# Patient Record
Sex: Female | Born: 1960 | Race: White | Hispanic: No | Marital: Married | Smoking: Never smoker
Health system: Southern US, Community
[De-identification: ages and names within clinical notes are randomized; demographics above are authoritative.]

## PROBLEM LIST (undated history)

## (undated) DIAGNOSIS — E079 Disorder of thyroid, unspecified: Secondary | ICD-10-CM

## (undated) DIAGNOSIS — T4145XA Adverse effect of unspecified anesthetic, initial encounter: Secondary | ICD-10-CM

## (undated) DIAGNOSIS — E039 Hypothyroidism, unspecified: Secondary | ICD-10-CM

## (undated) DIAGNOSIS — E785 Hyperlipidemia, unspecified: Secondary | ICD-10-CM

## (undated) DIAGNOSIS — G5600 Carpal tunnel syndrome, unspecified upper limb: Secondary | ICD-10-CM

## (undated) DIAGNOSIS — T8859XA Other complications of anesthesia, initial encounter: Secondary | ICD-10-CM

## (undated) DIAGNOSIS — M5020 Other cervical disc displacement, unspecified cervical region: Secondary | ICD-10-CM

## (undated) DIAGNOSIS — G47419 Narcolepsy without cataplexy: Secondary | ICD-10-CM

## (undated) HISTORY — PX: MM BREAST STEREO BX*R*R/S: HXRAD496

## (undated) HISTORY — PX: SHOULDER SURGERY: SHX246

## (undated) HISTORY — PX: REDUCTION MAMMAPLASTY: SUR839

## (undated) HISTORY — PX: BREAST BIOPSY: SHX20

## (undated) HISTORY — DX: Hypothyroidism, unspecified: E03.9

## (undated) HISTORY — DX: Hyperlipidemia, unspecified: E78.5

## (undated) HISTORY — DX: Other cervical disc displacement, unspecified cervical region: M50.20

## (undated) HISTORY — DX: Narcolepsy without cataplexy: G47.419

## (undated) HISTORY — DX: Carpal tunnel syndrome, unspecified upper limb: G56.00

## (undated) HISTORY — PX: CERVICAL FUSION: SHX112

---

## 1999-07-07 ENCOUNTER — Other Ambulatory Visit: Admission: RE | Admit: 1999-07-07 | Discharge: 1999-07-07 | Payer: Self-pay | Admitting: Plastic Surgery

## 1999-07-07 ENCOUNTER — Encounter (INDEPENDENT_AMBULATORY_CARE_PROVIDER_SITE_OTHER): Payer: Self-pay

## 2000-04-26 ENCOUNTER — Other Ambulatory Visit: Admission: RE | Admit: 2000-04-26 | Discharge: 2000-04-26 | Payer: Self-pay | Admitting: Gynecology

## 2000-05-14 ENCOUNTER — Other Ambulatory Visit: Admission: RE | Admit: 2000-05-14 | Discharge: 2000-05-14 | Payer: Self-pay | Admitting: Gynecology

## 2000-05-14 ENCOUNTER — Encounter (INDEPENDENT_AMBULATORY_CARE_PROVIDER_SITE_OTHER): Payer: Self-pay

## 2001-08-19 ENCOUNTER — Emergency Department (HOSPITAL_COMMUNITY): Admission: EM | Admit: 2001-08-19 | Discharge: 2001-08-19 | Payer: Self-pay | Admitting: Emergency Medicine

## 2002-11-30 ENCOUNTER — Encounter: Admission: RE | Admit: 2002-11-30 | Discharge: 2002-11-30 | Payer: Self-pay | Admitting: Gynecology

## 2003-03-02 ENCOUNTER — Emergency Department (HOSPITAL_COMMUNITY): Admission: EM | Admit: 2003-03-02 | Discharge: 2003-03-02 | Payer: Self-pay | Admitting: Emergency Medicine

## 2005-07-17 ENCOUNTER — Emergency Department (HOSPITAL_COMMUNITY): Admission: EM | Admit: 2005-07-17 | Discharge: 2005-07-17 | Payer: Self-pay | Admitting: Family Medicine

## 2007-05-05 ENCOUNTER — Ambulatory Visit (HOSPITAL_COMMUNITY): Admission: RE | Admit: 2007-05-05 | Discharge: 2007-05-05 | Payer: Self-pay | Admitting: Orthopedic Surgery

## 2007-06-03 ENCOUNTER — Encounter: Admission: RE | Admit: 2007-06-03 | Discharge: 2007-06-03 | Payer: Self-pay | Admitting: Internal Medicine

## 2007-08-13 ENCOUNTER — Emergency Department (HOSPITAL_COMMUNITY): Admission: EM | Admit: 2007-08-13 | Discharge: 2007-08-13 | Payer: Self-pay | Admitting: Family Medicine

## 2007-09-15 ENCOUNTER — Ambulatory Visit (HOSPITAL_BASED_OUTPATIENT_CLINIC_OR_DEPARTMENT_OTHER): Admission: RE | Admit: 2007-09-15 | Discharge: 2007-09-15 | Payer: Self-pay | Admitting: Orthopedic Surgery

## 2007-09-20 ENCOUNTER — Encounter: Admission: RE | Admit: 2007-09-20 | Discharge: 2007-10-18 | Payer: Self-pay | Admitting: Orthopedic Surgery

## 2007-11-30 ENCOUNTER — Encounter: Admission: RE | Admit: 2007-11-30 | Discharge: 2008-01-16 | Payer: Self-pay | Admitting: Sports Medicine

## 2007-12-06 ENCOUNTER — Ambulatory Visit (HOSPITAL_COMMUNITY): Admission: RE | Admit: 2007-12-06 | Discharge: 2007-12-06 | Payer: Self-pay | Admitting: Sports Medicine

## 2008-01-16 ENCOUNTER — Ambulatory Visit (HOSPITAL_COMMUNITY): Admission: RE | Admit: 2008-01-16 | Discharge: 2008-01-17 | Payer: Self-pay | Admitting: Neurosurgery

## 2008-05-12 ENCOUNTER — Emergency Department (HOSPITAL_COMMUNITY): Admission: EM | Admit: 2008-05-12 | Discharge: 2008-05-12 | Payer: Self-pay | Admitting: Family Medicine

## 2009-01-24 ENCOUNTER — Ambulatory Visit (HOSPITAL_COMMUNITY): Admission: RE | Admit: 2009-01-24 | Discharge: 2009-01-24 | Payer: Self-pay | Admitting: Neurosurgery

## 2009-01-31 ENCOUNTER — Ambulatory Visit (HOSPITAL_COMMUNITY): Admission: RE | Admit: 2009-01-31 | Discharge: 2009-02-01 | Payer: Self-pay | Admitting: Neurosurgery

## 2009-09-09 ENCOUNTER — Observation Stay (HOSPITAL_COMMUNITY): Admission: EM | Admit: 2009-09-09 | Discharge: 2009-09-10 | Payer: Self-pay | Admitting: Emergency Medicine

## 2009-09-09 ENCOUNTER — Emergency Department (HOSPITAL_COMMUNITY): Admission: EM | Admit: 2009-09-09 | Discharge: 2009-09-09 | Payer: Self-pay | Admitting: Family Medicine

## 2009-09-09 ENCOUNTER — Encounter (INDEPENDENT_AMBULATORY_CARE_PROVIDER_SITE_OTHER): Payer: Self-pay | Admitting: General Surgery

## 2009-09-19 HISTORY — PX: APPENDECTOMY: SHX54

## 2010-02-08 ENCOUNTER — Encounter: Payer: Self-pay | Admitting: Gynecology

## 2010-04-03 LAB — DIFFERENTIAL
Basophils Absolute: 0 10*3/uL (ref 0.0–0.1)
Basophils Relative: 0 % (ref 0–1)
Eosinophils Absolute: 0 10*3/uL (ref 0.0–0.7)
Eosinophils Relative: 0 % (ref 0–5)
Lymphocytes Relative: 13 % (ref 12–46)
Lymphs Abs: 2.8 10*3/uL (ref 0.7–4.0)
Monocytes Absolute: 0.9 10*3/uL (ref 0.1–1.0)
Monocytes Relative: 4 % (ref 3–12)
Neutro Abs: 17.8 10*3/uL — ABNORMAL HIGH (ref 1.7–7.7)
Neutrophils Relative %: 83 % — ABNORMAL HIGH (ref 43–77)

## 2010-04-03 LAB — POCT I-STAT, CHEM 8
BUN: 10 mg/dL (ref 6–23)
Calcium, Ion: 1.17 mmol/L (ref 1.12–1.32)
Chloride: 105 mEq/L (ref 96–112)
Creatinine, Ser: 0.8 mg/dL (ref 0.4–1.2)
Glucose, Bld: 158 mg/dL — ABNORMAL HIGH (ref 70–99)
HCT: 50 % — ABNORMAL HIGH (ref 36.0–46.0)
Hemoglobin: 17 g/dL — ABNORMAL HIGH (ref 12.0–15.0)
Potassium: 4 mEq/L (ref 3.5–5.1)
Sodium: 138 mEq/L (ref 135–145)
TCO2: 23 mmol/L (ref 0–100)

## 2010-04-03 LAB — AMYLASE: Amylase: 387 U/L — ABNORMAL HIGH (ref 0–105)

## 2010-04-03 LAB — CBC
HCT: 44.5 % (ref 36.0–46.0)
Hemoglobin: 15.7 g/dL — ABNORMAL HIGH (ref 12.0–15.0)
MCH: 31.3 pg (ref 26.0–34.0)
MCHC: 35.3 g/dL (ref 30.0–36.0)
MCV: 88.6 fL (ref 78.0–100.0)
Platelets: 336 10*3/uL (ref 150–400)
RBC: 5.02 MIL/uL (ref 3.87–5.11)
RDW: 12.8 % (ref 11.5–15.5)
WBC: 21.6 10*3/uL — ABNORMAL HIGH (ref 4.0–10.5)

## 2010-04-03 LAB — POCT URINALYSIS DIPSTICK
Bilirubin Urine: NEGATIVE
Glucose, UA: NEGATIVE mg/dL
Ketones, ur: NEGATIVE mg/dL
Nitrite: NEGATIVE
Protein, ur: NEGATIVE mg/dL
Specific Gravity, Urine: 1.01 (ref 1.005–1.030)
Urobilinogen, UA: 0.2 mg/dL (ref 0.0–1.0)
pH: 6 (ref 5.0–8.0)

## 2010-04-03 LAB — LIPASE, BLOOD: Lipase: 23 U/L (ref 11–59)

## 2010-04-03 LAB — POCT PREGNANCY, URINE: Preg Test, Ur: NEGATIVE

## 2010-04-06 LAB — CBC
HCT: 40.5 % (ref 36.0–46.0)
Hemoglobin: 14.1 g/dL (ref 12.0–15.0)
MCHC: 34.9 g/dL (ref 30.0–36.0)
MCV: 90.3 fL (ref 78.0–100.0)
Platelets: 283 10*3/uL (ref 150–400)
RBC: 4.48 MIL/uL (ref 3.87–5.11)
RDW: 13.1 % (ref 11.5–15.5)
WBC: 10.6 10*3/uL — ABNORMAL HIGH (ref 4.0–10.5)

## 2010-06-03 NOTE — Op Note (Signed)
Alice Jones, Alice Jones            ACCOUNT NO.:  [id number]   MEDICAL RECORD NO.:  [id number]          PATIENT TYPE:  AMB   LOCATION:  DSC                          FACILITY:  [facility]   PHYSICIAN:  Loreta Ave, M.D. DATE OF BIRTH:  08-23-1960   DATE OF PROCEDURE:  09/15/2007  DATE OF DISCHARGE:                               OPERATIVE REPORT   PREOPERATIVE DIAGNOSES:  Right shoulder impingement, distal clavicle  osteolysis.   POSTOPERATIVE DIAGNOSES:  Right shoulder impingement, distal clavicle  osteolysis with anterior labrum tear and partial tearing of  supraspinatus tendon crescent region above and below.  No full-thickness  tears.   PROCEDURE:  Right shoulder exam under anesthesia, arthroscopy,  debridement of labrum and rotator cuff, bursectomy, acromioplasty, CA  ligament release.  Excision of distal clavicle.   SURGEON:  Loreta Ave, MD   ASSISTANT:  Genene Churn. Barry Dienes, [place]   ANESTHESIA:  General.   BLOOD LOSS:  Minimal.   SPECIMENS:  None.   CULTURES:  None.   COMPLICATIONS:  None.   DRESSING:  Soft compressive with sling.   PROCEDURE:  The patient was brought to the operating room and placed on  operating table in supine position.  After adequate anesthesia had been  obtained, shoulder examined.  Full motion and good stability.  Noted to  have a mobile lipoma in the axillary region.  Placed in beach-chair  position and the shoulder positioner, prepped and draped in usual  sterile fashion.  Three portals anterior, posterior, and lateral.  Shoulder entered with blunt obturator.  Arthroscope introduced.  The  shoulder distended and inspected.  Fair amount of partial tearing  undersurface supraspinatus tendon crescent region mostly in the front.  Debrided.  Some thinning in that area, but no full-thickness tears.  Cable well anchored.  Little fraying on the top of subscap bilaterally  debrided.  Biceps tendon, biceps anchor, and capsule ligamentous  structures  intact.  Fraying tearing at the  ligament debrided down  smoothly.  No other significant degenerative changes.  Cannula  redirected subacromially.  A lot of abrasion on top of the cuff  supraspinatus all debrided.  Again, no full-thickness tears.  Type 3  acromion.  Grade 4 changes of AC joint with spurring.  Bursa resected.  Acromioplasty to a type 1 acromion with shaver and high-speed bur.  CA  ligament released with cautery.  Distal clavicle exposed.  Lateral  centimeter of clavicle and all periarticular spurs removed.  At completion, adequacy of decompression, cuff debridement, clavicle  excision confirmed viewing from all portals.  Instruments and fluid  removed.  Portals all closed with nylon.  Sterile compressive dressing  applied.  Sling applied.  Anesthesia reversed.  Brought to the room.  Tolerated the surgery well.  No complications.      Loreta Ave, M.D.  Electronically Signed     DFM/MEDQ  D:  09/15/2007  T:  09/16/2007  Job:  [id number]

## 2010-06-03 NOTE — Op Note (Signed)
Alice Jones, Alice Jones            ACCOUNT NO.:  [id number]   MEDICAL RECORD NO.:  [id number]          PATIENT TYPE:  OIB   LOCATION:  3537                         FACILITY:  [facility]   PHYSICIAN:  Hewitt Shorts, M.D.DATE OF BIRTH:  1960-03-16   DATE OF PROCEDURE:  01/16/2008  DATE OF DISCHARGE:                               OPERATIVE REPORT   PREOPERATIVE DIAGNOSES:  C5-C6 and C6-C7 cervical disk herniation,  cervical spondylosis, cervical degenerative disk disease with cervical  radiculopathy.   POSTOPERATIVE DIAGNOSES:  C5-C6 and C6-C7 cervical disk herniation,  cervical spondylosis, cervical degenerative disk disease with cervical  radiculopathy.   PROCEDURE:  C5-C6 and C6-C7 anterior cervical decompression and  arthrodesis with allograft and Tether cervical plating.   SURGEON:  Hewitt Shorts, MD.   ASSISTANT:  1. Nelia Shi. Webb Silversmith, RN  2. Coletta Memos, MD   ANESTHESIA:  General endotracheal.   INDICATIONS:  The patient is a 50 year old woman who presented with a  right cervical radiculopathy.  MRI revealed significant degenerative  disk disease followed with C5-C6 and C6-C7 with a small spinal spur to  the right at C5-6 with superimposed soft disk herniation and a large  broad-based spinal disk herniation at C6-7 with moderate stenosis, worst  to the right than to the left.  A decision was made to proceed with 2-  level anterior cervical decompression and arthrodesis.   PROCEDURE:  The patient was brought to the operating room and placed  under general endotracheal anesthesia.  The patient was placed in 10  pounds of Holter traction.  The neck was prepped with Betadine soap and  solution and draped in sterile fashion.  A horizontal incision was made  in the left side of the neck.  The line of the incision was infiltrated  with local anesthetic with epinephrine and incision was made, carried  down through the subcutaneous tissue with bipolar electrocautery.  Electrocautery was used to maintain hemostasis.  Dissection was carried  down to the subcutaneous tissue and platysma and then dissection was  carried out through an avascular plane leaving the sternocleidomastoid,  carotid artery, and jugular vein laterally and the trachea and esophagus  medially.  The ventral aspect of the vertebral column was identified and  x-rays were taken and the C5-C6 and C6-C7 intervertebral disk spaces  identified.  Diskectomy was begun with incision of the annulus continued  with microcurettes and pituitary rongeurs.  Anterior osteophytic  overgrowth was removed using the osteophyte removal tool, the X-Max  drill, and Kerrison punches.  The degenerated disk material was removed  using microcurettes and pituitary rongeurs. The microscope was draped  and brought into the field to provide additional navigation,  illumination, and visualization.  The remainder of the decompression  performed using microdissection and microsurgical technique. The  cauterized endplates of the vertebral bodies were removed using  microcurettes along with the X-Max drill and then posterior osteophytic  overgrowth was removed using the X-Max drill along with a 2-mm Kerrison  punch with the thin foot plates.  There was significant spinal  overgrowth and as this was removed, we were able to  decompress the  spinal canal and thecal sac.  We then turned our attention to the  neuroforamina.  There was spinal encroachment of the neuroforamina  bilaterally at each level, and this was carefully removed and as this  was done, we encountered the soft disk herniation to the right at C5-C6.  This was removed as well and we were able to decompress the thecal sac  and spinal canal as well as the neuroforamina and nerve roots  bilaterally at each level.  Once the decompression was completed,  hemostasis was established with the use of Gelfoam soaked in thrombin.  We then measured the height of the  intervertebral disk spaces and  selected two 7-mm autograft implants.  They were hydrated with saline  solutions and positioned in the intervertebral disk spaces and  countersunk.  The cervical traction was then discontinued.  We selected  a 32-mm Tether cervical plate.  It was positioned over fusion construct  and secured to the vertebra with 4- x 14-mm variable angle screws.  Each  of the screw holes was drilled.  Screws placed in alternating fashion.  Once all 5 screws were in place, final tightening was performed.  The  wound was irrigated with Bacitracin solution and checked for hemostasis,  which was established and confirmed and x-ray was taken which showed the  plate and screws in good position at the C5-C6 level along with bone  graft at the C5-C6 level.  The C6-7 level could not be well visualized  due to her shoulders.  Nonetheless, under direct visualization, the bone  graft and screws were in good position.  Once the hemostasis was  confirmed, we proceeded with closure.  The platysma was closed with  interrupted inverted 2-0 undyed Vicryl sutures.  The subcutaneous and  subcuticular were closed with interrupted inverted 3-0 undyed Vicryl  sutures.  The skin was re-approximated with Dermabond.  The procedure  was tolerated well.  Estimated blood loss was 25 mL.  Sponge and needle  count were correct.  Following surgery, the patient was reversed from  the anesthetic, extubated, and transferred to the recovery room for  further care.      Hewitt Shorts, M.D.  Electronically Signed     RWN/MEDQ  D:  01/16/2008  T:  01/17/2008  Job:  [id number]

## 2010-06-27 ENCOUNTER — Inpatient Hospital Stay (INDEPENDENT_AMBULATORY_CARE_PROVIDER_SITE_OTHER)
Admission: RE | Admit: 2010-06-27 | Discharge: 2010-06-27 | Disposition: A | Discharge status: Home / Self Care | Payer: 59 | Source: Ambulatory Visit | Attending: Emergency Medicine | Admitting: Emergency Medicine

## 2010-06-27 DIAGNOSIS — R112 Nausea with vomiting, unspecified: Secondary | ICD-10-CM

## 2010-06-27 LAB — POCT URINALYSIS DIP (DEVICE)
Bilirubin Urine: NEGATIVE
Glucose, UA: NEGATIVE mg/dL
Hgb urine dipstick: NEGATIVE
Ketones, ur: 15 mg/dL — AB
Leukocytes, UA: NEGATIVE
Nitrite: NEGATIVE
Protein, ur: NEGATIVE mg/dL
Specific Gravity, Urine: >=1.03 (ref 1.005–1.030)
Urobilinogen, UA: 0.2 mg/dL (ref 0.0–1.0)
pH: 5.5 (ref 5.0–8.0)

## 2010-06-27 LAB — POCT I-STAT, CHEM 8
BUN: 18 mg/dL (ref 6–23)
Calcium, Ion: 1.21 mmol/L (ref 1.12–1.32)
Chloride: 103 mEq/L (ref 96–112)
Creatinine, Ser: 1 mg/dL (ref 0.4–1.2)
Glucose, Bld: 109 mg/dL — ABNORMAL HIGH (ref 70–99)
HCT: 45 % (ref 36.0–46.0)
Hemoglobin: 15.3 g/dL — ABNORMAL HIGH (ref 12.0–15.0)
Potassium: 4 mEq/L (ref 3.5–5.1)
Sodium: 137 mEq/L (ref 135–145)
TCO2: 24 mmol/L (ref 0–100)

## 2010-07-01 ENCOUNTER — Ambulatory Visit (INDEPENDENT_AMBULATORY_CARE_PROVIDER_SITE_OTHER): Payer: 59 | Admitting: Internal Medicine

## 2010-07-01 DIAGNOSIS — E669 Obesity, unspecified: Secondary | ICD-10-CM

## 2010-07-01 DIAGNOSIS — E785 Hyperlipidemia, unspecified: Secondary | ICD-10-CM

## 2010-07-07 ENCOUNTER — Encounter: Payer: 59 | Attending: Endocrinology

## 2010-07-10 ENCOUNTER — Encounter: Payer: Self-pay | Admitting: Internal Medicine

## 2010-07-22 ENCOUNTER — Ambulatory Visit (INDEPENDENT_AMBULATORY_CARE_PROVIDER_SITE_OTHER): Payer: 59 | Admitting: Internal Medicine

## 2010-07-22 ENCOUNTER — Ambulatory Visit (HOSPITAL_BASED_OUTPATIENT_CLINIC_OR_DEPARTMENT_OTHER)
Admission: RE | Admit: 2010-07-22 | Discharge: 2010-07-22 | Disposition: A | Discharge status: Home / Self Care | Payer: 59 | Source: Ambulatory Visit | Attending: Internal Medicine | Admitting: Internal Medicine

## 2010-07-22 ENCOUNTER — Other Ambulatory Visit: Payer: Self-pay | Admitting: Internal Medicine

## 2010-07-22 ENCOUNTER — Other Ambulatory Visit (HOSPITAL_COMMUNITY)
Admission: RE | Admit: 2010-07-22 | Discharge: 2010-07-22 | Disposition: A | Discharge status: Home / Self Care | Payer: 59 | Source: Ambulatory Visit | Attending: Internal Medicine | Admitting: Internal Medicine

## 2010-07-22 DIAGNOSIS — Z23 Encounter for immunization: Secondary | ICD-10-CM

## 2010-07-22 DIAGNOSIS — Z1231 Encounter for screening mammogram for malignant neoplasm of breast: Secondary | ICD-10-CM | POA: Insufficient documentation

## 2010-07-22 DIAGNOSIS — Z01419 Encounter for gynecological examination (general) (routine) without abnormal findings: Secondary | ICD-10-CM | POA: Insufficient documentation

## 2010-07-22 DIAGNOSIS — IMO0001 Reserved for inherently not codable concepts without codable children: Secondary | ICD-10-CM

## 2010-07-22 DIAGNOSIS — J309 Allergic rhinitis, unspecified: Secondary | ICD-10-CM

## 2010-07-22 DIAGNOSIS — Z113 Encounter for screening for infections with a predominantly sexual mode of transmission: Secondary | ICD-10-CM

## 2010-07-22 DIAGNOSIS — Z1159 Encounter for screening for other viral diseases: Secondary | ICD-10-CM | POA: Insufficient documentation

## 2010-07-22 DIAGNOSIS — Z1272 Encounter for screening for malignant neoplasm of vagina: Secondary | ICD-10-CM

## 2010-07-29 ENCOUNTER — Encounter: Payer: 59 | Attending: Endocrinology

## 2010-07-30 NOTE — Progress Notes (Signed)
  Patient was seen on 07/29/10 for the second of a series of three diabetes self-management courses at the [facility]. The following learning objectives were met by the patient during this course:   States the relationship of exercise to blood glucose  States benefits/barriers of regular and safe exercise  States three guidelines for safe and effective exercise  Describes personal diabetes medicine regimen  Describes actions of own medications  Describes causes, symptoms, and treatment of hypo/hyperglycemia  Describes sick day rules  Identifies when to test urine for ketones when appropriate  Identifies when to call healthcare provider for acute complications  States the risk for problems with foot, skin, and dental care  States preventative foot, skin, and dental care measures  States when to call healthcare provider regarding foot, skin, and dental care  Identifies methods for evaluation of diabetes control  Discusses benefits of SBGM  Identifies relationship between nutrition, exercise, medication, and glucose levels  Discusses the importance of record keeping  Follow-Up Plan: Patient will attend the final class of the ADA Diabetes Self-Care Education.

## 2010-08-06 NOTE — Progress Notes (Signed)
  Patient was seen on 08/05/2010 for the third of a series of three diabetes self-management courses at the [facility]. The following learning objectives were met by the patient during this course:   Identifies nutrient effects on glycemia  States the general guidelines of meal planning  Relates understanding of personal meal plan  Describes situations that cause stress and discuss methods of stress management  Identifies lifestyle behaviors for change  Your patient has established the following 3 month goal for diabetes self-care:  Walk /bike/swim/Aerobic exercise 4-5 times per week for 30 minutes  Follow-Up Plan: Patient will attend a free 3 month follow-up visit for diabetes self-management education.

## 2010-09-10 ENCOUNTER — Other Ambulatory Visit: Payer: Self-pay | Admitting: Internal Medicine

## 2010-10-24 LAB — CBC
HCT: 41.4 % (ref 36.0–46.0)
Hemoglobin: 14.2 g/dL (ref 12.0–15.0)
MCHC: 34.3 g/dL (ref 30.0–36.0)
MCV: 89.9 fL (ref 78.0–100.0)
Platelets: 332 10*3/uL (ref 150–400)
RBC: 4.6 MIL/uL (ref 3.87–5.11)
RDW: 13.1 % (ref 11.5–15.5)
WBC: 11.4 10*3/uL — ABNORMAL HIGH (ref 4.0–10.5)

## 2010-11-06 ENCOUNTER — Ambulatory Visit: Payer: 59 | Admitting: Internal Medicine

## 2010-11-13 ENCOUNTER — Ambulatory Visit (INDEPENDENT_AMBULATORY_CARE_PROVIDER_SITE_OTHER): Payer: 59 | Admitting: Internal Medicine

## 2010-11-13 ENCOUNTER — Encounter: Payer: Self-pay | Admitting: Internal Medicine

## 2010-11-13 DIAGNOSIS — Z9889 Other specified postprocedural states: Secondary | ICD-10-CM | POA: Insufficient documentation

## 2010-11-13 DIAGNOSIS — M5020 Other cervical disc displacement, unspecified cervical region: Secondary | ICD-10-CM | POA: Insufficient documentation

## 2010-11-13 DIAGNOSIS — H409 Unspecified glaucoma: Secondary | ICD-10-CM

## 2010-11-13 DIAGNOSIS — J309 Allergic rhinitis, unspecified: Secondary | ICD-10-CM

## 2010-11-13 DIAGNOSIS — H4020X0 Unspecified primary angle-closure glaucoma, stage unspecified: Secondary | ICD-10-CM

## 2010-11-13 DIAGNOSIS — E785 Hyperlipidemia, unspecified: Secondary | ICD-10-CM | POA: Insufficient documentation

## 2010-11-13 DIAGNOSIS — E119 Type 2 diabetes mellitus without complications: Secondary | ICD-10-CM | POA: Insufficient documentation

## 2010-11-13 MED ORDER — MOMETASONE FUROATE 50 MCG/ACT NA SUSP: NASAL | Status: DC

## 2010-11-13 NOTE — Patient Instructions (Signed)
Take nasonex daily for the next 2 months  Return prn

## 2010-11-13 NOTE — Progress Notes (Signed)
Subjective:    Patient ID: Alice Jones, female    DOB: Feb 03, 1960, 50 y.o.   MRN: [id number]  HPI  Alice Jones is here for a return visit.  She has seen Dr. Talmage Nap who is controlling her diabetes.  She is on 2000 mg of Metformin a day and highest glucose has been around 163 after a meal.  She has seen Dr. Dione Booze for eye exam and told she had narrow angle glaucoma.    Her allergies have been bothering her this fall and she is having some posterior pharyngeal drainage.  No sore throat or ear pain .      Allergies  Allergen Reactions  . Victoza     nausea   Past Medical History  Diagnosis Date  . Herniated cervical disc   . Allergic rhinitis   . Diabetes mellitus     Dr. Talmage Nap  . Hyperlipemia    Past Surgical History  Procedure Date  . Shoulder surgery     Right  . Appendectomy 09/2009  . Cervical fusion 12/09, 12/10    C6/7, C5/6   History   Social History  . Marital Status: Single    Spouse Name: N/A    Number of Children: 1  . Years of Education: law degree   Occupational History  . lawyer    Social History Main Topics  . Smoking status: Never Smoker   . Smokeless tobacco: Not on file  . Alcohol Use: No  . Drug Use: No  . Sexually Active: Yes -- Female partner(s)   Other Topics Concern  . Not on file   Social History Narrative  . No narrative on file   Family History  Problem Relation Age of Onset  . Cancer Mother     tongue  . Diabetes Maternal Grandfather    Patient Active Problem List  Diagnoses  . Allergic rhinitis  . Diabetes mellitus  . Hyperlipemia  . Herniated cervical disc  . Glaucoma, narrow-angle   Current Outpatient Prescriptions on File Prior to Visit  Medication Sig Dispense Refill  . diphenhydrAMINE (BENADRYL) 25 MG tablet Take 25 mg by mouth at bedtime.        . metFORMIN (GLUCOPHAGE) 1000 MG tablet Take 1,000 mg by mouth 2 (two) times daily with a meal.        . mometasone (NASONEX) 50 MCG/ACT nasal spray Spray once into each  nostril daily  17 g  1  . montelukast (SINGULAIR) 10 MG tablet Take 10 mg by mouth at bedtime.          Review of Systems    see hpi Objective:   Physical Exam  Physical Exam  Nursing note and vitals reviewed.  Constitutional: She is oriented to person, place, and time. She appears well-developed and well-nourished.  HENT: TM  No effusions  Oropharynx no lesions Head: Normocephalic and atraumatic.  Cardiovascular: Normal rate and regular rhythm. Exam reveals no gallop and no friction rub.  No murmur heard.  Pulmonary/Chest: Breath sounds normal. She has no wheezes. She has no rales.  Neurological: She is alert and oriented to person, place, and time.  Skin: Skin is warm and dry.  Psychiatric: She has a normal mood and affect. Her behavior is normal.         Assessment & Plan:  1)  Allergic rhinitis:  Will refill nasonex 2)  DM  She declines flu vaccine today despite my recommending it  Especially in setting or diabetes.  Diabetes care Dr. Talmage Nap  Has seen Dr. Dione Booze 3)  Mild hyperlipidemia  Low fat diet. 4)  Narrow angle glaucoma   Dr. Dione Booze

## 2011-05-21 ENCOUNTER — Other Ambulatory Visit: Payer: Self-pay | Admitting: Internal Medicine

## 2011-07-06 ENCOUNTER — Other Ambulatory Visit: Payer: Self-pay | Admitting: *Deleted

## 2011-07-07 MED ORDER — MOMETASONE FUROATE 50 MCG/ACT NA SUSP: 1.0000 | Freq: Every day | NASAL | Status: DC

## 2011-08-29 ENCOUNTER — Emergency Department (INDEPENDENT_AMBULATORY_CARE_PROVIDER_SITE_OTHER)
Admission: EM | Admit: 2011-08-29 | Discharge: 2011-08-29 | Disposition: A | Discharge status: Home / Self Care | Payer: 59 | Source: Home / Self Care

## 2011-08-29 DIAGNOSIS — R42 Dizziness and giddiness: Secondary | ICD-10-CM

## 2011-08-29 HISTORY — DX: Disorder of thyroid, unspecified: E07.9

## 2011-08-29 MED ORDER — PROMETHAZINE HCL 25 MG PO TABS: 25.0000 mg | ORAL_TABLET | Freq: Four times a day (QID) | ORAL | Status: DC | PRN

## 2011-08-29 MED ORDER — MECLIZINE HCL 25 MG PO CHEW: 25.0000 mg | CHEWABLE_TABLET | Freq: Four times a day (QID) | ORAL | Status: DC | PRN

## 2011-08-29 MED ORDER — PROMETHAZINE HCL 25 MG/ML IJ SOLN
25.0000 mg | Freq: Four times a day (QID) | INTRAMUSCULAR | Status: DC | PRN
  Administered 2011-08-29: 25 mg via INTRAMUSCULAR

## 2011-08-29 NOTE — ED Notes (Signed)
Dizziness and nausea started yesterday without out relief of nausea from Zofran ODT

## 2011-08-29 NOTE — ED Provider Notes (Signed)
History     CSN: [id number]  Arrival date & time 08/29/11  6295   First MD Initiated Contact with Patient 08/29/11 1005      Chief Complaint  Patient presents with  . Dizziness  . Nausea  HPI Comments: Dizziness x 2 days Pt noticed feeling of persistent dizziness since yesterday evening.  Dizziness independent of position.  Has had feeling of room spinning. + Nausea and vomiting with episoded.  Emesis NBNB.  No abd pain, diarrhea, dysuria, increased urinary frequency.   No CP, palpitations, SOB, fever.  No recent infections or trauma.  No hearing loss.  This has never happened before.  No hemiparesis, confusion, slurred speech.  Pt states that she started synthroid 1 week ago.  Has not had any issues with medication thus far.     Past Medical History  Diagnosis Date  . Herniated cervical disc   . Allergic rhinitis   . Diabetes mellitus     Dr. Talmage Nap  . Hyperlipemia   . Thyroid disease     Past Surgical History  Procedure Date  . Shoulder surgery     Right  . Appendectomy 09/2009  . Cervical fusion 12/09, 12/10    C6/7, C5/6    Family History  Problem Relation Age of Onset  . Cancer Mother     tongue  . Diabetes Maternal Grandfather     History  Substance Use Topics  . Smoking status: Never Smoker   . Smokeless tobacco: Not on file  . Alcohol Use: No    OB History    Grav Para Term Preterm Abortions TAB SAB Ect Mult Living   1    1  1         Obstetric Comments   1 Adopted child      Review of Systems  All other systems reviewed and are negative.    Allergies  Liraglutide  Home Medications   Current Outpatient Rx  Name Route Sig Dispense Refill  . DIPHENHYDRAMINE HCL 25 MG PO TABS Oral Take 25 mg by mouth at bedtime.      Marland Kitchen METFORMIN HCL 1000 MG PO TABS Oral Take 1,000 mg by mouth 2 (two) times daily with a meal.      . MOMETASONE FUROATE 50 MCG/ACT NA SUSP Nasal Place 1 spray into the nose daily. 17 g 1  . MONTELUKAST SODIUM 10 MG PO  TABS Oral Take 10 mg by mouth at bedtime.        BP 108/74  Pulse 83  Temp 97.8 F (36.6 C) (Oral)  Resp 20  Ht 5\' 2"  (1.575 m)  Wt 160 lb 8 oz (72.802 kg)  BMI 29.36 kg/m2  SpO2 98%  Physical Exam  Constitutional: She appears well-developed and well-nourished.  HENT:  Head: Normocephalic and atraumatic.  Right Ear: External ear normal.  Left Ear: External ear normal.  Mouth/Throat: Oropharynx is clear and moist.  Eyes: Conjunctivae are normal. Pupils are equal, round, and reactive to light.  Neck: Normal range of motion. Neck supple.  Cardiovascular: Normal rate and regular rhythm.   Pulmonary/Chest: Effort normal and breath sounds normal.  Abdominal: Soft. Bowel sounds are normal.  Musculoskeletal: Normal range of motion.  Lymphadenopathy:    She has no cervical adenopathy.  Neurological: No cranial nerve deficit.       + horizontal nystagmus Dix hallpike positive.   Skin: Skin is warm.    ED Course  Procedures (including critical care time)  Labs Reviewed - No  data to display No results found.   1. Vertigo       MDM  Phenergan 25 IM x 1 for active vomiting.  Will rx meclizine.  Discussed general disease course and neuro red flags.  If sxs not improved in 24 hours, may consider increase in medication vs. Head CT to rule out intracranial etiology.  Handout given.      The patient and/or caregiver has been counseled thoroughly with regard to treatment plan and/or medications prescribed including dosage, schedule, interactions, rationale for use, and possible side effects and they verbalize understanding. Diagnoses and expected course of recovery discussed and will return if not improved as expected or if the condition worsens. Patient and/or caregiver verbalized understanding.             Floydene Flock, MD 08/29/11 1029

## 2011-09-01 ENCOUNTER — Ambulatory Visit (INDEPENDENT_AMBULATORY_CARE_PROVIDER_SITE_OTHER): Payer: 59 | Admitting: Internal Medicine

## 2011-09-01 ENCOUNTER — Encounter: Payer: Self-pay | Admitting: Internal Medicine

## 2011-09-01 VITALS — BP 138/84 | HR 73 | Temp 96.9°F | Resp 16 | Ht 62.0 in | Wt 161.0 lb

## 2011-09-01 DIAGNOSIS — R270 Ataxia, unspecified: Secondary | ICD-10-CM

## 2011-09-01 DIAGNOSIS — F29 Unspecified psychosis not due to a substance or known physiological condition: Secondary | ICD-10-CM

## 2011-09-01 DIAGNOSIS — R410 Disorientation, unspecified: Secondary | ICD-10-CM

## 2011-09-01 DIAGNOSIS — R42 Dizziness and giddiness: Secondary | ICD-10-CM

## 2011-09-01 DIAGNOSIS — R279 Unspecified lack of coordination: Secondary | ICD-10-CM

## 2011-09-01 MED ORDER — ONDANSETRON HCL 4 MG PO TABS: 4.0000 mg | ORAL_TABLET | Freq: Three times a day (TID) | ORAL | Status: AC | PRN

## 2011-09-01 NOTE — Progress Notes (Signed)
Subjective:    Patient ID: Alice Jones, female    DOB: 05-08-1960, 51 y.o.   MRN: [id number]  HPI   Marissa is here for acute visit.  She reports she had onset of dizziness and vomiting  4 days ago and was seen at Urgent Care dx with vertigo and given  antivert and phenergan.  Vomiting ceased but still has "wobbly" feeling when she walks and some nausea.  She does note Antivert makes her drowsy.    No visual changes, no slurred speech, no muscle weakness.  Still feels some disorientation at times recently.    Tingling limited to  hands only that she feels is her carpal tunnel.  She is concerned about confused thinking, and difficulty with word finding that began several months ago.     No chest tightness or pain, no Sob or palpitations.    Allergies  Allergen Reactions  . Liraglutide     nausea   Past Medical History  Diagnosis Date  . Herniated cervical disc   . Allergic rhinitis   . Diabetes mellitus     Dr. Talmage Nap  . Hyperlipemia   . Thyroid disease   . Hypothyroid   . Carpal tunnel syndrome    Past Surgical History  Procedure Date  . Shoulder surgery     Right  . Appendectomy 09/2009  . Cervical fusion 12/09, 12/10    C6/7, C5/6   History   Social History  . Marital Status: Single    Spouse Name: N/A    Number of Children: 1  . Years of Education: law degree   Occupational History  . lawyer    Social History Main Topics  . Smoking status: Never Smoker   . Smokeless tobacco: Not on file  . Alcohol Use: No  . Drug Use: No  . Sexually Active: Yes -- Female partner(s)   Other Topics Concern  . Not on file   Social History Narrative  . No narrative on file   Family History  Problem Relation Age of Onset  . Cancer Mother     tongue  . Diabetes Maternal Grandfather    Patient Active Problem List  Diagnosis  . Allergic rhinitis  . Diabetes mellitus  . Hyperlipemia  . Herniated cervical disc  . Glaucoma, narrow-angle   Current Outpatient  Prescriptions on File Prior to Visit  Medication Sig Dispense Refill  . diphenhydrAMINE (BENADRYL) 25 MG tablet Take 25 mg by mouth at bedtime.        Marland Kitchen levothyroxine (SYNTHROID, LEVOTHROID) 50 MCG tablet Take 50 mcg by mouth daily.      . Meclizine HCl 25 MG CHEW Chew 1 tablet (25 mg total) by mouth every 6 (six) hours as needed.  90 each  0  . metFORMIN (GLUCOPHAGE) 1000 MG tablet Take 1,000 mg by mouth 2 (two) times daily with a meal.        . mometasone (NASONEX) 50 MCG/ACT nasal spray Place 1 spray into the nose daily.  17 g  1  . promethazine (PHENERGAN) 25 MG tablet Take 1 tablet (25 mg total) by mouth every 6 (six) hours as needed for nausea.  30 tablet  0       Assessment & Plan:   ba  Review of Systems see HPI   Objective:   Physical Exam Physical Exam  Nursing note and vitals reviewed.  Constitutional: She is oriented to person, place, and time. She appears well-developed and well-nourished.  HENT: Fundi  No  papilledema Head: Normocephalic and atraumatic.  Cardiovascular: Normal rate and regular rhythm. Exam reveals no gallop and no friction rub.  No murmur heard.  Pulmonary/Chest: Breath sounds normal. She has no wheezes. She has no rales.  Neurological: She is alert and oriented to person, place, and time. Romberg neg. CN II-XII intact  No nystagmus Motor 5/5 upper and Lower extremities.  No pronator drift Cerebellar  Intact to FTN   HTS Reflexes 2+ symmetric Babinski neg Sensory intact  Skin: Skin is warm and dry.  Psychiatric: She has a normal mood and affect. Her behavior is normal.        Assessment & Plan:  Dizziness/ataxia/confusion:   will get Head CT with contast  Check chemistries CBC today  Presumed Vertigo:  See above  Very sensitive to drowsiness of meds Ok to take Antiver 12.5 mg q8h prn  Nausea  Rx Zofran 4 mg q8h prn nausea

## 2011-09-01 NOTE — Patient Instructions (Addendum)
To have head Ct scheduled  Will need to check kidney function today  Any worsening problems go to er for xrays

## 2011-09-02 ENCOUNTER — Ambulatory Visit (HOSPITAL_COMMUNITY)
Admission: RE | Admit: 2011-09-02 | Discharge: 2011-09-02 | Disposition: A | Discharge status: Home / Self Care | Payer: 59 | Source: Ambulatory Visit | Attending: Internal Medicine | Admitting: Internal Medicine

## 2011-09-02 ENCOUNTER — Telehealth: Payer: Self-pay | Admitting: Internal Medicine

## 2011-09-02 ENCOUNTER — Telehealth: Payer: Self-pay | Admitting: *Deleted

## 2011-09-02 ENCOUNTER — Other Ambulatory Visit: Payer: Self-pay | Admitting: Internal Medicine

## 2011-09-02 DIAGNOSIS — R410 Disorientation, unspecified: Secondary | ICD-10-CM

## 2011-09-02 DIAGNOSIS — F29 Unspecified psychosis not due to a substance or known physiological condition: Secondary | ICD-10-CM | POA: Insufficient documentation

## 2011-09-02 DIAGNOSIS — R42 Dizziness and giddiness: Secondary | ICD-10-CM

## 2011-09-02 DIAGNOSIS — R270 Ataxia, unspecified: Secondary | ICD-10-CM

## 2011-09-02 DIAGNOSIS — R279 Unspecified lack of coordination: Secondary | ICD-10-CM | POA: Insufficient documentation

## 2011-09-02 LAB — COMPREHENSIVE METABOLIC PANEL
ALT: 16 U/L (ref 0–35)
CO2: 27 mEq/L (ref 19–32)
Calcium: 10 mg/dL (ref 8.4–10.5)
Chloride: 100 mEq/L (ref 96–112)
Potassium: 4.3 mEq/L (ref 3.5–5.3)
Sodium: 136 mEq/L (ref 135–145)
Total Bilirubin: 0.3 mg/dL (ref 0.3–1.2)
Total Protein: 7.1 g/dL (ref 6.0–8.3)

## 2011-09-02 LAB — CBC WITH DIFFERENTIAL/PLATELET
Lymphocytes Relative: 29 % (ref 12–46)
Lymphs Abs: 3.9 10*3/uL (ref 0.7–4.0)
Neutro Abs: 8.3 10*3/uL — ABNORMAL HIGH (ref 1.7–7.7)
Neutrophils Relative %: 63 % (ref 43–77)
Platelets: 356 10*3/uL (ref 150–400)
RBC: 4.97 MIL/uL (ref 3.87–5.11)
WBC: 13.3 10*3/uL — ABNORMAL HIGH (ref 4.0–10.5)

## 2011-09-02 LAB — TSH: TSH: 2.577 u[IU]/mL (ref 0.350–4.500)

## 2011-09-02 MED ORDER — IOHEXOL 300 MG/ML  SOLN
100.0000 mL | Freq: Once | INTRAMUSCULAR | Status: AC | PRN
  Administered 2011-09-02: 100 mL via INTRAVENOUS

## 2011-09-02 NOTE — Telephone Encounter (Signed)
Spoke with pt. Via phone and informed of labs.  Labs significant for mild elevated WBC but pt  Clinically does not point to any infectious etiology.  She denies URI, abd, Gi or GU symptoms.  She is going for CT today.  Advised to call in am for results.  She did go back to work today but still does not feel very well

## 2011-09-02 NOTE — Telephone Encounter (Signed)
Pt scheduled for Head CT with and WO contrast

## 2011-09-03 ENCOUNTER — Telehealth: Payer: Self-pay | Admitting: *Deleted

## 2011-09-03 ENCOUNTER — Telehealth: Payer: Self-pay | Admitting: Internal Medicine

## 2011-09-03 ENCOUNTER — Other Ambulatory Visit (HOSPITAL_COMMUNITY): Payer: 59

## 2011-09-03 DIAGNOSIS — R42 Dizziness and giddiness: Secondary | ICD-10-CM

## 2011-09-03 NOTE — Telephone Encounter (Signed)
Copy  Of pt's labs mailed to her home address.

## 2011-09-03 NOTE — ED Provider Notes (Signed)
Agree with exam, assessment, and plan.   Lattie Haw, MD 09/03/11 [phone]

## 2011-09-03 NOTE — Telephone Encounter (Signed)
Spoke with pt and informed of Head CT results.  She is feeling better today but still would like to discuss with ENT  Will refer to ENT

## 2011-09-15 ENCOUNTER — Encounter: Payer: Self-pay | Admitting: Internal Medicine

## 2011-09-15 DIAGNOSIS — H8120 Vestibular neuronitis, unspecified ear: Secondary | ICD-10-CM | POA: Insufficient documentation

## 2011-09-15 DIAGNOSIS — H8110 Benign paroxysmal vertigo, unspecified ear: Secondary | ICD-10-CM | POA: Insufficient documentation

## 2011-10-23 ENCOUNTER — Other Ambulatory Visit: Payer: Self-pay | Admitting: Internal Medicine

## 2012-01-15 ENCOUNTER — Ambulatory Visit: Payer: 59

## 2012-03-25 ENCOUNTER — Ambulatory Visit (INDEPENDENT_AMBULATORY_CARE_PROVIDER_SITE_OTHER): Payer: Self-pay | Admitting: Family Medicine

## 2012-03-25 VITALS — BP 135/84 | HR 83 | Ht 62.0 in | Wt 166.8 lb

## 2012-03-25 NOTE — Progress Notes (Signed)
Patient presents today for 3 month DM follow-up as part of the employer-sponsored [employer] to Wellness program. Medications and glucose readings have been reviewed. I have also discussed with patient lifestyle interventions such as healthy eating choices and physical activity. Details of this visit can be found in Care Tracker documenting program available through [facility]). Patient has set a series of personal goals and will follow-up in 3 months for further review of DM.   Megan D. Vivia Ewing, PharmD, BCPS

## 2012-04-19 NOTE — Progress Notes (Signed)
Patient ID: Alice Jones, female   DOB: [birth year], 52 y.o.   MRN: [id number] ATTENDING PHYSICIAN NOTE: I have reviewed the chart and agree with the plan as detailed above. Denny Levy MD Pager [phone]

## 2012-07-04 ENCOUNTER — Ambulatory Visit (INDEPENDENT_AMBULATORY_CARE_PROVIDER_SITE_OTHER): Payer: 59 | Admitting: Family Medicine

## 2012-07-04 VITALS — BP 128/80 | HR 78 | Ht 62.0 in | Wt 167.8 lb

## 2012-07-04 DIAGNOSIS — E119 Type 2 diabetes mellitus without complications: Secondary | ICD-10-CM

## 2012-07-04 NOTE — Progress Notes (Signed)
Subjective:  Patient presents today for 3 month diabetes follow-up as part of the employer-sponsored Link to Wellness program. Current diabetes regimen includes metformin and now starting Invokanna. Most recent MD follow-up was today, 07/01/12. Patient has a pending appt for October with repeat blood work for 6 weeks to recheck Synthroid dose. No major changes at this time.  Still worried about blood sugar readings going up and has gained 10 lbs in 6 months. Trying to be more restrictive on diet but struggling to restrict as much as she did before. Thyroid has worsened and per Dr. Talmage Nap, thinks this is affecting her efforts. Started on high protein, low carb diet about a week ago that includes shakes 2x/day and a high protein, low carb meal for the third meal. Reports more exercise hasn't helped. Plans to join gym to increase exercise.    Disease Assessments:  Diabetes:  Type of Diabetes: Type 2; uses glucometer; takes medications as prescribed; does not take an aspirin a day; hypoglycemia frequency never; checks blood glucose more than 4 times a day; checks feet daily; Year of diagnosis 2012; MD managing Diabetes Dr. Talmage Nap; Sees Diabetes provider 2 times per year;   Highest CBG 180; Lowest CBG 98; Other Diabetes History: Checking BG 5 times a day. Fasting, pre-meals, bedtime.   She rarely has a low blood sugar- lowest in 90s. Didn't bring meter in today. Saw Dr. Talmage Nap earlier today, started on Invokanna due to further in crease in A1c.      Social History:  Alcohol use: 1 - 2 drinks per week; Caffeine use: coffee soda May have couple sodas in mornings, switches to decaff sodas in evenings;   Medication adherence adherent; Exercise adherence 3-4 days a week; Diet adherence Greater than 75% of the time; Denies drug use; Patient knows the purpose/use of medications; Patient can afford medications; 130 minutes of exercise per week.   Nutrition-  Has been making protein shakes for 2 meals/day - little  fruit to flavor, some fiber, [employer] powder for vitmains and minerals  For the other meal - meat + nonstarchy vegetable  Estimates <60 g carbs/day - just under 60 though  Snacking on nuts and small amounts of dried fruit - always a snack before bed and couple of handfuls of nuts throughout the day  Drinks - diet sodas more than water  Physical Activity- 2-3 days/week - walking for 30 min-1hr and some weight training. Plans to join gym for more strength training.    Testing:  Blood Sugar Tests:  Hemoglobin A1c: 6.3    Historical Lab Results:  Total Cholesterol 167; LDL Cholesterol 79; Triglycerides 154; HDL Cholesterol 57  Saw Dr. Talmage Nap today, labs drawn last week.    Assessment/Plan:  Glycemic control has worsened slightly (up to 6.3%) but she is still meeting her goal of an A1C less than 7%. Patient is worried by this increase. She has also gained weight which may have also contributed to her A1C increase. She frequently checks her blood sugar. Her TSH was elevated, which could contribute to weight gain. Dr. Talmage Nap added Theodis Sato at today's visit. Pt counseled on risk of yeast infections and UTIs. Also explained possibility of weight loss with this addition.  Patient has continued consistent physical activity and is using the treadmill 2-3 times weekly in addition to ocaisional weight training. She doesn't think this has helped at all for weight loss. She doesn't believe it is helping with her blood sugar at all. Encouraged her to continue  due to decrease in insulin resistance and to aid weight loss efforts. Pt reports she plans to join a gym to get more consistent strength training.  Follow up with patient in 3 months.  Patient seen by pharmacy resident, Doris Cheadle, PharmD.    Teaching Goals:  Diabetes Mellitus:  Other Patient did not wish to set any goals for next visit.   Follow up in 3 months. Next appointment: October 3 at 3:00 pm

## 2012-10-10 NOTE — Progress Notes (Signed)
Patient ID: Alice Jones, female   DOB: [birth year], 52 y.o.   MRN: [id number] ATTENDING PHYSICIAN NOTE: I have reviewed the chart and agree with the plan as detailed above. Denny Levy MD Pager [phone]

## 2012-10-24 ENCOUNTER — Ambulatory Visit (INDEPENDENT_AMBULATORY_CARE_PROVIDER_SITE_OTHER): Payer: Self-pay | Admitting: Family Medicine

## 2012-10-24 VITALS — BP 137/71 | HR 82 | Ht 62.0 in | Wt 169.0 lb

## 2012-10-24 DIAGNOSIS — E119 Type 2 diabetes mellitus without complications: Secondary | ICD-10-CM

## 2012-10-24 NOTE — Progress Notes (Signed)
Subjective:  Patient presents today for 3 month diabetes follow-up as part of the employer-sponsored Link to Wellness program. Current diabetes regimen includes metformin 500 mg BID.   She has d/c Invokana. She was having some lower back pain and she was discontinued because she thought she might be having kidney issues. She continues with metformin BID.   She cut out zyrtec because her allergies were improving (she thought) and then she broke out in the "worse hives she has ever had". She started a steroid dose pack earlier this week. She reports that the hives have improved but she is still having some problems. She isn't sure what causes the hives. 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 She has gone back to taking benadryl 50 mg po at bedtime along with the claritin 10 mg at night. She states she is taking fexofenadine in the morning. She states she is taking 2 of the 180 mg strength in the morning. I recommended to her that she drop down to taking 1 of the 180 mg tablets.   She has started taking several supplements- alpha lipoic acid, vitamin D 3, insinase and fenugreek.  She has an appointment pending with Dr. Talmage Nap in a couple of weeks. Her last appointment with Dr. Talmage Nap was over the summer in June.  Overall she seems very frustrated that her blood sugars continue to be elevated and that she also continues to gain weight.    Disease Assessments:  Diabetes:  Type of Diabetes: Type 2; uses glucometer; takes medications as prescribed; does not take an aspirin a day; checks blood glucose more than 4 times a day; checks feet daily; Year of diagnosis 2012; MD managing Diabetes Dr. Talmage Nap; Sees Diabetes provider 2 times per year;   Highest CBG 196; Lowest CBG 96; hypoglycemia frequency denies; Other Diabetes History: Checking BG 5 times a day. Fasting, pre-meals, bedtime.   She did not bring her meter with her to the appointment. She had a  196 yesterday- she thinks this was because of the steroids.   Nutrition-  She is following the advice of the Blood Sugar Diet book. She has cut out all sugar and sugar substitutes. She is no longer drinking diet sodas (was drinking 6+ a day). She is also not drinking coffee.   After this diet she changed to another diet- the [employer]. She has cut out gluten completely, no sugar or sugar substitute, no peanuts, no soy, she is trying to cut out eggs but has found this difficult. She is eating animal based proteins.   She felt like before she went out these diets her blood sugars were always elevated. Now that she is on the diet her fasting CBGs are closer to 100. She also reports that her allergies have been improved. She states that she doesn't really feel any different and she is still tired.   She states that she tries to eat a meat plus a non starchy vegetable.   She is not eating much fruit- she is getting some in her morning shake and then she eats an apple at lunch.   24 hour recall-  B- homemade protein shake (245 kcal, 18 g CHO)  L- sandwich + apple (345 kcal, 52 g CHO)  snack- almonds (200 kcal, 7 g CHO)  Dinner- chili and apple (272 kcal, 37 g CHO)  Calorie estimates are based on the app that she tracks her food with. Yesterday's daily total was around  1000 kcal.   Physical Activity-  Some treadmill and walking. She states she is walking maybe once a week for 45 minutes.    Preventive Care:    Hemoglobin A1c: 07/01/2012    Dilated Eye Exam: 10/31/2012  Other Preventive Care Notes:  Dental exam- gets every 6 months.     Vital Signs:  10/21/2012 3:35 PM (EST) Blood Pressure 137 / 71 mm/HgBMI 30.9; Height 5 ft 2 in; Pulse Rate 82 bpm; Weight 169 lbs       Assessment/Plan: Patient is a 52 year old female with DM2. Most recent A1C in June was 6.3%. She has an appointment pending with Dr. Talmage Nap so I will defer A1C testing to her office.   Patient continues to  gain weight- today she was up nearly 2 pounds since her last appointment with me. Overall she has gained 10 pounds since May 2013. Patient has tried two new diets since her last appointment with me but she states that she hasn't felt any different. One diet she has only started in the past couple of weeks so it may be too early to tell if she will feel different.   Patient reports feeling tired and sluggish nearly all of the time. Patient admits to having a stressful job. Patient is also frustrated that she continues to gain weight and has so far been unable to lose weight. I asked her what she thought she could do to lose weight. Patient suggested that she further restrict calorie an carbohydrate intake. From patient's own estimate she is eating ~1000 kcal daily, and less than 45 g of carbohydrates with each meal. I told her that I did not think she needed to restrict her intake any further and suggested a dietitian referral. She agreed to see a dietitian and I am working to see if she can get in to Wyona Almas, RD with [facility].   I am skeptical of patient's new diet ([place] diet and blood sugar diet). Since I have been seeing this patient she has tried numerous diet strategies to control her blood sugar. She has been mostly successful in keeping her A1C at goal of less than 7%, but she has continually gained weight. She also continues to feel tired, sluggish and seems unhappy. I pointed out that maybe we needed to change her routines/habits as what she is doing now doesn't seem to make her feel any better. She does not want to take any additional medications because she has read that insulin is part of the problem and does not want to take any medications that will raise insulin levels.   Patient has been mostly unwilling to exercise in the past. I challenged her today to start regular physical activity. I explained that regular physical activity is one of the best ways to improve insulin  resistance and would also give her additional energy during the day.  Follow up with dietitian referral. I will see patient back in 3 months..   Goals for Next Visit-  1. Keep up with the [place] diet for 2 more weeks and then start re-introducing trigger foods.  2. Physical Activity- my challenge to you is to exercise for 150 minutes a week for 2 weeks and see how you feel.  3. Go to see the dietitian.  Next appointment to see me is Friday January 5th at 3 PM.

## 2012-12-01 ENCOUNTER — Ambulatory Visit: Payer: 59 | Admitting: Family Medicine

## 2012-12-08 ENCOUNTER — Encounter: Payer: Self-pay | Admitting: Family Medicine

## 2012-12-08 ENCOUNTER — Ambulatory Visit (INDEPENDENT_AMBULATORY_CARE_PROVIDER_SITE_OTHER): Payer: 59 | Admitting: Family Medicine

## 2012-12-08 VITALS — Ht 62.0 in | Wt 171.9 lb

## 2012-12-08 DIAGNOSIS — E785 Hyperlipidemia, unspecified: Secondary | ICD-10-CM

## 2012-12-08 DIAGNOSIS — E669 Obesity, unspecified: Secondary | ICD-10-CM

## 2012-12-08 DIAGNOSIS — E119 Type 2 diabetes mellitus without complications: Secondary | ICD-10-CM

## 2012-12-08 NOTE — Patient Instructions (Addendum)
-   Eat at least 3 meals and 1-2 snacks per day.  Aim for no more than 5 hours between eating.   - Know the restaurants/takeout options where you can get veg's at lunch time.    - If you start using soup, try adding frozen veg's.  - Include vegetables at both lunch and dinner.   - GOAL: Walk 30 min 3 X wk.    - Record your exercise weekly.    - If not enough time, do a 7-min high-intensity interval workout.  (Look up on the internet.)  - Email [email or web address] for Med in [employer] article on HIIT.   - Use the 5-min rule:  If after 5 min, you want to quite, you can!    - Advance planning will be key to making this work for you.     Follow-up appt:  Jan 19 at 3:30 PM.

## 2012-12-08 NOTE — Progress Notes (Signed)
Medical Nutrition Therapy:  Appt start time: 1330 end time:  1430.  Assessment:  Primary concerns today: Weight management, Blood sugar control and blood lipids management.  Alice Jones lost ~25 lb when first diagnosed w/ DM a couple yrs ago, but has regained ~10 lb in the past 6 months.  For a while she was eating only ~50 g of CHO/day, and she has started to eat more carb foods, but still not at high levels.   Usual eating pattern includes 3 meals and 1-2 snacks per day. 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  Obstacle to more exercise is lack of time.    Frequent foods include water, seltzer; breakfast shake; ffd burger (no bun) 3-4 X wk; sandw w/ GF bread, apple for lunch.  Avoided foods include milk foods, rice, pasta, all potatoes, and most gluten sources.  (Not diagnosed as gluten-sensitive, but feels digestion is better w/out it.)  [name] checks BG at fasting and sometimes later in the day; currently FBG is running 90's-110.    24-hr recall: (Up at 6:30 AM) B (8:10 AM)-   Shake (4 oz coconut milk, 4 oz kefir, fiber powder, protein powder (16 g), 1/2 banana, green tea extract) Snk ( AM)-    L (12;45)-  6" meatball sub, water Snk ( PM)-  Handful of nuts D (8:15 PM)-  2 [employer] beefy 5-layer burritos Snk ( PM)-  1 glass wine Yesterday was atypical in that she had fast food 2 X.  Dinner is usually a meat and 1-2 veg's.    Progress Towards Goal(s):  In progress.   Nutritional Diagnosis:  [place]-3.3 Overweight/obesity As related to dietary balance and inadequate exercise.  As evidenced by usual intake includes veg's only 1 X day, and no consistent exercise.    Intervention:  Nutrition education.  Monitoring/Evaluation:  Dietary intake, exercise, BG, and body weight in 8 week(s).  No sooner appt available.

## 2012-12-13 ENCOUNTER — Other Ambulatory Visit: Payer: Self-pay

## 2012-12-13 DIAGNOSIS — Z1231 Encounter for screening mammogram for malignant neoplasm of breast: Secondary | ICD-10-CM

## 2013-01-20 ENCOUNTER — Ambulatory Visit
Admission: RE | Admit: 2013-01-20 | Discharge: 2013-01-20 | Disposition: A | Discharge status: Home / Self Care | Payer: 59 | Source: Ambulatory Visit

## 2013-01-20 DIAGNOSIS — Z1231 Encounter for screening mammogram for malignant neoplasm of breast: Secondary | ICD-10-CM

## 2013-01-26 ENCOUNTER — Other Ambulatory Visit: Payer: Self-pay | Admitting: Internal Medicine

## 2013-01-26 DIAGNOSIS — R928 Other abnormal and inconclusive findings on diagnostic imaging of breast: Secondary | ICD-10-CM

## 2013-01-30 ENCOUNTER — Ambulatory Visit (INDEPENDENT_AMBULATORY_CARE_PROVIDER_SITE_OTHER): Payer: Self-pay | Admitting: Family Medicine

## 2013-01-30 VITALS — BP 132/80 | Ht 62.0 in | Wt 168.0 lb

## 2013-01-30 DIAGNOSIS — E119 Type 2 diabetes mellitus without complications: Secondary | ICD-10-CM

## 2013-01-30 NOTE — Progress Notes (Signed)
Subjective:  Patient presents today for 3 month diabetes follow-up as part of the employer-sponsored Link to Wellness program. Current diabetes regimen includes Metformin 500 mg ER in the morning and 1000 mg ER in the evening, Invokana 100 mg once daily.   Patient had a mammogram last week that showed an abnormality. She goes this next week for a follow up mammogram and possible ultrasound. Last visit with Dr. Chalmers Cater was October 17th and A1C was 5.8 %. She returns for a follow up appointment with her in April. She also had her thyroid level checked again in December but no changes were made to her medication.   She reports that she has started a new diet in the past few weeks.  She was taking several supplements to help lower her blood glucose and she has stopped taking them due to cost and she was unsure if they were really helping her at all.    Disease Assessments:  Diabetes:  Type of Diabetes: Type 2; uses glucometer; takes medications as prescribed; does not take an aspirin a day; checks feet daily; Year of diagnosis 2012; MD managing Diabetes Dr. Chalmers Cater; Sees Diabetes provider 2 times per year; checks blood glucose 3-4 times a day;   7 day CBG average 131; 14 day CBG average 129; 30 day CBG average 121; Highest CBG 187; Lowest CBG 85; hypoglycemia frequency rarely; Other Diabetes History: She states that her blood sugars have been lower. She feels like her blood sugar is really low, but it is only 80-90 when she checks.   She is checking her blood sugar 3-4 times daily.   Patient recently restarted Invokana 100 mg daily.   The majority of CBG readings are between 90-120.  A1C today was 5.5%.     Nutrition-  She was following the [place] but states that it was hard to follow all the restrictions that the diet required. She still estimates that she is eating a low calorie diet but is really focused on keeping a low carbohydrate diet. She states that during work it is challenging to get  a full lunch in and often eats a salad.   Now she is following the bulletproof diet and she is focusing on removing toxins from her diet. Toxins are considered as gluten, dairy, eggs. She states that she is trying to put more grass fed beef in her diet and healthy fats in her diet. She states that she has been trying to follow the diet but finds that it is hard to find grass fed beef when she eats out. She also states that the diet encourages butter to be added the coffee. She tried using coconut oil too because it was recommended by the diet but she couldn't handle it because it made her stomach upset. She states that the overall reach of the diet is to be in a fasting mode and use fat as a fuel.   She states that she does feel better since following the diet. She has cut out a lot of dairy but is still eating a little bit. She is overall still staying low carb. When she has a sandwich she is eating it on a small yeast roll or just 1 slice of bread. She isn't as strict as logging all of her food as she was previously.   She is meeting with Iver Nestle, RD with [facility] and she feels like the appointment was worthwhile.   Physical Activity-  Some treadmill and walking. She states  she is walking 2-3 times a week for 30 minutes. This is an increase from before when she was completing little to no physical activity each week.       Dilated Eye Exam: 10/31/2012  Other Preventive Care Notes:  Dental exam- gets every 6 months.       Vital Signs:  01/30/2013 11:44 AM (EST) Blood Pressure 132 / 80 mm/HgBMI 30.7; Height 5 ft 2 in; Weight 168 lbs    Testing:  Blood Sugar Tests:  Hemoglobin A1c: 5.5 resulted on 01/27/2013     Assessment/Plan: Patient is a 53 year old female with DM2. A1C today was 5.5% which is a drop from 5.8% from her last visit with Dr. Chalmers Cater 3 months ago. Patient's weight is unchanged since the last appointment. She states that she had lost a few pounds but gained the  weight back during the holidays. She started seeing a dietitian with the [facility] and she states that this has been helpful. She is on a new diet that seems like it is not quite as restrictive as the diet she was on previously. She admits that she feels better and feels fuller and more satisfied. Patient has also increased physical activity since her last appointment with me. CBGS have improved which matches the drop in A1C.   I feel like patient is doing well as evidenced by a lower A1C and increased physical activity. I agreed to see her in 6 months when I return from maternity leave, but follow up via email in 2 months to see how things are going.   I will fax A1C results to Dr. Almetta Lovely office.    Goals for Next Visit-  1. Continue working with your diet to figure out what foods you can eat.  2. Overall goal of weight loss.  3. Continue physical activity- keep trying to get 150 minutes.  Next appointment to see me is Friday July 24th at 3 PM.

## 2013-02-03 ENCOUNTER — Ambulatory Visit
Admission: RE | Admit: 2013-02-03 | Discharge: 2013-02-03 | Disposition: A | Discharge status: Home / Self Care | Payer: 59 | Source: Ambulatory Visit | Attending: Internal Medicine | Admitting: Internal Medicine

## 2013-02-03 ENCOUNTER — Other Ambulatory Visit: Payer: Self-pay | Admitting: Internal Medicine

## 2013-02-03 DIAGNOSIS — R928 Other abnormal and inconclusive findings on diagnostic imaging of breast: Secondary | ICD-10-CM

## 2013-02-03 DIAGNOSIS — N6310 Unspecified lump in the right breast, unspecified quadrant: Secondary | ICD-10-CM

## 2013-02-06 ENCOUNTER — Encounter: Payer: Self-pay | Admitting: Family Medicine

## 2013-02-06 ENCOUNTER — Ambulatory Visit (INDEPENDENT_AMBULATORY_CARE_PROVIDER_SITE_OTHER): Payer: 59 | Admitting: Family Medicine

## 2013-02-06 VITALS — Ht 62.0 in | Wt 169.4 lb

## 2013-02-06 DIAGNOSIS — E785 Hyperlipidemia, unspecified: Secondary | ICD-10-CM

## 2013-02-06 DIAGNOSIS — E669 Obesity, unspecified: Secondary | ICD-10-CM

## 2013-02-06 DIAGNOSIS — E119 Type 2 diabetes mellitus without complications: Secondary | ICD-10-CM

## 2013-02-06 NOTE — Progress Notes (Signed)
Medical Nutrition Therapy:  Appt start time: 1330 end time:  1430.  Assessment:  Primary concerns today: Weight management, Blood sugar control and blood lipids management.  Alice Jones has recently modified her diet some, having read the book, "Grain Brain," so has limited gluten and started taking a higher dose of DHA as well as some probiotics.  She is mostly following the Bulletproof Diet Alice Jones).  She is consuming "Bulletproof coffee" (official recipe includes 1-2 T each MCT oil & grass-fed butter) each am, but she modifies by adding ~2 tbsp grass-fed butter & ~2 tbsp grass-fed whole milk  (no MCT oil).  Consistently getting grass-fed beef has been tough, however, b/c Alice Jones does not especially like to cook, so relies on some restaurant/takeout foods.  She is using a phone app Alice Jones) to track food intake and exercise on most days.    She has been checking BG 3-4 X day.  FBG is usually 100-120.    Alice Jones has been taking an antihistamine nightly for some time now, and ended up with a serious case of hives when she tried to discontinue recently.  She wonders if this "living on the edge of inflammation" all the time can be diet- or environmentally related.  Has never been able to detect an association w/ any particular food.    24-hr recall:  (Up at 11:30 AM) B (1 PM)-   --- Snk ( AM)-   --- L (1 PM)-  Bulletproof coffee (see above), 3 fried eggs in grass-fed butter, 1 slc GF toast.  Snk (4 PM)-  1 Clementine,  Snk (6 PM)-  3 crackers (10 g CHO), 2 tbsp cream chs, smoked salmon D (7 PM)-  4 oz steak, 1 slc GF bread, 1 tbsp mayo, 1/2 avocado, 1 tbsp dressing, soda water Snk ( PM)-  None Yesterday was atypical b/c Alice Jones is usually up ~9 am on weekends, and usually has 3 meals a day.    Progress Towards Goal(s):  In progress.   Nutritional Diagnosis:  [place]-3.3 Overweight/obesity As related to dietary balance and inadequate exercise.  As evidenced by usual intake includes veg's only 1 X  day, and no consistent exercise.    Intervention:  Nutrition education.  Monitoring/Evaluation:  Dietary intake, exercise, BG, and body weight in 6 week(s).

## 2013-02-06 NOTE — Patient Instructions (Addendum)
-   Looks like you are doing great controlling your blood sugar.   - Increase vegetable intake; to include at lunch time.    - Try having more salads at lunch (or at dinner too).  - Consider experimenting with the MCT oil in your dressing.   - Continue to take the supplements you are using now.   - Physical Activity Goal:    - Walk 30 min 3 X wk, and do wt training 2 X wk.   - In those weeks you can't get it all in, plan some 1-2-minute strength or cardio challenges as often as you can during the day.     - Micro-breaks:  5 min for every hour you sit or 1 min for every 20 min you sit.   - Keep me posted on what you do to follow up with the allergies:  [email or web address].

## 2013-02-09 ENCOUNTER — Ambulatory Visit (INDEPENDENT_AMBULATORY_CARE_PROVIDER_SITE_OTHER): Payer: 59 | Admitting: Internal Medicine

## 2013-02-09 ENCOUNTER — Encounter: Payer: Self-pay | Admitting: Internal Medicine

## 2013-02-09 VITALS — BP 129/76 | HR 85 | Temp 97.9°F | Resp 16 | Ht 62.0 in | Wt 172.0 lb

## 2013-02-09 DIAGNOSIS — Z139 Encounter for screening, unspecified: Secondary | ICD-10-CM

## 2013-02-09 DIAGNOSIS — Z1151 Encounter for screening for human papillomavirus (HPV): Secondary | ICD-10-CM

## 2013-02-09 DIAGNOSIS — H8120 Vestibular neuronitis, unspecified ear: Secondary | ICD-10-CM

## 2013-02-09 DIAGNOSIS — Z124 Encounter for screening for malignant neoplasm of cervix: Secondary | ICD-10-CM

## 2013-02-09 DIAGNOSIS — M5020 Other cervical disc displacement, unspecified cervical region: Secondary | ICD-10-CM

## 2013-02-09 DIAGNOSIS — E785 Hyperlipidemia, unspecified: Secondary | ICD-10-CM

## 2013-02-09 DIAGNOSIS — Z0000 Encounter for general adult medical examination without abnormal findings: Secondary | ICD-10-CM

## 2013-02-09 LAB — CBC WITH DIFFERENTIAL/PLATELET
BASOS PCT: 0 % (ref 0–1)
Basophils Absolute: 0 10*3/uL (ref 0.0–0.1)
EOS ABS: 0.2 10*3/uL (ref 0.0–0.7)
EOS PCT: 2 % (ref 0–5)
HCT: 45.3 % (ref 36.0–46.0)
HEMOGLOBIN: 15.2 g/dL — AB (ref 12.0–15.0)
LYMPHS ABS: 3.3 10*3/uL (ref 0.7–4.0)
Lymphocytes Relative: 32 % (ref 12–46)
MCH: 31.4 pg (ref 26.0–34.0)
MCHC: 33.6 g/dL (ref 30.0–36.0)
MCV: 93.6 fL (ref 78.0–100.0)
Monocytes Absolute: 0.7 10*3/uL (ref 0.1–1.0)
Monocytes Relative: 7 % (ref 3–12)
NEUTROS PCT: 59 % (ref 43–77)
Neutro Abs: 6.1 10*3/uL (ref 1.7–7.7)
Platelets: 334 10*3/uL (ref 150–400)
RBC: 4.84 MIL/uL (ref 3.87–5.11)
RDW: 13.1 % (ref 11.5–15.5)
WBC: 10.3 10*3/uL (ref 4.0–10.5)

## 2013-02-09 LAB — LIPID PANEL
CHOL/HDL RATIO: 3.8 ratio
Cholesterol: 227 mg/dL — ABNORMAL HIGH (ref 0–200)
HDL: 59 mg/dL (ref >39)
TRIGLYCERIDES: 498 mg/dL — AB (ref <150)

## 2013-02-09 LAB — COMPREHENSIVE METABOLIC PANEL
ALK PHOS: 85 U/L (ref 39–117)
ALT: 25 U/L (ref 0–35)
AST: 19 U/L (ref 0–37)
Albumin: 4.3 g/dL (ref 3.5–5.2)
BILIRUBIN TOTAL: 0.2 mg/dL — AB (ref 0.3–1.2)
BUN: 18 mg/dL (ref 6–23)
CO2: 25 meq/L (ref 19–32)
CREATININE: 0.84 mg/dL (ref 0.50–1.10)
Calcium: 9.6 mg/dL (ref 8.4–10.5)
Chloride: 104 mEq/L (ref 96–112)
GLUCOSE: 135 mg/dL — AB (ref 70–99)
Potassium: 4.4 mEq/L (ref 3.5–5.3)
SODIUM: 137 meq/L (ref 135–145)
TOTAL PROTEIN: 6.8 g/dL (ref 6.0–8.3)

## 2013-02-09 LAB — POCT URINALYSIS DIPSTICK
BILIRUBIN UA: NEGATIVE
GLUCOSE UA: 2
KETONES UA: NEGATIVE
Leukocytes, UA: NEGATIVE
Nitrite, UA: NEGATIVE
Protein, UA: NEGATIVE
RBC UA: NEGATIVE
Spec Grav, UA: 1.01
Urobilinogen, UA: 0.2
pH, UA: 6

## 2013-02-09 LAB — TSH: TSH: 1.442 u[IU]/mL (ref 0.350–4.500)

## 2013-02-09 NOTE — Progress Notes (Signed)
Subjective:    Patient ID: Alice Jones, female    DOB: Mar 22, 1960, 53 y.o.   MRN: [id number]  HPI  Alice Jones is here for CPE.    Diabets  She receives her diabetes care with Dr. Chalmers Jones.   HM  She repeatedly declines all vaccines despite my counsel that they would be of preventive benefit for her.  She also declines colonoscopy despite my counsel that she is risking a malignancy or pre-malignant lesion.    She take a high dose of vitamn D now per Dr. Chalmers Jones  See recent mm.  She has a biopsy schedule for later this month.   She is seeing Dr. Jenne Jones for nutritional counsel.  She is watching diet and tells me her last AIC was 5.4%  Allergies  Allergen Reactions  . Liraglutide     nausea   Past Medical History  Diagnosis Date  . Herniated cervical disc   . Allergic rhinitis   . Diabetes mellitus     Dr. Chalmers Jones  . Hyperlipemia   . Thyroid disease   . Hypothyroid   . Carpal tunnel syndrome    Past Surgical History  Procedure Laterality Date  . Shoulder surgery      Right  . Appendectomy  09/2009  . Cervical fusion  12/09, 12/10    C6/7, C5/6   History   Social History  . Marital Status: Single    Spouse Name: N/A    Number of Children: 1  . Years of Education: law degree   Occupational History  . lawyer    Social History Main Topics  . Smoking status: Never Smoker   . Smokeless tobacco: Not on file  . Alcohol Use: No  . Drug Use: No  . Sexual Activity: Yes    Partners: Female   Other Topics Concern  . Not on file   Social History Narrative  . No narrative on file   Family History  Problem Relation Age of Onset  . Cancer Mother     tongue  . Diabetes Maternal Grandfather    Patient Active Problem List   Diagnosis Date Noted  . Obesity (BMI 30.0-34.9) 12/08/2012  . 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 Dizziness 09/01/2011  . Glaucoma, narrow-angle 11/13/2010  . Allergic rhinitis   . Diabetes mellitus   . Hyperlipemia   . Herniated cervical disc      Current Outpatient Prescriptions on File Prior to Visit  Medication Sig Dispense Refill  . Azelastine-Fluticasone (DYMISTA) 137-50 MCG/ACT SUSP Place 1 spray into the nose 2 (two) times daily.      . Canagliflozin (INVOKANA) 100 MG TABS Take 100 mg by mouth.      . cholecalciferol (VITAMIN D) 1000 UNITS tablet Take 1,000 Units by mouth daily.      . diphenhydrAMINE (SOMINEX) 25 MG tablet Take 25 mg by mouth at bedtime as needed for sleep.      . fexofenadine (ALLEGRA) 180 MG tablet Take 180 mg by mouth daily.      . fish oil-omega-3 fatty acids 1000 MG capsule Take 2 g by mouth daily.      Marland Kitchen levothyroxine (SYNTHROID, LEVOTHROID) 100 MCG tablet Take 112 mcg by mouth daily before breakfast.       . metFORMIN (GLUCOPHAGE-XR) 500 MG 24 hr tablet Take 1,000 mg by mouth 2 (two) times daily before a meal. Actually doing 500 AM and 1000 PM.      . Multiple Vitamins-Minerals (MULTIVITAMINS) CHEW Chew 1 tablet by mouth  daily.       No current facility-administered medications on file prior to visit.      Review of Systems    see HPI Objective:   Physical Exam Physical Exam  Vital signs and nursing note reviewed  Constitutional: She is oriented to person, place, and time. She appears well-developed and well-nourished. She is cooperative.  HENT:  Head: Normocephalic and atraumatic.  Right Ear: Tympanic membrane normal.  Left Ear: Tympanic membrane normal.  Nose: Nose normal.  Mouth/Throat: Oropharynx is clear and moist and mucous membranes are normal. No oropharyngeal exudate or posterior oropharyngeal erythema.  Eyes: Conjunctivae and EOM are normal. Pupils are equal, round, and reactive to light.  Neck: Neck supple. No JVD present. Carotid bruit is not present. No mass and no thyromegaly present.  Cardiovascular: Regular rhythm, normal heart sounds, intact distal pulses and normal pulses.  Exam reveals no gallop and no friction rub.   No murmur heard. Pulses:      Dorsalis pedis pulses  are 2+ on the right side, and 2+ on the left side.  Pulmonary/Chest: Breath sounds normal. She has no wheezes. She has no rhonchi. She has no rales. Careful breast exam reveals right breast exhibits no mass, no nipple discharge and no skin change. Left breast exhibits no mass, no nipple discharge and no skin change.   Seh has well healed breast reduction scar.   Abdominal: Soft. Bowel sounds are normal. She exhibits no distension and no mass. There is no hepatosplenomegaly. There is no tenderness. There is no CVA tenderness.  Genitourinary: Rectum normal, vagina normal and uterus normal. Rectal exam shows no mass. Guaiac negative stool. No labial fusion. There is no lesion on the right labia. There is no lesion on the left labia. Cervix exhibits no motion tenderness. Right adnexum displays no mass, no tenderness and no fullness. Left adnexum displays no mass, no tenderness and no fullness. No erythema around the vagina.  Musculoskeletal:       No active synovitis to any joint.    Lymphadenopathy:       Right cervical: No superficial cervical adenopathy present.      Left cervical: No superficial cervical adenopathy present.       Right axillary: No pectoral and no lateral adenopathy present.       Left axillary: No pectoral and no lateral adenopathy present.      Right: No inguinal adenopathy present.       Left: No inguinal adenopathy present.  Neurological: She is alert and oriented to person, place, and time. She has normal strength and normal reflexes. No cranial nerve deficit or sensory deficit. She displays a negative Romberg sign. Coordination and gait normal.  Skin: Skin is warm and dry. No abrasion, no bruising, no ecchymosis and no rash noted. No cyanosis. Nails show no clubbing.  Psychiatric: She has a normal mood and affect. Her speech is normal and behavior is normal.          Assessment & Plan:  Health Maintenance  Advised colonoscopy and vaccines  Pt declines at this  time  Abnormal mm  Will get biopsy later this month  Diabetes managed by Dr. Chalmers Jones  Allergic rhinitis  On Allegra or benadryl prn  Glaucoma         Assessment & Plan:

## 2013-02-10 LAB — VITAMIN D 25 HYDROXY (VIT D DEFICIENCY, FRACTURES): Vit D, 25-Hydroxy: 45 ng/mL (ref 30–89)

## 2013-02-15 ENCOUNTER — Ambulatory Visit
Admission: RE | Admit: 2013-02-15 | Discharge: 2013-02-15 | Disposition: A | Discharge status: Home / Self Care | Payer: 59 | Source: Ambulatory Visit | Attending: Internal Medicine | Admitting: Internal Medicine

## 2013-02-15 ENCOUNTER — Other Ambulatory Visit: Payer: Self-pay | Admitting: Diagnostic Radiology

## 2013-02-15 DIAGNOSIS — N6310 Unspecified lump in the right breast, unspecified quadrant: Secondary | ICD-10-CM

## 2013-02-20 ENCOUNTER — Telehealth: Payer: Self-pay | Admitting: *Deleted

## 2013-02-20 NOTE — Telephone Encounter (Signed)
Message copied by Conley Rolls on Mon Feb 20, 2013  2:26 PM ------      Message from: Emi Belfast D      Created: Sun Feb 19, 2013  6:34 PM       Jolayne Haines            Call pt and let her know that I have seen her biopsy results.  No evidence of any malignancy  Just inflammation and cysts ------

## 2013-02-20 NOTE — Telephone Encounter (Signed)
Notified pt of biopsy results

## 2013-03-23 ENCOUNTER — Encounter: Payer: Self-pay | Admitting: Family Medicine

## 2013-03-23 ENCOUNTER — Ambulatory Visit (INDEPENDENT_AMBULATORY_CARE_PROVIDER_SITE_OTHER): Payer: 59 | Admitting: Family Medicine

## 2013-03-23 VITALS — Ht 62.0 in | Wt 170.1 lb

## 2013-03-23 DIAGNOSIS — E119 Type 2 diabetes mellitus without complications: Secondary | ICD-10-CM

## 2013-03-23 DIAGNOSIS — E785 Hyperlipidemia, unspecified: Secondary | ICD-10-CM

## 2013-03-23 DIAGNOSIS — E669 Obesity, unspecified: Secondary | ICD-10-CM

## 2013-03-23 NOTE — Patient Instructions (Addendum)
-   To help make sure you get to the gym, pack your gym clothes, etc THE NIGHT BEFORE, and put this in the car with you.    - If you don't go to the gym, consider walking from home without going into the house when you first get home from work.    - PLAN your workout days each Sunday.   - Write down each time you do work out in a place you can readily see it.   - Consider lunches you could bring to work, and try have on hand meal ingredients that work for [employer].    - Make a list of potential bag lunches.    - Even if you don't bring a whole lunch, what veg's can you bring? - Make sure you eat enough early in the day.  If you feel hungry 60-90 min post-breakfast, it was not enough.   - Call if you'd like another appt, or if you have a Q (or email [email or web address]).

## 2013-03-23 NOTE — Progress Notes (Signed)
Medical Nutrition Therapy:  Appt start time: 1330 end time:  1430.  Assessment:  Primary concerns today: Weight management, Blood sugar control and blood lipids management.  Weight has remained stable or slightly higher in recent months.  Jeanine has joined a gym, but has not managed to exercise much.  Not wanting to go out in the bad weather has been one barrier.  She recently bought a spiralizer, so has been using spiralized veg's in place of pasta.  She has been eating more veg's this way.  She is still checking BG several times a day.  FBG is ranges from ~98-120.  Cannot always determine why when it is on the high side.   Naevia has seen Dr. Mingo Amber recently, who recommended (among other things) vortex water.    Yesterday's intake was atypical in that [place] usually eats more for bkfst and for lunch.  Work precluded getting a more balanced lunch, which often happens more than once a week.  We discussed bringing lunch on days she know she can't get home for lunch, but her work schedule is often unpredictable.   Elysabeth acknowledges that more physical activity is probably what she most needs.  I did not express my reservations re. bulletproof coffee mainly b/c pt seems pretty invested in the principles of the bulletproof diet.    24-hr recall:  (Up at  AM) B (8:15 AM)-  1/2 c plain yogurt (including a little vanilla yog), 2 tbsp butter in (bulletproof) coffee. Snk ( AM)-  none L (1 PM)-  [employer] 5 chx tenders Snk ( PM)-  none D (7 PM)-  1 pc spanikopita, chx, 3 oz gyro meat Snk (9 PM)-  Kefir popsicle   Progress Towards Goal(s):  In progress.   Nutritional Diagnosis:  Little progress on [place]-3.3 Overweight/obesity As related to dietary balance and inadequate exercise.  As evidenced by no consistent physical activity and BMI >30.    Intervention:  Nutrition education.  Monitoring/Evaluation:  Dietary intake, exercise, BG, and body weight prn.

## 2013-07-12 ENCOUNTER — Ambulatory Visit (INDEPENDENT_AMBULATORY_CARE_PROVIDER_SITE_OTHER): Payer: 59

## 2013-07-12 ENCOUNTER — Ambulatory Visit (INDEPENDENT_AMBULATORY_CARE_PROVIDER_SITE_OTHER): Payer: 59 | Admitting: Family Medicine

## 2013-07-12 ENCOUNTER — Encounter: Payer: Self-pay | Admitting: Family Medicine

## 2013-07-12 VITALS — BP 116/74 | HR 82 | Temp 98.4°F | Resp 16 | Ht 62.0 in | Wt 170.0 lb

## 2013-07-12 DIAGNOSIS — R0989 Other specified symptoms and signs involving the circulatory and respiratory systems: Secondary | ICD-10-CM

## 2013-07-12 DIAGNOSIS — R0609 Other forms of dyspnea: Secondary | ICD-10-CM

## 2013-07-12 DIAGNOSIS — R0683 Snoring: Secondary | ICD-10-CM

## 2013-07-12 DIAGNOSIS — K219 Gastro-esophageal reflux disease without esophagitis: Secondary | ICD-10-CM

## 2013-07-12 DIAGNOSIS — R0681 Apnea, not elsewhere classified: Secondary | ICD-10-CM

## 2013-07-12 DIAGNOSIS — R0602 Shortness of breath: Secondary | ICD-10-CM

## 2013-07-12 LAB — COMPREHENSIVE METABOLIC PANEL
ALT: 15 U/L (ref 0–35)
AST: 15 U/L (ref 0–37)
Albumin: 4 g/dL (ref 3.5–5.2)
Alkaline Phosphatase: 70 U/L (ref 39–117)
BILIRUBIN TOTAL: 0.3 mg/dL (ref 0.2–1.2)
BUN: 19 mg/dL (ref 6–23)
CHLORIDE: 105 meq/L (ref 96–112)
CO2: 23 mEq/L (ref 19–32)
CREATININE: 0.88 mg/dL (ref 0.50–1.10)
Calcium: 9.4 mg/dL (ref 8.4–10.5)
Glucose, Bld: 110 mg/dL — ABNORMAL HIGH (ref 70–99)
Potassium: 4.4 mEq/L (ref 3.5–5.3)
Sodium: 137 mEq/L (ref 135–145)
Total Protein: 6.6 g/dL (ref 6.0–8.3)

## 2013-07-12 LAB — CBC WITH DIFFERENTIAL/PLATELET
Basophils Absolute: 0 10*3/uL (ref 0.0–0.1)
Basophils Relative: 0 % (ref 0–1)
EOS PCT: 4 % (ref 0–5)
Eosinophils Absolute: 0.4 10*3/uL (ref 0.0–0.7)
HCT: 44.6 % (ref 36.0–46.0)
Hemoglobin: 15 g/dL (ref 12.0–15.0)
LYMPHS ABS: 3 10*3/uL (ref 0.7–4.0)
Lymphocytes Relative: 28 % (ref 12–46)
MCH: 30.5 pg (ref 26.0–34.0)
MCHC: 33.6 g/dL (ref 30.0–36.0)
MCV: 90.8 fL (ref 78.0–100.0)
Monocytes Absolute: 0.7 10*3/uL (ref 0.1–1.0)
Monocytes Relative: 7 % (ref 3–12)
Neutro Abs: 6.5 10*3/uL (ref 1.7–7.7)
Neutrophils Relative %: 61 % (ref 43–77)
PLATELETS: 319 10*3/uL (ref 150–400)
RBC: 4.91 MIL/uL (ref 3.87–5.11)
RDW: 13.2 % (ref 11.5–15.5)
WBC: 10.7 10*3/uL — ABNORMAL HIGH (ref 4.0–10.5)

## 2013-07-12 NOTE — Progress Notes (Signed)
Subjective:    Patient ID: Alice Jones, female    DOB: 12-08-1960, 53 y.o.   MRN: [id number]  07/12/2013  Shortness of Breath   HPI This 53 y.o. female presents for evaluation of shortness of breath.  Went to bed last night and upon lying down developed the sensation that windpipe was closing.  Having PND.  Known allergic rhinitis and takes allergy shots.  Suffered with mild SOB; had to sit up quickly in bed.  Then got scared; had to calm self down.  Tried to lay down again and felt the same way. Sat up and laid head back which felt better.  Spouse is Therapist, sports.  Spouse has witnessed apnea on previous nights.  Last neck surgery, felt the same way 2011.  More difficulty time swallowing things since cervical spine surgery.  Has discussed with NS but not too concerned; [place].  Food gets stuck with pills.  No fever/chills/sweats/malaise.  No headache; no sore throat; no ear pain.  Nasal congestion present but not worsening; no worsening rhinorrhea.  No thick mucous.  No worsening PND.  Mild coughing from PND.  No DOE; no orthopnea.  No chest pain.  No swelling of lower extremities.  Some puffiness in hands intermittently.  Has chronic hives for years; taking Benadryl qhs x 5 years.  No recent hives exacerbation.  +heartburn/indigestion; did notice some burping last night; made guacamole at 10:30pm and ate it; went to bed at 12:107mdnight.  Did have severe GERD episode a few nights ago. No asthma hx.  No PPI use.  No major stressors.  No cardiac history.  Feels fine today; energy level is good.  Once got to sleep last night, slept all night without difficulties.  Did wake up frequently from stress/anxiety about event.  Non-refreshing sleep but only sleeps 6 hours per night on average.  Frequent naps on weekends.  Falls asleep easily when watching television.  Chronic fatigue for the past several years  No previous sleep study.  Worried about sleep apnea induced death acutely.   Review of Systems    Constitutional: Negative for fever, chills, diaphoresis, activity change, appetite change and fatigue.  HENT: Positive for congestion and postnasal drip. Negative for ear pain, facial swelling, mouth sores, rhinorrhea, sinus pressure, sore throat, trouble swallowing and voice change.   Eyes: Negative for visual disturbance.  Respiratory: Positive for shortness of breath. Negative for cough, choking, chest tightness, wheezing and stridor.   Cardiovascular: Negative for chest pain, palpitations and leg swelling.  Gastrointestinal: Negative for nausea, vomiting, abdominal pain, diarrhea and constipation.  Skin: Negative for rash.  Neurological: Negative for dizziness, tremors, light-headedness and headaches.  Psychiatric/Behavioral: Negative for dysphoric mood. The patient is not nervous/anxious.     Past Medical History  Diagnosis Date  . Herniated cervical disc   . Allergic rhinitis   . Diabetes mellitus     Dr. BChalmers Cater . Hyperlipemia   . Thyroid disease   . Hypothyroid   . Carpal tunnel syndrome    Past Surgical History  Procedure Laterality Date  . Shoulder surgery      Right  . Appendectomy  09/2009  . Cervical fusion  12/09, 12/10    C6/7, C5/6    Allergies  Allergen Reactions  . Liraglutide     nausea   Current Outpatient Prescriptions  Medication Sig Dispense Refill  . Azelastine-Fluticasone (DYMISTA) 137-50 MCG/ACT SUSP Place 1 spray into the nose 2 (two) times daily.      . Canagliflozin (  INVOKANA) 100 MG TABS Take 100 mg by mouth.      . cholecalciferol (VITAMIN D) 1000 UNITS tablet Take 1,000 Units by mouth daily.      . diphenhydrAMINE (BENADRYL) 25 MG tablet Take 25 mg by mouth every 6 (six) hours as needed.      . fexofenadine (ALLEGRA) 180 MG tablet Take 180 mg by mouth daily.      . fish oil-omega-3 fatty acids 1000 MG capsule Take 2 g by mouth daily.      Marland Kitchen levothyroxine (SYNTHROID, LEVOTHROID) 100 MCG tablet Take 112 mcg by mouth daily before breakfast.        . metFORMIN (GLUCOPHAGE-XR) 500 MG 24 hr tablet Take 1,000 mg by mouth 2 (two) times daily before a meal. Actually doing 500 AM and 1000 PM.      . Multiple Vitamins-Minerals (MULTIVITAMINS) CHEW Chew 1 tablet by mouth daily.       No current facility-administered medications for this visit.   History   Social History  . Marital Status: Single    Spouse Name: N/A    Number of Children: 1  . Years of Education: law degree   Occupational History  . lawyer    Social History Main Topics  . Smoking status: Never Smoker   . Smokeless tobacco: Not on file  . Alcohol Use: No  . Drug Use: No  . Sexual Activity: Yes    Partners: Female   Other Topics Concern  . Not on file   Social History Narrative  . No narrative on file   Family History  Problem Relation Age of Onset  . Cancer Mother     tongue  . Diabetes Maternal Grandfather         Objective:    BP 116/74  Pulse 82  Temp(Src) 98.4 F (36.9 C) (Oral)  Resp 16  Ht _0  (1.575 m)  Wt 170 lb (77.111 kg)  BMI 31.09 kg/m2  SpO2 96%  LMP 06/26/2013 Physical Exam  Constitutional: She is oriented to person, place, and time. She appears well-developed and well-nourished. No distress.  HENT:  Head: Normocephalic and atraumatic.  Right Ear: External ear normal.  Left Ear: External ear normal.  Nose: Nose normal.  Mouth/Throat: Oropharynx is clear and moist.  Eyes: Conjunctivae and EOM are normal. Pupils are equal, round, and reactive to light.  Neck: Trachea normal, normal range of motion and full passive range of motion without pain. Neck supple. No JVD present. No tracheal tenderness present. Carotid bruit is not present. No rigidity. No tracheal deviation, no edema, no erythema and normal range of motion present. No thyromegaly present.  Cardiovascular: Normal rate, regular rhythm, normal heart sounds and intact distal pulses.  Exam reveals no gallop and no friction rub.   No murmur heard. Pulmonary/Chest: Effort  normal and breath sounds normal. No stridor. She has no wheezes. She has no rales.  Abdominal: Soft. Bowel sounds are normal. She exhibits no distension and no mass. There is no tenderness. There is no rebound and no guarding.  Lymphadenopathy:    She has no cervical adenopathy.  Neurological: She is alert and oriented to person, place, and time. No cranial nerve deficit.  Skin: Skin is warm and dry. No rash noted. She is not diaphoretic. No erythema. No pallor.  Psychiatric: She has a normal mood and affect. Her behavior is normal.   Results for orders placed in visit on 07/12/13  CBC WITH DIFFERENTIAL  Result Value Ref Range   WBC 10.7 (*) 4.0 - 10.5 K/uL   RBC 4.91  3.87 - 5.11 MIL/uL   Hemoglobin 15.0  12.0 - 15.0 g/dL   HCT 44.6  36.0 - 46.0 %   MCV 90.8  78.0 - 100.0 fL   MCH 30.5  26.0 - 34.0 pg   MCHC 33.6  30.0 - 36.0 g/dL   RDW 13.2  11.5 - 15.5 %   Platelets 319  150 - 400 K/uL   Neutrophils Relative % 61  43 - 77 %   Neutro Abs 6.5  1.7 - 7.7 K/uL   Lymphocytes Relative 28  12 - 46 %   Lymphs Abs 3.0  0.7 - 4.0 K/uL   Monocytes Relative 7  3 - 12 %   Monocytes Absolute 0.7  0.1 - 1.0 K/uL   Eosinophils Relative 4  0 - 5 %   Eosinophils Absolute 0.4  0.0 - 0.7 K/uL   Basophils Relative 0  0 - 1 %   Basophils Absolute 0.0  0.0 - 0.1 K/uL   Smear Review Criteria for review not met    COMPREHENSIVE METABOLIC PANEL      Result Value Ref Range   Sodium 137  135 - 145 mEq/L   Potassium 4.4  3.5 - 5.3 mEq/L   Chloride 105  96 - 112 mEq/L   CO2 23  19 - 32 mEq/L   Glucose, Bld 110 (*) 70 - 99 mg/dL   BUN 19  6 - 23 mg/dL   Creat 0.88  0.50 - 1.10 mg/dL   Total Bilirubin 0.3  0.2 - 1.2 mg/dL   Alkaline Phosphatase 70  39 - 117 U/L   AST 15  0 - 37 U/L   ALT 15  0 - 35 U/L   Total Protein 6.6  6.0 - 8.3 g/dL   Albumin 4.0  3.5 - 5.2 g/dL   Calcium 9.4  8.4 - 10.5 mg/dL   [facility] reading (PRIMARY) by  Dr. Tamala Julian.  CXR: NAD  EKG: NSR; no ST changes; no  arrhythmia.      Assessment & Plan:  SOB (shortness of breath) - Plan: DG Chest 2 View, EKG 12-Lead, Ambulatory referral to Sleep Studies  Gastroesophageal reflux disease without esophagitis - Plan: CBC with Differential, Comprehensive metabolic panel, DG Chest 2 View  Snoring - Plan: DG Chest 2 View, EKG 12-Lead, Ambulatory referral to Sleep Studies  Apnea - Plan: Ambulatory referral to Sleep Studies  1. SOB/orthopnea:  New.  Acute episode last night.  Only associated symptoms include dyspepsia and chronic PND.  Most consistent with GERD induced upper airway irritation; benign exam in office; asymptomatic currently.  Recommend avoiding eating 3 hours prior to bedtime; patient declined rx for PPI or H2 blocker.  Normal EKG and CXR in office.  Normal CBC and CMET.  Cannot entirely rule out apnea related event upon going to sleep yet a bit atypical presentation.  To ED for acute worsening. 2.  GERD/dyspepsia:  New.  Recommend dietary modification and lifestyle modification.  Pt declined rx for medication. 3.  Allergic Rhinitis:  Moderately controlled with baseline daily congestion and PND without recent worsening; doubt etiology to symptoms. 4.  Snoring with witnessed apnea:  New.  Associated with DMII, obesity. Refer for sleep study and sleep consultation.  Meds ordered this encounter  Medications  . diphenhydrAMINE (BENADRYL) 25 MG tablet    Sig: Take 25 mg by mouth every 6 (six) hours  as needed.    No Follow-up on file.    Reginia Forts, M.D.  [facility] [address] [place], [place]  [address] [phone] phone [phone] fax

## 2013-07-17 ENCOUNTER — Ambulatory Visit (INDEPENDENT_AMBULATORY_CARE_PROVIDER_SITE_OTHER): Payer: 59 | Admitting: Neurology

## 2013-07-17 ENCOUNTER — Encounter: Payer: Self-pay | Admitting: Neurology

## 2013-07-17 ENCOUNTER — Ambulatory Visit: Payer: Self-pay | Admitting: Family Medicine

## 2013-07-17 VITALS — BP 124/79 | HR 70 | Resp 16 | Ht 62.0 in | Wt 167.0 lb

## 2013-07-17 DIAGNOSIS — R6889 Other general symptoms and signs: Secondary | ICD-10-CM

## 2013-07-17 DIAGNOSIS — R0609 Other forms of dyspnea: Secondary | ICD-10-CM

## 2013-07-17 DIAGNOSIS — R0683 Snoring: Secondary | ICD-10-CM | POA: Insufficient documentation

## 2013-07-17 DIAGNOSIS — R0989 Other specified symptoms and signs involving the circulatory and respiratory systems: Secondary | ICD-10-CM

## 2013-07-17 DIAGNOSIS — R0689 Other abnormalities of breathing: Secondary | ICD-10-CM | POA: Insufficient documentation

## 2013-07-17 NOTE — Patient Instructions (Signed)
Hypersomnia Hypersomnia usually brings recurrent episodes of excessive daytime sleepiness or prolonged nighttime sleep. It is different than feeling tired due to lack of or interrupted sleep at night. People with hypersomnia are compelled to nap repeatedly during the day. This is often at inappropriate times such as:  At work.  During a meal.  In conversation. These daytime naps usually provide no relief. This disorder typically affects adolescents and young adults. CAUSES  This condition may be caused by:  Another sleep disorder (such as narcolepsy or sleep apnea).  Dysfunction of the autonomic nervous system.  Drug or alcohol abuse.  A physical problem, such as:  A tumor.  Head trauma. This is damage caused by an accident.  Injury to the central nervous system.  Certain medications, or medicine withdrawal.  Medical conditions may contribute to the disorder, including:  Multiple sclerosis.  Depression.  Encephalitis.  Epilepsy.  Obesity.  Some people appear to have a genetic predisposition to this disorder. In others, there is no known cause. SYMPTOMS   Patients often have difficulty waking from a long sleep. They may feel dazed or confused.  Other symptoms may include:  Anxiety.  Increased irritation (inflammation).  Decreased energy.  Restlessness.  Slow thinking.  Slow speech.  Loss of appetite.  Hallucinations.  Memory difficulty.  Tremors, Tics.  Some patients lose the ability to function in family, social, occupational, or other settings. TREATMENT  Treatment is symptomatic in nature. Stimulants and other drugs may be used to treat this disorder. Changes in behavior may help. For example, avoid night work and social activities that delay bed time. Changes in diet may offer some relief. Patients should avoid alcohol and caffeine. PROGNOSIS  The likely outcome (prognosis) for persons with hypersomnia depends on the cause of the disorder.  The disorder itself is not life threatening. But it can have serious consequences. For example, automobile accidents can be caused by falling asleep while driving. The attacks usually continue indefinitely. Document Released: 12/26/2001 Document Revised: 03/30/2011 Document Reviewed: 11/30/2007 Exit[employer] Patient Information 2015 [employer]. This information is not intended to replace advice given to you by your health care provider. Make sure you discuss any questions you have with your health care provider.  

## 2013-07-17 NOTE — Progress Notes (Signed)
[facility] SLEEP MEDICINE CONSULT  Provider:  Larey Seat, M D  Referring Provider: Lanice Shirts, * Primary Care Physician:  Kelton Pillar, MD  Chief Complaint  Patient presents with  . New Evaluation    Room 10  . Sleep consult    HPI:  Alice Jones is a 53 y.o. female , who  Is seen here as a referral from Dr. Reginia Forts, MD,  Alice Jones is a married, caucasian female, whose chief compliant is waking out of sleep with a sensation of choking . She has a history of diabetes mellitus, diagnosed at age 24 and DD disease in the neck, status post 3 fusions. The uses an misting agent , and year around allergy shots, allegra every day to teat the postnasal drip. This may contribute to the choking sensation. After the second neck surgery she felt repeatedly as if choking.  Her Wife has reported her to snore and stop breathing . The patient works as an Forensic psychologist.   The patient has no shift work history, and stays up on week ends a little longer - 12 midnight to 1 AM and she loves to sleep in until 12 noon. Her son ( 28)  is still in school , so she can't sleep in during the school year.  Usual sleep time is from midnight to 7 AM , about 6- hours of total sleep. Nocturia times 1 or none.  She needs  an alarm, feels not refreshed in the morning. No dream enactment, normal " dreaming" , not vivid.   She wore retainers in childhood, has a crowded lower jaw.   No family history of a sleep disorder.     Review of Systems: Out of a complete 14 system review, the patient complains of only the following symptoms, and all other reviewed systems are negative. *snoring, carpal tunnel , choking. Hypersomnia, 15 points on Epworth, FSS score a was endorsed at 30. No morning headaches. No  Nocturia.   History   Social History  . Marital Status: Married    Spouse Name: Alice Jones    Number of Children: 1  . Years of Education: law degree   Occupational History   . lawyer    Social History Main Topics  . Smoking status: Never Smoker   . Smokeless tobacco: Never Used  . Alcohol Use: Yes     Comment: occas.  . Drug Use: No  . Sexual Activity: Yes    Partners: Female   Other Topics Concern  . Not on file   Social History Narrative   Patient is married (Alice Jones) and lives at home with her wife.   Patient has one child.   Patient works full-time.   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   Patient is right-handed.   Patient drinks 5-6 cups of tea before 5 pm.     Family History  Problem Relation Age of Onset  . Cancer Mother     tongue  . Diabetes Maternal Grandfather     Past Medical History  Diagnosis Date  . Herniated cervical disc   . Allergic rhinitis   . Diabetes mellitus     Dr. Chalmers Cater  . Hyperlipemia   . Thyroid disease   . Hypothyroid   . Carpal tunnel syndrome     Past Surgical History  Procedure Laterality Date  . Shoulder surgery      Right  . Appendectomy  09/2009  . Cervical fusion  12/09, 12/10  C6/7, C5/6    Current Outpatient Prescriptions  Medication Sig Dispense Refill  . Azelastine-Fluticasone (DYMISTA) 137-50 MCG/ACT SUSP Place 1 spray into the nose 2 (two) times daily.      . Canagliflozin (INVOKANA) 100 MG TABS Take 100 mg by mouth.      . cholecalciferol (VITAMIN D) 1000 UNITS tablet Take 1,000 Units by mouth daily.      . diphenhydrAMINE (BENADRYL) 25 MG tablet Take 25 mg by mouth every 6 (six) hours as needed.      . fexofenadine (ALLEGRA) 180 MG tablet Take 180 mg by mouth daily.      . fish oil-omega-3 fatty acids 1000 MG capsule Take 2 g by mouth daily.      Marland Kitchen levothyroxine (SYNTHROID, LEVOTHROID) 125 MCG tablet Take 125 mcg by mouth daily before breakfast.      . metFORMIN (GLUCOPHAGE-XR) 500 MG 24 hr tablet Take 1,000 mg by mouth 2 (two) times daily before a meal. Actually doing 500 AM and 1000 PM.      . Multiple Vitamins-Minerals (MULTIVITAMINS) CHEW Chew 1 tablet by mouth  daily.       No current facility-administered medications for this visit.    Allergies as of 07/17/2013 - Review Complete 07/17/2013  Allergen Reaction Noted  . Liraglutide  11/13/2010    Vitals: BP 124/79  Pulse 70  Resp 16  Ht 5\' 2"  (1.575 m)  Wt 167 lb (75.751 kg)  BMI 30.54 kg/m2  LMP 06/26/2013 Last Weight:  Wt Readings from Last 1 Encounters:  07/17/13 167 lb (75.751 kg)   Last Height:   Ht Readings from Last 1 Encounters:  07/17/13 5\' 2"  (1.575 m)     Physical exam:  General: The patient is awake, alert and appears not in acute distress. The patient is well groomed. Head: Normocephalic, atraumatic. Neck is supple. Mallampati 3, neck circumference: 15 inches.  Cardiovascular:  Regular rate and rhythm , without  murmurs or carotid bruit, and without distended neck veins. Respiratory: Lungs are clear to auscultation. Skin:  Without evidence of edema, or rash Trunk: BMI is  elevated and patient  has normal posture.  Neurologic exam : The patient is awake and alert, oriented to place and time.  Memory subjective  described as intact.  There is a normal attention span & concentration ability. Speech is fluent without dysarthria, dysphonia or aphasia.  Mood and affect are appropriate.  Cranial nerves: Pupils are equal and briskly reactive to light. Funduscopic exam without evidence of pallor or edema.  Extraocular movements  in vertical and horizontal planes intact and without nystagmus. Visual fields by finger perimetry are intact. Hearing to finger rub intact.  Facial sensation intact to fine touch. Facial motor strength is symmetric and tongue and uvula move midline.  Motor exam:   Normal tone ,muscle bulk and symmetric normal strength in all extremities.  Sensory:  Fine touch, pinprick and vibration were tested in all extremities. Proprioception is  normal.  Coordination: Rapid alternating movements in the fingers/hands is tested and normal. Finger-to-nose  maneuver tested and normal without evidence of ataxia, dysmetria or tremor.  Gait and station: Patient walks without assistive device . Deep tendon reflexes: in the  upper and lower extremities are symmetric and intact. Babinski maneuver  downgoing.   Assessment:  After physical and neurologic examination, review of laboratory studies, imaging, neurophysiology testing and pre-existing records, assessment is 1) moderate risk for sleep apnea , elevated Mallampati and BMI, snoring .  The sleepiness score :  EPWORTH at 15 points is highly elevated .  Plan:  Treatment plan and additional workup : Sleep test discussed, this should be done with an attended sleep study to review AHI and sleep choking, and its relation to N3 or  REM sleep.

## 2013-07-25 ENCOUNTER — Institutional Professional Consult (permissible substitution): Payer: 59 | Admitting: Neurology

## 2013-08-11 ENCOUNTER — Ambulatory Visit (INDEPENDENT_AMBULATORY_CARE_PROVIDER_SITE_OTHER): Payer: Self-pay | Admitting: Family Medicine

## 2013-08-11 VITALS — BP 118/72 | HR 77 | Ht 62.0 in | Wt 168.6 lb

## 2013-08-11 DIAGNOSIS — E119 Type 2 diabetes mellitus without complications: Secondary | ICD-10-CM

## 2013-08-11 NOTE — Progress Notes (Signed)
Subjective:  Patient presents today for 3 month diabetes follow-up as part of the employer-sponsored [employer] to Wellness program. Current diabetes regimen includes Invokana 100 mg daily and metformin ER 500 mg in the morning and 1000 mg in the evening. No medication changes at this time.  Patient states that she has an upcoming sleep study in a couple of weeks. She has had trouble with feeling like she was choking/couldn't breathe right when she was trying to go to sleep. Her partner is a respiratory therapist and has noticed that she has apneic episodes during the night. The sleep study is set up for the first week or August. She was taking Dymista at night but stopped because she felt like that was adding to the problem of feeling like she was choking before she went to bed. She states that this week she has struggled with insomnia and anxiety this week and two nights she hasn't been able to breathe. She also reports that she is tired. She had an inital visit with Dr. Roxy Horseman and she found that her tonsils were enlarged.  She had a breast biopsy this spring that showed no signs of malignancy.  Last appointment with Dr. Chalmers Cater was April 24th. A1C at that visit was 6.2%. She states that she just had bloodwork drawn today with Dr. Almetta Lovely office for A1C check. Next visit with Chalmers Cater is scheduled for October 30th.   Disease Assessments:  Diabetes: Type of Diabetes: Type 2; uses glucometer; takes medications as prescribed; does not take an aspirin a day; checks feet daily; Year of diagnosis 2012; MD managing Diabetes Dr. Chalmers Cater; Sees Diabetes provider 2 times per year; checks blood glucose 3-4 times a day;   7 day CBG average 122; 14 day CBG average 120; 30 day CBG average 120; Highest CBG 180; Lowest CBG 92; hypoglycemia frequency rarely;   Other Diabetes History:  She states that she is still checking her CBG 3-4 times daily. Always in the morning and evening and then again sometimes in the middle of the  day. She did not bring her meter with her today. She has her readings with her on her phone.          Nutrition- She states that she is following a low carb breakfast. For Lunch and dinner she is not as restrictive with carbohydrates. She is eating sandwiches for lunch with multigrain bread.  She states that by the end of the day her blood sugar is 110-120. She states that it is rarely below 100.  Physical Activity- She states that over the course of the week she is on the treadmill 2-3 hours. She usually gets on 2-3 days a week. Sometimes more. She is also golfing.      Preventive Care:    Hemoglobin A1c: 05/12/2013 Via Dr. Almetta Lovely office 6.2    Dilated Eye Exam: 10/31/2012  Other Preventive Care Notes:  Dental exam- June 2015      Vital Signs:  08/11/2013 3:30 PM (EST)Blood Pressure 118 / 72 mm/HgBMI 30.8; Height 5 ft 2 in; Pulse Rate 77 bpm; Weight 168.6 lbs   Testing:  Blood Sugar Tests: Hemoglobin A1c: 6.2 Via Dr. Almetta Lovely office resulted on 05/12/2013                 Care Plan:  08/11/2013 4:02 PM (EST) (1)  Additional Comments:  Patient is a 53 year old female with DM2. Most recent A1C in April was 6.2%, which is an increase from last visit when it  was 5.5%. Patients' weight is stable at 168.6 pounds. Patient expressed frustration that CBGs were elevated in the evening (they had been running less than 100, but lately they are running 110-120s). She is also frustrated that she has not been able to lose weight and is about 10 pounds heavier than she was a few years ago.  I reviewed patient's typical eating habits during the day and I do not believe that she is overeating. She is also eating less than 45 grams of carbohydrates with each meal. She has been on several different diets throughout the past several years, but has not been able to stick with any of them. Patient has previously seen a dietitian. She states that the RD thought that what she was eating was  appropriate and didn't offer any major changes or suggestions.  Patient has been exercising more consistently lately. She is walking on the treadmill for 45-60 minutes a few times a week. I encouraged patient to continue with this. I explained to her that it is beneficial for her to exercise even if she doesn't see dramatic changes to her weight or to her blood sugar numbers.  Follow up with patient in 5 months. .      Goals for Next Visit- 1. Continue making healthy eating choices. 2. Continue physical activity. Next appointment to see me is Friday December 4th at 3 PM.

## 2013-08-25 ENCOUNTER — Ambulatory Visit (INDEPENDENT_AMBULATORY_CARE_PROVIDER_SITE_OTHER): Payer: 59 | Admitting: Neurology

## 2013-08-25 ENCOUNTER — Encounter: Payer: Self-pay | Admitting: Neurology

## 2013-08-25 VITALS — BP 121/66 | HR 67 | Ht 62.0 in | Wt 167.0 lb

## 2013-08-25 DIAGNOSIS — G4710 Hypersomnia, unspecified: Secondary | ICD-10-CM

## 2013-08-25 DIAGNOSIS — R5383 Other fatigue: Secondary | ICD-10-CM

## 2013-08-25 DIAGNOSIS — R5381 Other malaise: Secondary | ICD-10-CM

## 2013-08-25 DIAGNOSIS — R0689 Other abnormalities of breathing: Secondary | ICD-10-CM

## 2013-09-19 HISTORY — PX: REFRACTIVE SURGERY: SHX103

## 2013-09-22 ENCOUNTER — Encounter: Payer: Self-pay | Admitting: Neurology

## 2013-09-22 ENCOUNTER — Ambulatory Visit (INDEPENDENT_AMBULATORY_CARE_PROVIDER_SITE_OTHER): Payer: 59 | Admitting: Neurology

## 2013-09-22 ENCOUNTER — Ambulatory Visit (INDEPENDENT_AMBULATORY_CARE_PROVIDER_SITE_OTHER): Payer: 59 | Admitting: *Deleted

## 2013-09-22 VITALS — BP 112/76 | HR 77 | Resp 15 | Ht 62.0 in | Wt 170.0 lb

## 2013-09-22 DIAGNOSIS — G4730 Sleep apnea, unspecified: Principal | ICD-10-CM

## 2013-09-22 DIAGNOSIS — R0902 Hypoxemia: Secondary | ICD-10-CM

## 2013-09-22 DIAGNOSIS — G4710 Hypersomnia, unspecified: Secondary | ICD-10-CM

## 2013-09-22 NOTE — Patient Instructions (Signed)
Relayed to call once results come in. 7-10 days.

## 2013-09-22 NOTE — Patient Instructions (Signed)
  Pulmonary testing to evaluate for low oxygen levels at night.  Fexofenadine daily. Benadryl typically.   HLA test today for narcolepsy.

## 2013-09-22 NOTE — Progress Notes (Signed)
[facility] SLEEP MEDICINE CONSULT  Provider:  Larey Seat, M D  Referring Provider: Lanice Shirts, * Primary Care Physician:  Kelton Pillar, MD  Chief Complaint  Patient presents with  . Follow-up    Room 10  . sleep study results    HPI:  Alice Jones is a 53 y.o. female , who  Is seen here as a referral from Dr. Reginia Forts, MD, Reviewing the sleep study dated 08-25-13 she presented with an Epworth sleepiness score of 17 points. She had barely audible snoring father pulled a cord audible breathing . Presents she had an AHI of only 0.9 but she had prolonged periods of oxygen desaturation for total time at or under 88% of 1 hour 39 minutes based on this I would like for her to be further evaluated by pulmonary function tests to see if oxygen saturation summary oxygen saturation is indicated.  Last consult note : CD [name] is a married, caucasian female, whose chief compliant is waking out of sleep with a sensation of choking . She has a history of diabetes mellitus, diagnosed at age 62 and DD disease in the neck, status post 3 fusions. The uses an misting agent , and year around allergy shots, allegra every day to teat the postnasal drip. This may contribute to the choking sensation. After the second neck surgery she felt repeatedly as if choking.  Her Wife has reported her to snore and stop breathing . The patient works as an Forensic psychologist.   The patient has no shift work history, and stays up on week ends a little longer - 12 midnight to 1 AM and she loves to sleep in until 12 noon. Her son ( 38)  is still in school , so she can't sleep in during the school year.  Usual sleep time is from midnight to 7 AM , about 6- hours of total sleep. Nocturia times 1 or none.  She needs  an alarm, feels not refreshed in the morning. No dream enactment, normal " dreaming" , not vivid.   She wore retainers in childhood, has a crowded lower jaw.   No family  history of a sleep disorder.     Review of Systems: Out of a complete 14 system review, the patient complains of only the following symptoms, and all other reviewed systems are negative. *snoring, carpal tunnel , choking. Hypersomnia, 15 points on Epworth, FSS score a was endorsed at 30. No morning headaches. No  Nocturia.   History   Social History  . Marital Status: Married    Spouse Name: [name]    Number of Children: 1  . Years of Education: law degree   Occupational History  . lawyer    Social History Main Topics  . Smoking status: Never Smoker   . Smokeless tobacco: Never Used  . Alcohol Use: Yes     Comment: occas.  . Drug Use: No  . Sexual Activity: Yes    Partners: Female   Other Topics Concern  . Not on file   Social History Narrative   Patient is married ([name]) and lives at home with her wife.   Patient has one child.   Patient works full-time.   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   Patient is right-handed.   Patient drinks 5-6 cups of tea before 5 pm.     Family History  Problem Relation Age of Onset  . Cancer Mother     tongue  . Diabetes  Maternal Grandfather     Past Medical History  Diagnosis Date  . Herniated cervical disc   . Allergic rhinitis   . Diabetes mellitus     Dr. Chalmers Cater  . Hyperlipemia   . Thyroid disease   . Hypothyroid   . Carpal tunnel syndrome     Past Surgical History  Procedure Laterality Date  . Shoulder surgery      Right  . Appendectomy  09/2009  . Cervical fusion  12/09, 12/10    C6/7, C5/6    Current Outpatient Prescriptions  Medication Sig Dispense Refill  . Azelastine-Fluticasone (DYMISTA) 137-50 MCG/ACT SUSP Place 1 spray into the nose daily.       . Canagliflozin (INVOKANA) 100 MG TABS Take 100 mg by mouth.      . cholecalciferol (VITAMIN D) 1000 UNITS tablet Take 1,000 Units by mouth daily.      . diphenhydrAMINE (BENADRYL) 25 MG tablet Take 25 mg by mouth at bedtime as needed.       .  fexofenadine (ALLEGRA) 180 MG tablet Take 180 mg by mouth daily.      . fish oil-omega-3 fatty acids 1000 MG capsule Take 2 g by mouth daily.      Marland Kitchen levothyroxine (SYNTHROID, LEVOTHROID) 125 MCG tablet Take 125 mcg by mouth daily before breakfast.      . metFORMIN (GLUCOPHAGE-XR) 500 MG 24 hr tablet Take 1,000 mg by mouth 2 (two) times daily before a meal. Actually doing 500 AM and 1000 PM.      . Multiple Vitamins-Minerals (MULTIVITAMINS) CHEW Chew 1 tablet by mouth daily.       No current facility-administered medications for this visit.    Allergies as of 09/22/2013 - Review Complete 09/22/2013  Allergen Reaction Noted  . Liraglutide  11/13/2010    Vitals: BP 112/76  Pulse 77  Resp 15  Ht '5\' 2"'  (1.575 m)  Wt 170 lb (77.111 kg)  BMI 31.09 kg/m2  LMP 09/12/2013 Last Weight:  Wt Readings from Last 1 Encounters:  09/22/13 170 lb (77.111 kg)   Last Height:   Ht Readings from Last 1 Encounters:  09/22/13 '5\' 2"'  (1.575 m)     Physical exam:  General: The patient is awake, alert and appears not in acute distress. The patient is well groomed. Head: Normocephalic, atraumatic. Neck is supple. Mallampati 3, neck circumference: 15 inches.  Cardiovascular:  Regular rate and rhythm , without  murmurs or carotid bruit, and without distended neck veins. Respiratory: Lungs are clear to auscultation. Skin:  Without evidence of edema, or rash Trunk: BMI is  elevated and patient  has normal posture.  Neurologic exam : The patient is awake and alert, oriented to place and time.  Memory subjective  described as intact.  There is a normal attention span & concentration ability. Speech is fluent without dysarthria, dysphonia or aphasia.  Mood and affect are appropriate.  Cranial nerves: Pupils are equal and briskly reactive to light. Funduscopic exam without evidence of pallor or edema.  Extraocular movements  in vertical and horizontal planes intact and without nystagmus. Visual fields by  finger perimetry are intact. Hearing to finger rub intact.  Facial sensation intact to fine touch. Facial motor strength is symmetric and tongue and uvula move midline.  Motor exam:   Normal tone ,muscle bulk and symmetric normal strength in all extremities.  Sensory:  Fine touch, pinprick and vibration were tested in all extremities. Proprioception is  normal.  Coordination: Rapid alternating movements in  the fingers/hands is tested and normal. Finger-to-nose maneuver tested and normal without evidence of ataxia, dysmetria or tremor.  Gait and station: Patient walks without assistive device . Deep tendon reflexes: in the  upper and lower extremities are symmetric and intact. Babinski maneuver  downgoing.   Assessment:  After physical and neurologic examination, review of laboratory studies, imaging, neurophysiology testing and pre-existing records, assessment is 1)   EPWORTH at 15 points is highly elevated . The patient has an allergic he endorsed respiratory problem for which he needs to check an anti-histamine. This may sedate her during the day however it is not a respiratory suppressant. Reviewing the sleep study dated 08-25-13 she presented with an Epworth sleepiness score of 17 points. She had barely audible snoring father pulled a cord audible breathing . Presents she had an AHI of only 0.9 but she had prolonged periods of oxygen desaturation for total time at or under 88% of 1 hour 39 minutes based on this I would like for her to be further evaluated by pulmonary function tests to see if oxygen saturation summary oxygen saturation is indicated.  needs to continue an anti-allergy regimen now that she reports that not being on Benadryl has caused her to have allergic responses, and rash. Fexofenadine should prevent drowsiness ; 180 mg once a day.  Referral for pulmonary function test , hopefully qualifying for a trial of oxygen at night. HLA test narcolepsy/  Rv in 4- 6 week with  NP.

## 2013-09-22 NOTE — Progress Notes (Signed)
Pt here for appt with Dr. Brett Fairy.   Order for HLA typing.  Pt taken to lab area.   Made comfortable.  Instructed to have lab draw for HLA typing for Narcolepsy.  Under aseptic technique 23g butterfly needle inserted to R AC with good blood return.  Yellow tube filled.  Pressure and bandage applied.  Pt NAD, tolerated well.   No c/o.  To check out.

## 2013-09-27 ENCOUNTER — Encounter: Payer: Self-pay | Admitting: Family Medicine

## 2013-09-27 NOTE — Progress Notes (Signed)
Patient ID: Alice Jones, female   DOB: 15-Aug-1960, 53 y.o.   MRN: [id number] Reviewed: Agree with the documentation and management of our [facility] pharmacologist.

## 2013-09-27 NOTE — Progress Notes (Signed)
Patient ID: Alice Jones, female   DOB: 08/15/1960, 53 y.o.   MRN: [id number] Reviewed: Agree with the documentation and management of our [facility] pharmacologist.

## 2013-09-27 NOTE — Progress Notes (Signed)
Patient ID: Alice Jones, female   DOB: September 21, 1960, 53 y.o.   MRN: [id number] Reviewed: Agree with the documentation and management of our [facility] pharmacologist.

## 2013-10-05 ENCOUNTER — Telehealth: Payer: Self-pay | Admitting: *Deleted

## 2013-10-05 NOTE — Telephone Encounter (Signed)
Patient was given her lab results and an appointment was scheduled with Dr. Brett Fairy to discuss and for treatment options.

## 2013-10-05 NOTE — Telephone Encounter (Signed)
Called patient to go over labs and could not hear her on the telephone.

## 2013-10-06 NOTE — Progress Notes (Signed)
Patient was given the results and has an appointment on October 13, 2013 to discuss results.

## 2013-10-13 ENCOUNTER — Encounter: Payer: Self-pay | Admitting: Neurology

## 2013-10-13 ENCOUNTER — Ambulatory Visit (INDEPENDENT_AMBULATORY_CARE_PROVIDER_SITE_OTHER): Payer: 59 | Admitting: Neurology

## 2013-10-13 VITALS — BP 124/77 | HR 81 | Resp 16 | Ht 62.0 in | Wt 170.0 lb

## 2013-10-13 DIAGNOSIS — G47419 Narcolepsy without cataplexy: Secondary | ICD-10-CM

## 2013-10-13 DIAGNOSIS — G47411 Narcolepsy with cataplexy: Secondary | ICD-10-CM

## 2013-10-13 DIAGNOSIS — R0902 Hypoxemia: Secondary | ICD-10-CM

## 2013-10-13 MED ORDER — ARMODAFINIL 250 MG PO TABS: ORAL_TABLET | ORAL | Status: DC

## 2013-10-13 NOTE — Progress Notes (Signed)
Patient was given the results and has an appointment on October 13, 2013 to discuss results. [facility] SLEEP MEDICINE CONSULT  Provider:  Larey Jones, M D  Referring Provider: Lanice Jones, * Primary Care Physician:  Alice Pillar, MD  Chief Complaint  Patient presents with  . Follow-up    Room 11  . Results    Narcolepsy    HPI:  Alice Jones is a 53 y.o. female , who  Is seen here as a referral from Dr. Reginia Forts, MD, Reviewing the sleep study dated 08-25-13 she presented with an Epworth sleepiness score of 17 points. She had barely audible snoring father pulled a cord audible breathing . Presents she had an AHI of only 0.9 but she had prolonged periods of oxygen desaturation for total time at or under 88% of 1 hour 39 minutes based on this I would like for her to be further evaluated by pulmonary function tests to see if oxygen saturation summary oxygen saturation is indicated.  We also performed an HLA test after the patient's last visit, and this returned positive for narcolepsy. The patient denies having any experience of multiple function loss or motor strength loss in relation to emotional upset or anger. She really has sleep attacks but she feels fatigued and excessively daytime sleepy. Her sleep study revealing nor apnea but prolonged desaturations to as only a limited explanation to her degree of sleepiness. I will order a nocturnal pulse oximetry for the patient I would like for her to see a pulmonologist to make sure that there is normal subclinical form of asthma or other mild pulmonary disease. She has continued to followup with an allergist. Would liketo give modafinil in the form of NUVIGIL a trial.  The idea here is that it will make her sleepy and is more manageable but should usually not interfere with nocturnal sleep. Many narcoleptics report poor sleep at night, fragmented sleep at night and daytime excessive sleepiness. The  dreams sometimes nightmarish in character and treatment torsion and worthless. During the patient's sleep study No early onset of REM sleep was noted.  Times narcoleptic usea sleep aid at night as well as an alertness support medication in daytime.       Last consult note : CD Alice Jones is a married, caucasian female, whose chief compliant is waking out of sleep with a sensation of choking . She has a history of diabetes mellitus, diagnosed at age 27 and DD disease in the neck, status post 3 fusions. The uses an misting agent , and year around allergy shots, allegra every day to teat the postnasal drip. This may contribute to the choking sensation. After the second neck surgery she felt repeatedly as if choking.  Her Wife has reported her to snore and stop breathing . The patient works as an Forensic psychologist.  The patient has no shift work history, and stays up on week ends a little longer - 12 midnight to 1 AM and she loves to sleep in until 12 noon. Her son ( 6)  is still in school , so she can't sleep in during the school year.  Usual sleep time is from midnight to 7 AM , about 6- hours of total sleep. Nocturia times 1 or none.  She needs an alarm, feels not refreshed in the morning. No dream enactment, normal " dreaming" , not vivid.  She wore retainers in childhood, has a crowded lower jaw. Had asthma.   No family history of a  sleep disorder.     Review of Systems: Out of a complete 14 system review, the patient complains of only the following symptoms, and all other reviewed systems are negative. *snoring, carpal tunnel , choking. Hypersomnia, 15 points on Epworth, FSS score a was endorsed at 30. No morning headaches. No  Nocturia.   History   Social History  . Marital Status: Married    Spouse Name: Alice Jones    Number of Children: 1  . Years of Education: law degree   Occupational History  . lawyer    Social History Main Topics  . Smoking status: Never Smoker   . Smokeless  tobacco: Never Used  . Alcohol Use: Yes     Comment: occas.  . Drug Use: No  . Sexual Activity: Yes    Partners: Female   Other Topics Concern  . Not on file   Social History Narrative   Patient is married (Alice Jones) and lives at home with her wife.   Patient has one child.   Patient works full-time.   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   Patient is right-handed.   Patient drinks 5-6 cups of tea before 5 pm.     Family History  Problem Relation Age of Onset  . Cancer Mother     tongue  . Diabetes Maternal Grandfather     Past Medical History  Diagnosis Date  . Herniated cervical disc   . Allergic rhinitis   . Diabetes mellitus     Alice Jones  . Hyperlipemia   . Thyroid disease   . Hypothyroid   . Carpal tunnel syndrome     Past Surgical History  Procedure Laterality Date  . Shoulder surgery      Right  . Appendectomy  09/2009  . Cervical fusion  12/09, 12/10    C6/7, C5/6  . Refractive surgery Left 09/2013    Current Outpatient Prescriptions  Medication Sig Dispense Refill  . Azelastine-Fluticasone (DYMISTA) 137-50 MCG/ACT SUSP Place 1 spray into the nose daily.       . Canagliflozin (INVOKANA) 100 MG TABS Take 100 mg by mouth.      . cholecalciferol (VITAMIN D) 1000 UNITS tablet Take 1,000 Units by mouth daily.      . diphenhydrAMINE (BENADRYL) 25 MG tablet Take 25 mg by mouth at bedtime as needed.       . fexofenadine (ALLEGRA) 180 MG tablet Take 180 mg by mouth daily.      . fish oil-omega-3 fatty acids 1000 MG capsule Take 2 g by mouth daily.      Marland Kitchen levothyroxine (SYNTHROID, LEVOTHROID) 125 MCG tablet Take 125 mcg by mouth daily before breakfast.      . metFORMIN (GLUCOPHAGE-XR) 500 MG 24 hr tablet Take 1,000 mg by mouth 2 (two) times daily before a meal. Actually doing 500 AM and 1000 PM.      . Multiple Vitamins-Minerals (MULTIVITAMINS) CHEW Chew 1 tablet by mouth daily.      . Armodafinil (NUVIGIL) 250 MG tablet Take one tab in AM without a  caffeine.  30 tablet  5   No current facility-administered medications for this visit.    Allergies as of 10/13/2013 - Review Complete 10/13/2013  Allergen Reaction Noted  . Liraglutide  11/13/2010    Vitals: BP 124/77  Pulse 81  Resp 16  Ht '5\' 2"'  (1.575 m)  Wt 170 lb (77.111 kg)  BMI 31.09 kg/m2  LMP 09/12/2013 Last Weight:  Wt Readings from Last  1 Encounters:  10/13/13 170 lb (77.111 kg)   Last Height:   Ht Readings from Last 1 Encounters:  10/13/13 '5\' 2"'  (1.575 m)     Physical exam:  General: The patient is awake, alert and appears not in acute distress. The patient is well groomed. Head: Normocephalic, atraumatic. Neck is supple. Mallampati 3, neck circumference: 15 inches.  Cardiovascular:  Regular rate and rhythm , without  murmurs or carotid bruit, and without distended neck veins. Respiratory: Lungs are clear to auscultation. Skin:  Without evidence of edema, or rash Trunk: BMI is  elevated and patient  has normal posture.  Neurologic exam : The patient is awake and alert, oriented to place and time.  Memory subjective  described as intact.  There is a normal attention span & concentration ability. Speech is fluent without dysarthria, dysphonia or aphasia.  Mood and affect are appropriate.  Cranial nerves: Pupils are equal and briskly reactive to light. Funduscopic exam without evidence of pallor or edema.  Extraocular movements  in vertical and horizontal planes intact and without nystagmus. Visual fields by finger perimetry are intact. Hearing to finger rub intact.  Facial sensation intact to fine touch. Facial motor strength is symmetric and tongue and uvula move midline.  Motor exam:   Normal tone ,muscle bulk and symmetric normal strength in all extremities.  Sensory:  Fine touch, pinprick and vibration were tested in all extremities. Proprioception is  normal.  Coordination: Rapid alternating movements in the fingers/hands is tested and normal.  Finger-to-nose maneuver tested and normal without evidence of ataxia, dysmetria or tremor.  Gait and station: Patient walks without assistive device . Deep tendon reflexes: in the  upper and lower extremities are symmetric and intact. Babinski maneuver  downgoing.   Assessment:  After physical and neurologic examination, review of laboratory studies, imaging, neurophysiology testing and pre-existing records, assessment is 1)   EPWORTH at 15 points is highly elevated . The patient has an allergic he endorsed respiratory problem for which he needs to check an anti-histamine. This may sedate her during the day however it is not a respiratory suppressant. Reviewing the sleep study dated 08-25-13 she presented with an Epworth sleepiness score of 17 points.  She had barely audible snoring , Presents at  AHI of only 0.9 but she had prolonged periods of oxygen desaturation for total time at or under 88% of 1 hour 39 minutes based on this I would like for her to be further evaluated by pulmonary function tests to see if oxygen saturation summary oxygen saturation is indicated. I ordered ONO., too . 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 We are meeting to discuss results. She will consider MODAFINIL. 3)Fexofenadine should prevent drowsiness ; 180 mg once a day. Referral for pulmonary function test , hopefully qualifying for a trial of oxygen at night. Please note, that the patient had asthma and hives in teen age .

## 2013-10-13 NOTE — Patient Instructions (Signed)
Narcolepsy Narcolepsy is a nervous system disorder that causes daytime sleepiness and sudden bouts of irresistible sleep during the day (sleep attacks). Many people with narcolepsy live with extreme daytime sleepiness for many years before being diagnosed and treated. Narcolepsy is a lifelong (chronic) disorder.  You normally go through cycles when you sleep. When your sleep becomes deeper, you have less body movement, and you start dreaming. This type of deep sleep should happen after about 90 minutes of lighter sleep. Deep sleep is called rapid eye movement (REM) sleep. When you have narcolepsy, REM is not well regulated. It can happen as soon as you fall asleep, and components of REM sleep can occur during the day when you are awake. Uncontrolled REM sleep causes symptoms of narcolepsy. CAUSES Narcolepsy may be caused by an abnormality with a chemical messenger (neurotransmitter) in your brain that controls your sleep and wake cycles. Most people with narcolepsy have low levels of the neurotransmitter hypocretin. Hypocretin is important for controlling wakefulness.  A hypocretin imbalance may be caused by abnormal genes that are passed down through families. It may also develop if the body's defense system (immune system) mistakenly attacks the brain cells that produce hypocretin (autoimmune disease). RISK FACTORS You may be at higher risk for narcolepsy if you have a family history of the disease. Other risk factors that may contribute include:  Injuries, tumors, or infections in the areas of the brain that control sleep.  Exposure to toxins.  Stress.  Hormones produced during puberty and menopause.  Poor sleep habits. SIGNS AND SYMPTOMS  Symptoms of narcolepsy can start at any age but usually begin when people are between the ages of 7 and 25 years. There are four major symptoms. Not everyone with narcolepsy will have all four.   Excessive daytime sleepiness. This is the most common symptom  and is usually the first symptom you will notice.  You may feel as if you are in a mental fog.  Daytime sleepiness may severely affect your performance at work or school.  You may fall asleep during a conversation or while eating dinner.  Sudden loss of muscle tone (cataplexy). You do not lose consciousness, but you may suddenly lose muscle control. When this occurs, your speech may become slurred, or your knees may buckle. This symptom is usually triggered by surprise, anger, or laughter.  Sleep paralysis. You may lose the ability to speak or move just as you start to fall asleep or wake up. You will be aware of the paralysis. It usually lasts for just a few seconds or minutes.  Vivid hallucinations. These may occur with sleep paralysis. The hallucinations are like having bizarre or frightening dreams while you are still awake. Other symptoms may include:   Trouble staying asleep at night (insomnia).  Restless sleep.  Feeling a strong urge to get up at night to smoke or eat. DIAGNOSIS  Your health care provider can diagnose narcolepsy based on your symptoms and the results of two diagnostic tests. You may also be asked to keep a sleep diary for several weeks. You may need the tests if you have had daytime sleepiness for at least 3 months. The two tests are:   A polysomnogram to find out how well your REM sleep is regulated at night. This test is an overnight sleep study. It measures your heart rate, breathing, movement, and brain waves.  A multiple sleep latency test (MSLT) to find out how well your REM sleep is regulated during the day. This   is a daytime sleep study. You may need to take several naps during the day. This test also measures your heart rate, breathing, movement, and brain waves. TREATMENT  There is no cure for narcolepsy, but treatment can be very effective in helping manage the condition. Treatment may include:  Lifestyle and sleeping strategies to help cope with the  condition.  Medicines. These may include:  Stimulant medicines to fight daytime sleepiness.  Antidepressant medicines to treat cataplexy.  Sodium oxybate. This is a strong sedative that you take at night. It can help daytime sleepiness and cataplexy. HOME CARE INSTRUCTIONS  Take all medicines as directed by your health care provider.  Follow these sleep practices:  Try to get about 8 hours of sleep every night.  Go to sleep and get up close to the same time every day.  Keep your bedroom dark, quiet, and comfortable.  Schedule short naps for when you feel sleepiest during the day. Tell your employer or teachers that you have narcolepsy. You may be able to adjust your schedule to include time for naps.  Try to get at least 20 minutes of exercise every day. This will help you sleep better at night and reduce daytime sleepiness.  Do not drink alcohol or caffeinated beverages within 4-5 hours of bedtime.  Do not eat a heavy meal before bedtime. Eat at about the same times every day.  Do not smoke.  Do not drive if you are sleepy or have untreated narcolepsy.  Do not swim or go out on the water without a life jacket. SEEK MEDICAL CARE IF: Your medicines are not controlling your narcolepsy symptoms. SEEK IMMEDIATE MEDICAL CARE IF:  You hurt yourself during a sleep attack or an attack of cataplexy.  You have chest pain or trouble breathing. Document Released: 12/26/2001 Document Revised: 05/22/2013 Document Reviewed: 12/28/2012 ExitCare Patient Information 2015 [employer]. This information is not intended to replace advice given to you by your health care provider. Make sure you discuss any questions you have with your health care provider.  

## 2013-11-01 ENCOUNTER — Encounter: Payer: Self-pay | Admitting: Pulmonary Disease

## 2013-11-01 ENCOUNTER — Ambulatory Visit (INDEPENDENT_AMBULATORY_CARE_PROVIDER_SITE_OTHER): Payer: 59 | Admitting: Pulmonary Disease

## 2013-11-01 VITALS — BP 132/84 | HR 89 | Temp 98.2°F | Ht 62.0 in | Wt 169.0 lb

## 2013-11-01 DIAGNOSIS — G4734 Idiopathic sleep related nonobstructive alveolar hypoventilation: Secondary | ICD-10-CM

## 2013-11-01 NOTE — Assessment & Plan Note (Signed)
She was found to have low oxygen level during recent sleep study w/o evidence for sleep apnea.  She does not have any other obvious cause for this, and her BMI is not significantly elevated.  To further assess will arrange for chest xray, PFT, and overnight oximetry on room air.  Will then determine if she would benefit from supplemental oxygen therapy at night.

## 2013-11-01 NOTE — Progress Notes (Signed)
Chief Complaint  Patient presents with  . SLEEP CONSULT    Referred by Dr Brett Fairy. Sleep study 2015 [facility]. Epworth Score: 15    History of Present Illness: Alice Jones is a 53 y.o. female for evaluation of nocturnal hypoxemia.  She is followed by Dr. Brett Fairy for daytime hypersomnolence.  She has was having trouble with her sleep.  She would fall asleep, and then wake up with a gasp.  She was feeling sleepy during the day.  These findings prompted referral to Dr. Brett Fairy, and then sleep study.  She was found to have narcolepsy.  During her recent sleep study she was found to have significant oxygen desaturation while asleep in the absence of sleep apnea.  Her sleep study from 08/25/13 showed AHI 0.9, SaO2 low 85%, spent 1 hr 39 min with SaO2 < 88%.  As a result she was advised to have further pulmonary assessment of nocturnal hypoxia.    She is not aware of any respiratory or cardiac problems otherwise.  She does have history of allergies and idiopathic hives, and is followed by Dr. Mosetta Anis for allery shots.  She reports having possible exposure to formaldehyde from her wood floors.  She denies cough, wheeze, sputum, chest pain, or palpitations.  There is no history of joint pain, leg swelling, or leg/lung clots.  She does not recall when her last chest xray was, and does not recall when her last breathing test was.  She denies history of anemia.    She never smoked.  She is from [place].  She works as an Forensic psychologist.  There is no history of pneumonia or exposure to tuberculosis.  She denies animal exposures.  Tests: PSG 08/25/13 >> AHI 0.9, SaO2 low 85%, Spent 1 hr 39 min with SaO2 < 88%  Nhyla Nappi  has a past medical history of Herniated cervical disc; Allergic rhinitis; Diabetes mellitus; Hyperlipemia; Thyroid disease; Hypothyroid; and Carpal tunnel syndrome.  Khrystal Jeanmarie  has past surgical history that includes Shoulder surgery; Appendectomy (09/2009);  Cervical fusion (12/09, 12/10); and Refractive surgery (Left, 09/2013).  Prior to Admission medications   Medication Sig Start Date End Date Taking? Authorizing Provider  Armodafinil (NUVIGIL) 250 MG tablet Take one tab in AM without a caffeine. 10/13/13  Yes Carmen Dohmeier, MD  Azelastine-Fluticasone (DYMISTA) 137-50 MCG/ACT SUSP Place 2 sprays into the nose daily.    Yes Historical Provider, MD  Canagliflozin (INVOKANA) 100 MG TABS Take 100 mg by mouth.   Yes Historical Provider, MD  cholecalciferol (VITAMIN D) 1000 UNITS tablet Take 1,000 Units by mouth daily.   Yes Historical Provider, MD  diphenhydrAMINE (BENADRYL) 25 MG tablet Take 12.5-25 mg by mouth at bedtime.    Yes Historical Provider, MD  fish oil-omega-3 fatty acids 1000 MG capsule Take 2 g by mouth daily.   Yes Historical Provider, MD  levothyroxine (SYNTHROID, LEVOTHROID) 137 MCG tablet Take 137 mcg by mouth daily before breakfast.   Yes Historical Provider, MD  metFORMIN (GLUCOPHAGE-XR) 500 MG 24 hr tablet Take 1,000 mg by mouth 2 (two) times daily before a meal. Actually doing 500 AM and 1000 PM.   Yes Historical Provider, MD  Multiple Vitamins-Minerals (MULTIVITAMINS) CHEW Chew 1 tablet by mouth daily.   Yes Historical Provider, MD    Allergies  Allergen Reactions  . Liraglutide     nausea    Her family history includes Cancer in her mother; Diabetes in her maternal grandfather.  She  reports that she has never  smoked. She has never used smokeless tobacco. She reports that she drinks alcohol. She reports that she does not use illicit drugs.  Review of Systems  Constitutional: Negative for fever and unexpected weight change.  HENT: Negative for congestion, dental problem, ear pain, nosebleeds, postnasal drip, rhinorrhea, sinus pressure, sneezing, sore throat and trouble swallowing.        Allergies > allergy injections, OTC antihistamines  Eyes: Negative for redness and itching.  Respiratory: Negative for cough, chest  tightness, shortness of breath and wheezing.   Cardiovascular: Negative for palpitations and leg swelling.  Gastrointestinal: Negative for nausea and vomiting.  Genitourinary: Negative for dysuria.  Musculoskeletal: Negative for joint swelling.  Skin: Negative for rash.  Neurological: Negative for headaches.  Hematological: Does not bruise/bleed easily.  Psychiatric/Behavioral: Negative for dysphoric mood. The patient is not nervous/anxious.    Physical Exam:  General - No distress ENT - No sinus tenderness, no oral exudate, no LAN, no thyromegaly, TM clear, pupils equal/reactive, MP 4, enlarged tongue Cardiac - s1s2 regular, no murmur, pulses symmetric Chest - No wheeze/rales/dullness, good air entry, normal respiratory excursion Back - No focal tenderness Abd - Soft, non-tender, no organomegaly, + bowel sounds Ext - No edema Neuro - Normal strength, cranial nerves intact Skin - No rashes Psych - Normal mood, and behavior   Lab Results  Component Value Date   WBC 10.7* 07/12/2013   HGB 15.0 07/12/2013   HCT 44.6 07/12/2013   MCV 90.8 07/12/2013   PLT 319 07/12/2013    Lab Results  Component Value Date   CREATININE 0.88 07/12/2013   BUN 19 07/12/2013   NA 137 07/12/2013   K 4.4 07/12/2013   CL 105 07/12/2013   CO2 23 07/12/2013    Lab Results  Component Value Date   ALT 15 07/12/2013   AST 15 07/12/2013   ALKPHOS 70 07/12/2013   BILITOT 0.3 07/12/2013    Lab Results  Component Value Date   TSH 1.442 02/09/2013    Assessment/Plan:  Chesley Mires, MD [facility] Pager:  [phone]

## 2013-11-01 NOTE — Patient Instructions (Signed)
Chest xray today Will schedule breathing test (PFT) and overnight oxygen test Follow up in 4 to 6 weeks

## 2013-11-01 NOTE — Progress Notes (Deleted)
   Subjective:    Patient ID: Alice Jones, female    DOB: 13-Sep-1960, 53 y.o.   MRN: [id number]  HPI    Review of Systems  Constitutional: Negative for fever and unexpected weight change.  HENT: Negative for congestion, dental problem, ear pain, nosebleeds, postnasal drip, rhinorrhea, sinus pressure, sneezing, sore throat and trouble swallowing.        Allergies > allergy injections, OTC antihistamines  Eyes: Negative for redness and itching.  Respiratory: Negative for cough, chest tightness, shortness of breath and wheezing.   Cardiovascular: Negative for palpitations and leg swelling.  Gastrointestinal: Negative for nausea and vomiting.  Genitourinary: Negative for dysuria.  Musculoskeletal: Negative for joint swelling.  Skin: Negative for rash.  Neurological: Negative for headaches.  Hematological: Does not bruise/bleed easily.  Psychiatric/Behavioral: Negative for dysphoric mood. The patient is not nervous/anxious.        Objective:   Physical Exam        Assessment & Plan:

## 2013-11-02 NOTE — Progress Notes (Signed)
I thank Dr Caprice Renshaw for the work up of this patient,  And reviewed  the assessment and plan as directed in the note . The patient is known to me .   Selden Noteboom, MD

## 2013-11-03 ENCOUNTER — Other Ambulatory Visit: Payer: Self-pay

## 2013-11-17 ENCOUNTER — Ambulatory Visit (INDEPENDENT_AMBULATORY_CARE_PROVIDER_SITE_OTHER): Payer: 59 | Admitting: Pulmonary Disease

## 2013-11-17 DIAGNOSIS — G4734 Idiopathic sleep related nonobstructive alveolar hypoventilation: Secondary | ICD-10-CM

## 2013-11-17 LAB — PULMONARY FUNCTION TEST
DL/VA % pred: 132 %
DL/VA: 5.84 ml/min/mmHg/L
DLCO UNC: 25.38 ml/min/mmHg
DLCO unc % pred: 125 %
FEF 25-75 PRE: 4.29 L/s
FEF 25-75 Post: 4.8 L/sec
FEF2575-%Change-Post: 11 %
FEF2575-%Pred-Post: 191 %
FEF2575-%Pred-Pre: 170 %
FEV1-%Change-Post: 6 %
FEV1-%PRED-POST: 119 %
FEV1-%Pred-Pre: 112 %
FEV1-POST: 2.96 L
FEV1-Pre: 2.79 L
FEV1FVC-%CHANGE-POST: 3 %
FEV1FVC-%Pred-Pre: 107 %
FEV6-%CHANGE-POST: 2 %
FEV6-%PRED-PRE: 106 %
FEV6-%Pred-Post: 108 %
FEV6-PRE: 3.24 L
FEV6-Post: 3.31 L
FEV6FVC-%Pred-Post: 103 %
FEV6FVC-%Pred-Pre: 103 %
FVC-%Change-Post: 2 %
FVC-%PRED-POST: 105 %
FVC-%Pred-Pre: 103 %
FVC-PRE: 3.24 L
FVC-Post: 3.31 L
POST FEV6/FVC RATIO: 100 %
PRE FEV6/FVC RATIO: 100 %
Post FEV1/FVC ratio: 89 %
Pre FEV1/FVC ratio: 86 %
RV % PRED: 80 %
RV: 1.36 L
TLC % pred: 102 %
TLC: 4.73 L

## 2013-11-17 NOTE — Progress Notes (Signed)
PFT done today. 

## 2013-11-20 ENCOUNTER — Encounter: Payer: Self-pay | Admitting: Pulmonary Disease

## 2013-11-21 ENCOUNTER — Telehealth: Payer: Self-pay | Admitting: Pulmonary Disease

## 2013-11-21 NOTE — Telephone Encounter (Signed)
ONO with RA 11/08/13 >> Test time 6 hrs 23 min.  Baseline SpO2 91.8%, low SpO2 88%.  PFT 11/17/13 >> FEV1 2.96 (119%), FEV1% 89, TLC 4.73 (102%), DLCO 125%, no BD  Will have my nurse inform pt that ONO did not show significant oxygen drop during sleep >> based on this she would not need oxygen at night.  Also her PFT was normal.  She needs to have chest xray done >> was ordered at her visit 11/01/13, but not done yet.  Will call back after review of CXR.

## 2013-11-23 NOTE — Telephone Encounter (Signed)
Pt returned call & can be reached at [phone].  Alice Jones

## 2013-11-23 NOTE — Telephone Encounter (Signed)
I spoke with patient about results and she verbalized understanding and had no questions She will have CXR done either this week or next week

## 2013-11-23 NOTE — Telephone Encounter (Signed)
LMTC x 1  

## 2013-12-13 ENCOUNTER — Encounter: Payer: Self-pay | Admitting: Nurse Practitioner

## 2013-12-13 ENCOUNTER — Ambulatory Visit (INDEPENDENT_AMBULATORY_CARE_PROVIDER_SITE_OTHER): Payer: 59 | Admitting: Nurse Practitioner

## 2013-12-13 VITALS — BP 131/76 | HR 76 | Ht 62.0 in | Wt 165.0 lb

## 2013-12-13 DIAGNOSIS — G47419 Narcolepsy without cataplexy: Secondary | ICD-10-CM

## 2013-12-13 NOTE — Patient Instructions (Signed)
Continue Nuvigil 250 mg daily in the am for excessive sleepiness and Narcolepsy.  Try to avoid caffeine.   Please schedule your Chest Xray before seeing Dr. Halford Chessman.  Follow up with Dr. Brett Fairy in 3 months, sooner as needed.   Narcolepsy Narcolepsy is a nervous system disorder that causes daytime sleepiness and sudden bouts of irresistible sleep during the day (sleep attacks). Many people with narcolepsy live with extreme daytime sleepiness for many years before being diagnosed and treated. Narcolepsy is a lifelong (chronic) disorder.  You normally go through cycles when you sleep. When your sleep becomes deeper, you have less body movement, and you start dreaming. This type of deep sleep should happen after about 90 minutes of lighter sleep. Deep sleep is called rapid eye movement (REM) sleep. When you have narcolepsy, REM is not well regulated. It can happen as soon as you fall asleep, and components of REM sleep can occur during the day when you are awake. Uncontrolled REM sleep causes symptoms of narcolepsy. CAUSES Narcolepsy may be caused by an abnormality with a chemical messenger (neurotransmitter) in your brain that controls your sleep and wake cycles. Most people with narcolepsy have low levels of the neurotransmitter hypocretin. Hypocretin is important for controlling wakefulness.  A hypocretin imbalance may be caused by abnormal genes that are passed down through families. It may also develop if the body's defense system (immune system) mistakenly attacks the brain cells that produce hypocretin (autoimmune disease). RISK FACTORS You may be at higher risk for narcolepsy if you have a family history of the disease. Other risk factors that may contribute include:  Injuries, tumors, or infections in the areas of the brain that control sleep.  Exposure to toxins.  Stress.  Hormones produced during puberty and menopause.  Poor sleep habits. SIGNS AND SYMPTOMS  Symptoms of narcolepsy can  start at any age but usually begin when people are between the ages of 49 and 63 years. There are four major symptoms. Not everyone with narcolepsy will have all four.   Excessive daytime sleepiness. This is the most common symptom and is usually the first symptom you will notice.  You may feel as if you are in a mental fog.  Daytime sleepiness may severely affect your performance at work or school.  You may fall asleep during a conversation or while eating dinner.  Sudden loss of muscle tone (cataplexy). You do not lose consciousness, but you may suddenly lose muscle control. When this occurs, your speech may become slurred, or your knees may buckle. This symptom is usually triggered by surprise, anger, or laughter.  Sleep paralysis. You may lose the ability to speak or move just as you start to fall asleep or wake up. You will be aware of the paralysis. It usually lasts for just a few seconds or minutes.  Vivid hallucinations. These may occur with sleep paralysis. The hallucinations are like having bizarre or frightening dreams while you are still awake. Other symptoms may include:   Trouble staying asleep at night (insomnia).  Restless sleep.  Feeling a strong urge to get up at night to smoke or eat. DIAGNOSIS  Your health care provider can diagnose narcolepsy based on your symptoms and the results of two diagnostic tests. You may also be asked to keep a sleep diary for several weeks. You may need the tests if you have had daytime sleepiness for at least 3 months. The two tests are:   A polysomnogram to find out how well your REM  sleep is regulated at night. This test is an overnight sleep study. It measures your heart rate, breathing, movement, and brain waves.  A multiple sleep latency test (MSLT) to find out how well your REM sleep is regulated during the day. This is a daytime sleep study. You may need to take several naps during the day. This test also measures your heart rate,  breathing, movement, and brain waves. TREATMENT  There is no cure for narcolepsy, but treatment can be very effective in helping manage the condition. Treatment may include:  Lifestyle and sleeping strategies to help cope with the condition.  Medicines. These may include:  Stimulant medicines to fight daytime sleepiness.  Antidepressant medicines to treat cataplexy.  Sodium oxybate. This is a strong sedative that you take at night. It can help daytime sleepiness and cataplexy. HOME CARE INSTRUCTIONS  Take all medicines as directed by your health care provider.  Follow these sleep practices:  Try to get about 8 hours of sleep every night.  Go to sleep and get up close to the same time every day.  Keep your bedroom dark, quiet, and comfortable.  Schedule short naps for when you feel sleepiest during the day. Tell your employer or teachers that you have narcolepsy. You may be able to adjust your schedule to include time for naps.  Try to get at least 20 minutes of exercise every day. This will help you sleep better at night and reduce daytime sleepiness.  Do not drink alcohol or caffeinated beverages within 4-5 hours of bedtime.  Do not eat a heavy meal before bedtime. Eat at about the same times every day.  Do not smoke.  Do not drive if you are sleepy or have untreated narcolepsy.  Do not swim or go out on the water without a life jacket. SEEK MEDICAL CARE IF: Your medicines are not controlling your narcolepsy symptoms. SEEK IMMEDIATE MEDICAL CARE IF:  You hurt yourself during a sleep attack or an attack of cataplexy.  You have chest pain or trouble breathing. Document Released: 12/26/2001 Document Revised: 05/22/2013 Document Reviewed: 12/28/2012 [facility] Patient Information 2015 [place], [place]. This information is not intended to replace advice given to you by your health care provider. Make sure you discuss any questions you have with your health care  provider.

## 2013-12-13 NOTE — Progress Notes (Signed)
PATIENT: Alice Jones DOB: 06/03/1960  REASON FOR VISIT: routine follow up for narcolepsy, EDS HISTORY FROM: patient  HISTORY OF PRESENT ILLNESS: Kla Bily is a 53 y.o. female , who  Is seen here as a referral from Dr. Reginia Forts, MD.  She returns for followup of narcolepsy.  She is using Nuvigil 250 mg daily in the am, had some jittery feelings at first but has calmed down. Is helping with sleepiness as far as not needing to nap. Epworth still 14 - not big improvement. FSS is higher at 37. ONO with RA on 11/08/13 >> Test time 6 hrs 23 min. Baseline SpO2 91.8%, low SpO2 88%.  PFT 11/17/13 >> FEV1 2.96 (119%), FEV1% 89, TLC 4.73 (102%), DLCO 125%, no BD  ONO did not show significant oxygen drop during sleep. Based on this she would not need oxygen at night. Also her PFT was normal.  She needs to have chest xray done , was ordered at her visit with Dr. Halford Chessman but not done yet. Has followup with Dr. Halford Chessman in December.  Prior Visit, 10/13/13: Reviewing the sleep study dated 08-25-13 she presented with an Epworth sleepiness score of 17 points. She had barely audible snoring father pulled a cord audible breathing . Presents she had an AHI of only 0.9 but she had prolonged periods of oxygen desaturation for total time at or under 88% of 1 hour 39 minutes based on this I would like for her to be further evaluated by pulmonary function tests to see if oxygen saturation summary oxygen saturation is indicated.  We also performed an HLA test after the patient's last visit, and this returned positive for narcolepsy. The patient denies having any experience of multiple function loss or motor strength loss in relation to emotional upset or anger. She really has sleep attacks but she feels fatigued and excessively daytime sleepy. Her sleep study revealing nor apnea but prolonged desaturations to as only a limited explanation to her degree of sleepiness. I will order a nocturnal pulse oximetry for  the patient I would like for her to see a pulmonologist to make sure that there is normal subclinical form of asthma or other mild pulmonary disease. She has continued to followup with an allergist. Would liketo give modafinil in the form of NUVIGIL a trial.   The idea here is that it will make her sleepy and is more manageable but should usually not interfere with nocturnal sleep. Many narcoleptics report poor sleep at night, fragmented sleep at night and daytime excessive sleepiness. The dreams sometimes nightmarish in character and treatment torsion and worthless. During the patient's sleep study No early onset of REM sleep was noted.  Times narcoleptic use a sleep aid at night as well as an alertness support medication in daytime.  Last consult note : CD Mrs. Amburn is a married, caucasian female, whose chief compliant is waking out of sleep with a sensation of choking . She has a history of diabetes mellitus, diagnosed at age 7 and DD disease in the neck, status post 3 fusions. The uses an misting agent , and year around allergy shots, allegra every day to teat the postnasal drip. This may contribute to the choking sensation. After the second neck surgery she felt repeatedly as if choking.   Her Wife has reported her to snore and stop breathing . The patient works as an Forensic psychologist.   The patient has no shift work history, and stays up on week ends a little longer -  12 midnight to 1 AM and she loves to sleep in until 12 noon. Her son (64)  is still in school , so she can't sleep in during the school year.  Usual sleep time is from midnight to 7 AM , about 6- hours of total sleep. Nocturia times 1 or none.  She needs an alarm, feels not refreshed in the morning. No dream enactment, normal " dreaming" , not vivid.  She wore retainers in childhood, has a crowded lower jaw. Had asthma.   No family history of a sleep disorder.   REVIEW OF SYSTEMS: Full 14 system review of systems performed and notable  only for: *snoring, carpal tunnel , choking. Hypersomnia, 15 points on Epworth, FSS score a was endorsed at 30. No morning headaches. No  Nocturia.    ALLERGIES: Allergies  Allergen Reactions  . Liraglutide     nausea    HOME MEDICATIONS: Outpatient Prescriptions Prior to Visit  Medication Sig Dispense Refill  . Armodafinil (NUVIGIL) 250 MG tablet Take one tab in AM without a caffeine. 30 tablet 5  . Azelastine-Fluticasone (DYMISTA) 137-50 MCG/ACT SUSP Place 2 sprays into the nose daily.     . cholecalciferol (VITAMIN D) 1000 UNITS tablet Take 1,000 Units by mouth daily.    . diphenhydrAMINE (BENADRYL) 25 MG tablet Take 12.5-25 mg by mouth at bedtime.     . fish oil-omega-3 fatty acids 1000 MG capsule Take 2 g by mouth daily.    Marland Kitchen levothyroxine (SYNTHROID, LEVOTHROID) 137 MCG tablet Take 137 mcg by mouth daily before breakfast.    . metFORMIN (GLUCOPHAGE-XR) 500 MG 24 hr tablet Take 1,000 mg by mouth 2 (two) times daily before a meal. Actually doing 500 AM and 1000 PM.    . Multiple Vitamins-Minerals (MULTIVITAMINS) CHEW Chew 1 tablet by mouth daily.    . Canagliflozin (INVOKANA) 100 MG TABS Take 100 mg by mouth.     No facility-administered medications prior to visit.     Filed Vitals:   12/13/13 1518  BP: 131/76  Pulse: 76  Height: '5\' 2"'  (1.575 m)  Weight: 165 lb (74.844 kg)   Body mass index is 30.17 kg/(m^2).  Physical exam:  General: The patient is awake, alert and appears not in acute distress. The patient is well groomed. Head: Normocephalic, atraumatic. Neck is supple. Mallampati 3, neck circumference: 15 inches.   Cardiovascular:  Regular rate and rhythm , without  murmurs or carotid bruit, and without distended neck veins. Respiratory: Lungs are clear to auscultation. Skin:  Without evidence of edema, or rash Trunk: BMI is  elevated and patient  has normal posture.  Neurologic exam : The patient is awake and alert, oriented to place and time.  Memory subjective   described as intact.   There is a normal attention span & concentration ability. Speech is fluent without dysarthria, dysphonia or aphasia.  Mood and affect are appropriate.  Cranial nerves: Pupils are equal and briskly reactive to light. Funduscopic exam without evidence of pallor or edema.  Extraocular movements  in vertical and horizontal planes intact and without nystagmus. Visual fields by finger perimetry are intact. Hearing to finger rub intact.  Facial sensation intact to fine touch. Facial motor strength is symmetric and tongue and uvula move midline.  Motor exam:   Normal tone ,muscle bulk and symmetric normal strength in all extremities.  Sensory:  Fine touch, pinprick and vibration were tested in all extremities. Proprioception is  normal.  Coordination: Rapid alternating movements in the  fingers/hands is tested and normal. Finger-to-nose maneuver tested and normal without evidence of ataxia, dysmetria or tremor.  Gait and station: Patient walks without assistive device . Deep tendon reflexes: in the  upper and lower extremities are symmetric and intact. Babinski maneuver  downgoing.  ASSESSMENT: After physical and neurologic examination, review of laboratory studies, imaging, neurophysiology testing and pre-existing records, assessment is:   1) EPWORTH at 14 points is highly elevated, even on Nuvigil.  Reviewing the sleep study dated 08-25-13 she presented with an Epworth sleepiness score of 17 points.  She had barely audible snoring, Presents at  AHI of only 0.9 but she had prolonged periods of oxygen desaturation for total time at or under 88% of 1 hour 39 minutes based on this. Evaluatioin by pulmonary function tests was normal and ONO  Summary shows oxygen is not indicated. 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 She will continue NUVIGIL. 3) Fexofenadine should prevent drowsiness; continue 180 mg once a day.  4) Follow up with Dr. Halford Chessman in December as planned. Please schedule  your Chest Xray before appointment. 5) Follow up with Dr. Brett Fairy in 3 months, sooner as needed.  Rudi Rummage LAM, MSN, FNP-BC, A/GNP-C 12/13/2013, 3:23 PM [facility] [address] [place],  [address] [phone]  Note: This document was prepared with digital dictation and possible smart phrase technology. Any transcriptional errors that result from this process are unintentional.

## 2013-12-18 NOTE — Progress Notes (Signed)
I agree with the assessment and plan as directed by NP .The patient is known to me .   Jahden Schara, MD  

## 2013-12-22 ENCOUNTER — Ambulatory Visit (INDEPENDENT_AMBULATORY_CARE_PROVIDER_SITE_OTHER): Payer: Self-pay | Admitting: Family Medicine

## 2013-12-22 VITALS — BP 130/80 | Wt 164.4 lb

## 2013-12-22 DIAGNOSIS — E119 Type 2 diabetes mellitus without complications: Secondary | ICD-10-CM

## 2013-12-22 NOTE — Assessment & Plan Note (Signed)
Subjective:  Patient presents today for 3 month diabetes follow-up as part of the employer-sponsored Link to Wellness program. Current diabetes regimen includes metformin ER 500 mg every morning and 1000 mg ER every evening.  Last appointment with Dr. Chalmers Cater was October 30th. A1C at that visit was 5.7%. This is a half point drop from her last A1C in April of 6.2%. Patient attributes this to her low carb diet. She started a new diet from a book from a doctor at [facility]. The diet is similar to the ketogenic diet. She reports that she is still following the diet now. At her appointment in October Dr. Chalmers Cater stopped Invokana. She is testing for ketones via urine strips and also with a blood ketone reader. She is eating between 30-40 grams of carbohydrates daily.  She was recently diagnosed with narcolepsy. She was started on Nuvigil. She was having dry mouth with Nuvigil and Invokana. Since stopping Invokana the dry mouth has improved. She also reports that with the Nuvigil her attentiveness has improved.  Weight is down 4 pounds since her last visit with me.    Disease Assessments:  Diabetes: Type of Diabetes: Type 2; uses glucometer; takes medications as prescribed; does not take an aspirin a day; checks feet daily; Year of diagnosis 2012; MD managing Diabetes Dr. Chalmers Cater; Sees Diabetes provider 2 times per year; checks blood glucose 3-4 times a day; hypoglycemia frequency rarely;   7 day CBG average 115; Highest CBG 129; Lowest CBG 101; 14 day CBG average 114; 30 day CBG average 116;   Other Diabetes History:  She is testing 2-4 times daily (fasting and before bed are the always checks; sometimes checking before/after lunch and dinner). She did not bring her meter with her today. She has her readings with her on her phone. Averages are about 7 points lower than last visit. Denies hypoglycemia. Fasting blood sugars are usually between 100-120.          Tobacco Assessment: Smoking Status: Never  smoker; Last Reviewed: 12/22/2013   Social History:  Alcohol use: 1 - 2 drinks per week; Medication adherence adherent; Diet adherence Greater than 75% of the time; No drug use; Patient knows the purpose/use of medications; Patient can afford medications; Denies caffeine use; Exercise adherence 1-2 days a week; 40 minutes of exercise per week.  Occupation: Forensic psychologist  Nutrition- She is following a ketogenic diet. She is eating 30-45 grams of carbohydrates daily. She has been following this diet for the past 3 months. She has increased protein consumption and fat. She is snacking on fattier items when she is hungry. Examples of snacks- slice of cheese, nuts. She reports that she hasn't been craving carbs the longer than she has been on the diet.  Physical Activity- Patient states that she has been slacking off on the treadmill. Since she has been wearing her apple watch she has been more active and taking more steps. Her average step count is around 5,000 daily.    Preventive Care:    Hemoglobin A1c: 11/10/2013 Via Dr. Almetta Lovely office 5.7    Dilated Eye Exam: 11/03/2013 Has had surgery on both eyes for glaucoma.  Other Preventive Care Notes:  Dental exam- June 2015      Overall Health Assessments:  Vision:  Dilated Eye Exam: 11/03/2013 Has had surgery on both eyes for glaucoma.   Vital Signs:  12/22/2013 4:05 PM (EST)Blood Pressure 130 / 80 mm/HgBMI 30.1; Height 5 ft 2 in; Weight 164.4 lbs   Testing:  Blood  Sugar Tests: Hemoglobin A1c: 5.7 via Dr. Almetta Lovely office resulted on 11/03/2013          Care Planning:  Learning Preference Assessment:  Learner: Patient  Readiness to Learn Barriers: None  Teaching Method: Explanation  Evaluation of Learning: Can function independently and verbalize knowledge  Readiness to Change:  How important is your health to you? 8  How confident are you in working to improve your health? 7  How ready are you to change to improve your health? 7  Total  Score: 7  Care Plan:  12/22/2013 4:05 PM (EST) (1)  Problem: Physical Inactivity  Role: Clinical Pharmacist  Long Term Goal Use the treadmill to walk at least once a week.  Date Started: 12/22/2013     Assessment/Plan:  Patient is a 53 year old female with DM2. Most recent A1C was 5.7% and is meeting goal of less than 7%. Patient continues to take metformin ER but has stopped Invokana after A1C dropped to 5.7%. Patient has lost 4 pounds since starting a new ketogenic diet and she seems very pleased with this. She has been following this diet for 3 months which is longer than she has been able to stay with several other diets in the past. She states that she feels like she is getting enough to eat and has limited carbohydrate intake to 30-45 grams daily. Patient was exercising over the summer but has stopped using the treadmill in the last few months. Patient acknowledges that this would help to improve glycemic control but states that she often is busy and does not place this as a priority. I encouraged her to start exercising again and patient agreed to aim for once a week.  Follow up with patient in 4 months. Goals for Next Visit- 1. Adjust your diet so that you can lose a few more pounds and keep blood glucose levels at goal without using Invokana. 2. Physical Activity- Aim for using the treadmill at least once a week for 40 minutes. Next appointment to see me is Friday April 22nd at 3 PM.

## 2013-12-26 ENCOUNTER — Telehealth: Payer: Self-pay | Admitting: Pulmonary Disease

## 2013-12-26 ENCOUNTER — Ambulatory Visit (INDEPENDENT_AMBULATORY_CARE_PROVIDER_SITE_OTHER)
Admission: RE | Admit: 2013-12-26 | Discharge: 2013-12-26 | Disposition: A | Discharge status: Home / Self Care | Payer: 59 | Source: Ambulatory Visit | Attending: Pulmonary Disease | Admitting: Pulmonary Disease

## 2013-12-26 DIAGNOSIS — G4734 Idiopathic sleep related nonobstructive alveolar hypoventilation: Secondary | ICD-10-CM

## 2013-12-26 NOTE — Telephone Encounter (Signed)
Dg Chest 2 View  12/26/2013   CLINICAL DATA:  53 year old with hypoxia  EXAM: CHEST 2 VIEW  COMPARISON:  07/12/2013  FINDINGS: Partially visualized cervical fixation hardware is present.  The cardiac silhouette and mediastinal contours are within normal limits. There is no focal airspace consolidation, pleural effusion or pneumothorax.  Mild degenerative changes of thoracic spine are present.  IMPRESSION: No active cardiopulmonary disease.   Electronically Signed   By: Rosemarie Ax   On: 12/26/2013 15:53    Will have my nurse inform pt that CXR is normal.

## 2013-12-29 ENCOUNTER — Encounter: Payer: Self-pay | Admitting: Pulmonary Disease

## 2013-12-29 ENCOUNTER — Ambulatory Visit (INDEPENDENT_AMBULATORY_CARE_PROVIDER_SITE_OTHER): Payer: 59 | Admitting: Pulmonary Disease

## 2013-12-29 VITALS — BP 134/70 | HR 91 | Ht 62.0 in | Wt 166.6 lb

## 2013-12-29 DIAGNOSIS — G4734 Idiopathic sleep related nonobstructive alveolar hypoventilation: Secondary | ICD-10-CM

## 2013-12-29 NOTE — Progress Notes (Signed)
Chief Complaint  Patient presents with  . Follow-up    Discuss PFT and ONO. Pt reports some choking sensation with sleep--no change.     History of Present Illness: Alice Jones is a 53 y.o. female with nocturnal hypoxemia on sleep study from August 2015.  Since her last visit she had CXR, PFT, and ONO on room air.  All normal.  She gets episodes about 1 or 2 times per week in which she feels her throat closing as she falls asleep.  Once she is asleep she does not notice problems.  When these events happen she gets very anxious.  TESTS: PSG 08/25/13 >> AHI 0.9, SaO2 low 85%, Spent 1 hr 39 min with SaO2 < 88% ONO with RA 11/08/13 >> Test time 6 hrs 23 min. Baseline SpO2 91.8%, low SpO2 88%. PFT 11/17/13 >> FEV1 2.96 (119%), FEV1% 89, TLC 4.73 (102%), DLCO 125%, no BD  Past medical hx >> DM, rhinitis, HLD, Hypothyroidism, Narcolepsy  Past surgical hx, Medications, Allergies, Family hx, Social hx all reviewed.   Physical Exam: Blood pressure 134/70, pulse 91, height 5\' 2"  (1.575 m), weight 166 lb 9.6 oz (75.569 kg), last menstrual period 12/17/2013, SpO2 96 %. Body mass index is 30.46 kg/(m^2).  General - No distress ENT - No sinus tenderness, no oral exudate, no LAN, low laying soft palate Cardiac - s1s2 regular, no murmur Chest - No wheeze/rales/dullness Back - No focal tenderness Abd - Soft, non-tender Ext - No edema Neuro - Normal strength Skin - No rashes Psych - normal mood, and behavior   Assessment/Plan:  Nocturnal hypoxemia >> not seen on recent ONO. Plan: - discussed option of repeat sleep study to determine if she does have sleep apnea.  Explained that this can vary on different nights.  Will contact Dr. Brett Fairy to determine if she will arrange for sleep study or whether she would prefer to arrange for repeat sleep study with [place] - she might benefit from use of sleep aide during repeat sleep study - no additional pulmonary testing is needed at this time  If  she is having sleep study arranged through Dr. Brett Fairy then she does not need follow up with pulmonary.  Chesley Mires, MD [facility] Pager:  [phone]

## 2013-12-29 NOTE — Telephone Encounter (Signed)
Pt being seen in office today by VS Results have been explained to patient, pt expressed understanding. Nothing further needed.

## 2013-12-29 NOTE — Patient Instructions (Signed)
Will call if we need to schedule sleep study

## 2014-01-02 ENCOUNTER — Telehealth: Payer: Self-pay | Admitting: Neurology

## 2014-01-02 DIAGNOSIS — R0902 Hypoxemia: Secondary | ICD-10-CM

## 2014-01-02 DIAGNOSIS — G4710 Hypersomnia, unspecified: Secondary | ICD-10-CM

## 2014-01-02 DIAGNOSIS — R0689 Other abnormalities of breathing: Secondary | ICD-10-CM

## 2014-01-02 NOTE — Telephone Encounter (Signed)
Pulmonary owrk up negative, repeat SPLITstudy to see if any sleep choking or desat's occur.

## 2014-01-02 NOTE — Progress Notes (Signed)
I greatly appreciate Dr. Hipolito Bayley input and detailed report on our mutual patient and  the time he dedicated.   Marland Kitchen Edelin Fryer, MD

## 2014-01-02 NOTE — Progress Notes (Signed)
I will schedule the test,   Jalie Eiland, MD

## 2014-02-01 NOTE — Progress Notes (Signed)
Patient ID: Alice Jones, female   DOB: 27-Jun-1960, 54 y.o.   MRN: [id number] Reviewed: Agree with the documentation and management of our [employer].

## 2014-03-16 ENCOUNTER — Ambulatory Visit (INDEPENDENT_AMBULATORY_CARE_PROVIDER_SITE_OTHER): Payer: 59 | Admitting: Neurology

## 2014-03-16 ENCOUNTER — Encounter: Payer: Self-pay | Admitting: Neurology

## 2014-03-16 VITALS — BP 130/81 | HR 75 | Resp 16 | Ht 62.0 in | Wt 164.0 lb

## 2014-03-16 DIAGNOSIS — J302 Other seasonal allergic rhinitis: Secondary | ICD-10-CM | POA: Diagnosis not present

## 2014-03-16 DIAGNOSIS — R0689 Other abnormalities of breathing: Secondary | ICD-10-CM | POA: Diagnosis not present

## 2014-03-16 DIAGNOSIS — G4710 Hypersomnia, unspecified: Secondary | ICD-10-CM

## 2014-03-16 DIAGNOSIS — G47419 Narcolepsy without cataplexy: Secondary | ICD-10-CM | POA: Diagnosis not present

## 2014-03-16 MED ORDER — ARMODAFINIL 250 MG PO TABS: ORAL_TABLET | ORAL | Status: DC

## 2014-03-16 NOTE — Progress Notes (Signed)
PATIENT: Alice Jones DOB: 1960-02-01  REASON FOR VISIT: routine follow up for narcolepsy, EDS HISTORY FROM: patient  HISTORY OF PRESENT ILLNESS: Alice Jones is a 54 y.o. female , who  Is seen here as a referral from Dr. Reginia Forts, MD.  She returns for followup of narcolepsy.  She is using Nuvigil 250 mg daily in the am, had some jittery feelings at first but has calmed down. Is helping with sleepiness as far as not needing to nap. Epworth still 14 - not big improvement. FSS is higher at 37. ONO with RA on 11/08/13 >> Test time 6 hrs 23 min. Baseline SpO2 91.8%, low SpO2 88%.  PFT 11/17/13 >> FEV1 2.96 (119%), FEV1% 89, TLC 4.73 (102%), DLCO 125%, no BD  ONO did not show significant oxygen drop during sleep. Based on this she would not need oxygen at night. Also her PFT was normal.  She needs to have chest xray done , was ordered at her visit with Dr. Halford Chessman but not done yet. Has followup with Dr. Halford Chessman in December.  Prior Visit, 10/13/13: Reviewing the sleep study dated 08-25-13 she presented with an Epworth sleepiness score of 17 points. She had barely audible snoring father pulled a cord audible breathing . Presents she had an AHI of only 0.9 but she had prolonged periods of oxygen desaturation for total time at or under 88% of 1 hour 39 minutes based on this I would like for her to be further evaluated by pulmonary function tests to see if oxygen saturation summary oxygen saturation is indicated.  We also performed an HLA test after the patient's last visit, and this returned positive for narcolepsy. The patient denies having any experience of multiple function loss or motor strength loss in relation to emotional upset or anger. She really has sleep attacks but she feels fatigued and excessively daytime sleepy. Her sleep study revealing nor apnea but prolonged desaturations to as only a limited explanation to her degree of sleepiness. I will order a nocturnal pulse oximetry for  the patient I would like for her to see a pulmonologist to make sure that there is normal subclinical form of asthma or other mild pulmonary disease. She has continued to followup with an allergist. Would liketo give modafinil in the form of NUVIGIL a trial.   The idea here is that it will make her sleepy and is more manageable but should usually not interfere with nocturnal sleep. Many narcoleptics report poor sleep at night, fragmented sleep at night and daytime excessive sleepiness. The dreams sometimes nightmarish in character and treatment torsion and worthless. During the patient's sleep study No early onset of REM sleep was noted.  Times narcoleptic use a sleep aid at night as well as an alertness support medication in daytime. Fazit: 1) EPWORTH at 14 points is highly elevated, even on Nuvigil.  Reviewing the sleep study dated 08-25-13 she presented with an Epworth sleepiness score of 17 points.   She had barely audible snoring, Presents at  AHI of only 0.9 but she had prolonged periods of oxygen desaturation for total time at or under 88% of 1 hour 39 minutes based on this.  Evaluatioin by pulmonary function tests was normal,  and ONO - Summary shows oxygen is not indicated. 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 She will continue NUVIGIL. 3) Fexofenadine should prevent drowsiness; continue 180 mg once a day.  4) Follow up with Dr. Halford Chessman in December as planned. Please schedule your Chest Xray  before appointment. 5) Follow up with Dr. Brett Fairy in 3 months, sooner as needed.   Last consult note : CD Alice Jones is a married, caucasian female, whose chief compliant is waking out of sleep with a sensation of choking . She has a history of diabetes mellitus, diagnosed at age 89 and DD disease in the neck, status post 3 fusions. The uses an misting agent , and year around allergy shots, allegra every day to teat the postnasal drip. This may contribute to the choking sensation. After the second neck  surgery she felt repeatedly as if choking.  She feels that the neck surgeries ar not cause for her  Sleep choking  Her spouse has reported witnessed snore and apnea  . The patient works as an Forensic psychologist.   The patient has no shift work history, and stays up on week ends a little longer - 12 midnight to 1 AM and she loves to sleep in until 12 noon. Her son (12)  is still in school , so she can't sleep in during the school year.  Usual sleep time is from midnight to 7 AM , about 6- hours of total sleep. Nocturia times 1 or none.  She needs an alarm, feels not refreshed in the morning. No dream enactment, normal " dreaming" , not vivid.  She wore retainers in childhood, has a crowded lower jaw. . No family history of a sleep disorder.    03-16-13:  [name] reports that she is at this time not in favor of a new sleep study and given the rather low yield to explain her daytime sleepiness or sleep choking with another polysomnography, I agree that we can hold off. I believe at this time it's more academic interest to perform the sleep study. She has been doing well with Nuvigil as needed and has not requested higher doses basically feeling that 100 mg have so far allowed her to lead a productive life. Her sleep choking has not really been changing it is less frequent but perhaps at this time but so she is also aware that there are multiple environmental allergy factors swelling of the postnasal soft pharynx and perhaps a delayed side effect of her neck surgery could be considered contributing factors. She is not as concerned about the sleep choking anymore. I will refill her armodafinil today she knows not to take her would offer no with caffeine as not to trigger any tremors or headaches.   REVIEW OF SYSTEMS: Full 14 system review of systems performed and notable only for: *snoring, carpal tunnel , choking. Hypersomnia,  2-20 6-16 the patient endorsed the Epworth score at 10 points and the fatigue severity  score at 38 points both are on the cusp to signifying hypersomnia but are not clinically significantly elevated as prior to MODAFINIL> . Marland Kitchen No morning headaches. No  Nocturia.    ALLERGIES: Allergies  Allergen Reactions  . Liraglutide     nausea    HOME MEDICATIONS: Outpatient Prescriptions Prior to Visit  Medication Sig Dispense Refill  . ACCU-CHEK SMARTVIEW test strip   11  . Armodafinil (NUVIGIL) 250 MG tablet Take one tab in AM without a caffeine. 30 tablet 5  . Azelastine-Fluticasone (DYMISTA) 137-50 MCG/ACT SUSP Place 2 sprays into the nose daily.     . cholecalciferol (VITAMIN D) 1000 UNITS tablet Take 1,000 Units by mouth daily.    . diphenhydrAMINE (BENADRYL) 25 MG tablet Take 12.5-25 mg by mouth at bedtime.     Marland Kitchen  fish oil-omega-3 fatty acids 1000 MG capsule Take 2 g by mouth daily.    Marland Kitchen levothyroxine (SYNTHROID, LEVOTHROID) 137 MCG tablet Take 137 mcg by mouth daily before breakfast.    . metFORMIN (GLUCOPHAGE-XR) 500 MG 24 hr tablet Take 1,000 mg by mouth 2 (two) times daily before a meal. Actually doing 500 AM and 1000 PM.    . Multiple Vitamins-Minerals (MULTIVITAMINS) CHEW Chew 1 tablet by mouth daily.     No facility-administered medications prior to visit.     Filed Vitals:   03/16/14 1141  BP: 130/81  Pulse: 75  Resp: 16  Height: _0  (1.575 m)  Weight: 164 lb (74.39 kg)   Body mass index is 29.99 kg/(m^2).  Physical exam:  General: The patient is awake, alert and appears not in acute distress. The patient is well groomed. Head: Normocephalic, atraumatic. Neck is supple. Mallampati 2, neck circumference: 15 inches.  No erythema and no inflammed mucous lining. Mild swelling of both nasial passages, no septal deviation.   Cardiovascular:  Regular rate and rhythm , without  murmurs or carotid bruit, and without distended neck veins. Respiratory: Lungs are clear to auscultation. Skin:  Without evidence of edema, or rash Trunk: BMI is  elevated and patient   has normal posture.  Neurologic exam : The patient is awake and alert, oriented to place and time.   There is a normal attention span & concentration ability. Speech is fluent  But nasal, without dysarthria,  aphasia.  Mood and affect are appropriate.  Cranial nerves: Pupils are equal and briskly reactive to light. Funduscopic exam without evidence of pallor or edema.  Extraocular movements  in vertical and horizontal planes intact and without nystagmus. Visual fields by finger perimetry are intact. Hearing to finger rub intact.  Facial sensation intact to fine touch. Facial motor strength is symmetric and tongue and uvula move midline.  Motor exam:   Normal tone ,muscle bulk and symmetric normal strength in all extremities. Sensory:  Fine touch, pinprick and vibration were tested in all extremities. Proprioception is  normal. Coordination: Rapid alternating movements in the fingers/hands is tested and normal. Finger-to-nose maneuver tested and normal without evidence of ataxia, dysmetria or tremor. Gait and station: Patient walks without assistive device . Deep tendon reflexes: in the  upper and lower extremities are symmetric and intact.  ASSESSMENT: After physical and neurologic examination, review of laboratory studies, imaging, neurophysiology testing and pre-existing records, assessment is:   Continue using saline nasal spray, use a non drowsiness causing antihistamine,  Fexofenadine prn allergy season. Benadryl at night.  I suggested to continue modafinil , and old off on sleep study for now.        03/16/2014, 12:00 PM [facility]  CC : Dr. Donneta Romberg.  CC  ; Dr Coralyn Mark [address] [place], [place] [address] [phone]  Note: This document was prepared with digital dictation and possible smart phrase technology. Any transcriptional errors that result from this process are unintentional.

## 2014-04-10 ENCOUNTER — Encounter: Payer: Self-pay | Admitting: Pulmonary Disease

## 2014-05-11 ENCOUNTER — Ambulatory Visit: Payer: Self-pay | Admitting: Pharmacist

## 2014-06-01 ENCOUNTER — Ambulatory Visit (INDEPENDENT_AMBULATORY_CARE_PROVIDER_SITE_OTHER): Payer: Self-pay | Admitting: Family Medicine

## 2014-06-01 ENCOUNTER — Ambulatory Visit: Payer: Self-pay | Admitting: Pharmacist

## 2014-06-01 VITALS — BP 124/68 | Ht 62.0 in | Wt 161.0 lb

## 2014-06-01 DIAGNOSIS — E119 Type 2 diabetes mellitus without complications: Secondary | ICD-10-CM

## 2014-06-01 NOTE — Progress Notes (Signed)
Subjective:  Patient presents today for 4 month diabetes follow-up as part of the employer-sponsored Link to Wellness program.  Current diabetes regimen includes metformin 500 mg every morning and 1000 mg every evening.   Most recent MD follow-up was with Dr. Chalmers Cater this week. A1C at her visit this week was 6.1 (increased from 5.7 at last visit with Dr. Chalmers Cater).   Patient has a pending appt for September with Dr. Chalmers Cater.  No med changes or major health changes at this time.   Bydureon was added at the most recent visit with Dr. Chalmers Cater. She picked it up from the pharmacy today. I explained how to mix up the pen and how to administer medication.   Visit was brief today as patient was 30 minutes late and had to leave early to pick up her son from school.    Assessment/Plan:  Patient is a 54  Year old female with DM 2. Most recent A1C was   6.1% which is at goal/ of less than 7%. Weight is decreased from last visit with me by 3 pounds. Patient states that she has tried to walk more during the day and has cut back on carbohydrate intake since her last visit. She believes this is the reason for her weight loss.   CBG Review: 7,14,30 day averages are increased from her last visit with me. Patient is concerned that she is seeing higher CBG values. I believe that this elevation is in part due to stress. 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 Of note, while she was on a cruise her CBGs were lower by 10-20 points.   Lifestyle improvements:  Physical Activity-  Patient is tracking steps with her apple watch. She is not engaging in any regular physical activity.   Nutrition-  She is still following a carbohydrate restricted diet. She states she would like to cook more but that often she is eating take out in the evenings as she is working until 8-9 PM. For main meals she tries to eat a lean protein with low carbohydrate vegetables. She is avoiding  bread, pasta and rice.   Follow up with me in 3 months.    Goals for Next Visit:  1. Start using Bydureon once a week.  2. Stick with your diet as it is now.     Next appointment to see me is Friday September 23rd at 3 PM.    Nayana Lenig D. Donneta Romberg, PharmD, BCPS, CDE [facility] to [place] Coordinator [phone]

## 2014-06-14 NOTE — Progress Notes (Signed)
ATTENDING PHYSICIAN NOTE: I have reviewed the chart and agree with the plan as detailed above. Sara Neal MD Pager [phone]  

## 2014-06-15 ENCOUNTER — Encounter: Payer: Self-pay | Admitting: Pharmacist

## 2014-07-16 ENCOUNTER — Other Ambulatory Visit: Payer: Self-pay

## 2014-10-12 ENCOUNTER — Ambulatory Visit: Payer: 59 | Admitting: Pharmacist

## 2014-10-12 ENCOUNTER — Other Ambulatory Visit: Payer: Self-pay | Admitting: Neurology

## 2014-10-15 ENCOUNTER — Other Ambulatory Visit: Payer: Self-pay

## 2014-10-15 DIAGNOSIS — G47419 Narcolepsy without cataplexy: Secondary | ICD-10-CM

## 2014-10-15 DIAGNOSIS — R0689 Other abnormalities of breathing: Secondary | ICD-10-CM

## 2014-10-15 DIAGNOSIS — J302 Other seasonal allergic rhinitis: Secondary | ICD-10-CM

## 2014-10-15 DIAGNOSIS — G4710 Hypersomnia, unspecified: Secondary | ICD-10-CM

## 2014-10-15 MED ORDER — ARMODAFINIL 250 MG PO TABS: ORAL_TABLET | ORAL | Status: DC

## 2014-10-26 ENCOUNTER — Ambulatory Visit: Payer: 59 | Admitting: Pharmacist

## 2014-10-26 ENCOUNTER — Ambulatory Visit (INDEPENDENT_AMBULATORY_CARE_PROVIDER_SITE_OTHER): Payer: Self-pay | Admitting: Pharmacist

## 2014-10-26 ENCOUNTER — Encounter: Payer: Self-pay | Admitting: Pharmacist

## 2014-10-26 VITALS — Ht 62.0 in | Wt 156.4 lb

## 2014-10-26 DIAGNOSIS — E119 Type 2 diabetes mellitus without complications: Secondary | ICD-10-CM

## 2014-10-26 NOTE — Progress Notes (Signed)
Subjective:  Patient presents today for 3 month diabetes follow-up as part of the employer-sponsored Link to Wellness program.  Current diabetes regimen includes Bydureon 2 mg SQ once weekly and metformin 1000 mg BID.    Patient reports no major changes since her last visit with me. Her last appointment with Dr. Chalmers Cater was in May and A1C at that visit was 6.1%. She had bloodwork drawn 9/16 and she reports that A1C had dropped to 5.7%.   Weight has dropped 5 pounds since her last visit with me. She recently started on Bydureon and reports that she has returned to a lower carbohydrate diet.   Patient reports that she needs to find a new PCP and asked for help in finding a PCP.   Assessment:  Diabetes: Most recent A1C was  5.7 % which is at goal of less than 7%. Weight is decreased from last visit with me.    CBG Review: CBG averages are 5-10 points lower than last visit.   Patient denies hypoglycemia. She states that lowest she has been was this morning, 77.   High.Low- 145/77  Lifestyle improvements:  Physical Activity-  She is not participating in any regular physical activity presently. She states that [place] she is walking more with the dogs.   Nutrition-  Patient states that she has returned to a lower carbohydrate diet. She is limiting bread, rice and pasta. She states that she is not as restrictive as she was previously. She is eating fruit and eating low carb wraps for lunch as a sandwich. She estimates that she is eating between 40-50 grams of carbohydrates daily.    Follow up with me in 3 months.    Plan/Goals for Next Visit:  1. Find a new primary care provider and set up an appointment to establish care. Look for someone within [facility] so you will have a zero copay. 2. Keep trying to increase step count and walk when you are in the mountains.     Next appointment to see me is: Friday February 10th at 3:30 PM.    Jinny Blossom D.  Donneta Romberg, PharmD, BCPS, CDE [place] [facility] to [place] Coordinator [phone]

## 2014-11-15 ENCOUNTER — Encounter: Payer: Self-pay | Admitting: Neurology

## 2014-11-15 ENCOUNTER — Ambulatory Visit (INDEPENDENT_AMBULATORY_CARE_PROVIDER_SITE_OTHER): Payer: 59 | Admitting: Neurology

## 2014-11-15 ENCOUNTER — Encounter: Payer: Self-pay | Admitting: *Deleted

## 2014-11-15 VITALS — BP 130/70 | HR 76 | Resp 16 | Ht 62.0 in | Wt 158.0 lb

## 2014-11-15 DIAGNOSIS — G47419 Narcolepsy without cataplexy: Secondary | ICD-10-CM

## 2014-11-15 DIAGNOSIS — J302 Other seasonal allergic rhinitis: Secondary | ICD-10-CM

## 2014-11-15 DIAGNOSIS — R0689 Other abnormalities of breathing: Secondary | ICD-10-CM

## 2014-11-15 DIAGNOSIS — G4710 Hypersomnia, unspecified: Secondary | ICD-10-CM

## 2014-11-15 MED ORDER — ARMODAFINIL 250 MG PO TABS: ORAL_TABLET | ORAL | Status: DC

## 2014-11-15 NOTE — Progress Notes (Signed)
Nuvigil rx. up front [facility]

## 2014-11-15 NOTE — Progress Notes (Signed)
PATIENT: Alice Jones DOB: 03/18/1960  REASON FOR VISIT: routine follow up for narcolepsy, EDS HISTORY FROM: patient  HISTORY OF PRESENT ILLNESS: Alice Jones is a 54 y.o. female , who is seen here as a referral from Dr. Reginia Forts, MD.   Prior Visit, CD from 10/13/13: Reviewing the sleep study dated 08-25-13 she presented with an Epworth sleepiness score of 17 points. She had barely audible snoring / audible breathing . Presents with an AHI of only 0.9 but she had prolonged periods of oxygen desaturation for total time at/ or under 88% 02 sat. Total time of 1 hour 39 minutes. Based on this, she will be  further evaluated by pulmonary function tests to see if oxygen saturation summary oxygen saturation is indicated. We also performed an HLA test after the patient's last visit, and this returned positive for narcolepsy.  The patient denies having any experience of multiple function loss or motor strength loss in relation to emotional upset or anger. She really has sleep attacks but she feels fatigued and excessively daytime sleepy. Her sleep study revealing nor apnea but prolonged desaturations to as only a limited explanation to her degree of sleepiness.  She returns for followup of narcolepsy. She is using Nuvigil 250 mg daily in the am, had some jittery feelings at first but has calmed down. Is helping with sleepiness as far as not needing to nap. Epworth still 14 - not big improvement. FSS is higher at 37. ONO with RA on 11/08/13 >> Test time 6 hrs 23 min. Baseline SpO2 91.8%, low SpO2 88%.   PFT 11/17/13 >> FEV1 2.96 (119%), FEV1% 89, TLC 4.73 (102%), DLCO 125%, no BD     chest xray  , was ordered at her visit with Dr. Halford Chessman,  returned normal.    Interval history from 10-20 7-16. I'm visiting today again with Alice Jones who continues to feel fatigued but also suffers from some viral illness. Fatigue severity score was 46 points and Epworth Sleepiness Scale was 11  points.She continues to feel that modafinil helps her to sustain alertness and wakefulness in daytime. It keeps a productive and she is safer when driving. It does not interfere with her nocturnal sleep.Since her narcolepsy blood test returned positive I inquired about symptoms of cataplexy, which she has all denied. There is no sleep paralysis, dream intrusion, hypnagogic or hypnopompic hallucinations and no cataplectic attacks.    REVIEW OF SYSTEMS: Full 14 system review of systems performed and notable only for: *snoring, carpal tunnel , choking. Hypersomnia,  2-20 6-16 the patient endorsed the Epworth score at 10 points and the fatigue severity score at  46 points both are on the cusp to signifying hypersomnia but are not clinically significantly elevated as prior to MODAFINIL well tolerated, All lab tests done through Dr. Chalmers Cater.  No morning headaches. No  Nocturia.    ALLERGIES: Allergies  Allergen Reactions  . Liraglutide     nausea    HOME MEDICATIONS: Outpatient Prescriptions Prior to Visit  Medication Sig Dispense Refill  . ACCU-CHEK SMARTVIEW test strip   11  . Armodafinil (NUVIGIL) 250 MG tablet Take one tab in AM without a caffeine. 30 tablet 5  . Azelastine-Fluticasone (DYMISTA) 137-50 MCG/ACT SUSP Place 2 sprays into the nose daily.     . cholecalciferol (VITAMIN D) 1000 UNITS tablet Take 1,000 Units by mouth daily.    . Coenzyme Q10 (COQ10) 100 MG CAPS Take 1 capsule by mouth daily.    Marland Kitchen  diphenhydrAMINE (BENADRYL) 25 MG tablet Take 12.5-25 mg by mouth at bedtime.     . Exenatide ER 2 MG PEN Inject 2 mg into the skin once a week.    . fish oil-omega-3 fatty acids 1000 MG capsule Take 2 g by mouth daily.    Marland Kitchen levothyroxine (SYNTHROID, LEVOTHROID) 137 MCG tablet Take 137 mcg by mouth daily before breakfast.    . metFORMIN (GLUCOPHAGE-XR) 500 MG 24 hr tablet Take 1,000 mg by mouth 2 (two) times daily before a meal. Actually doing 500 AM and 1000 PM.    . Multiple  Vitamins-Minerals (MULTIVITAMINS) CHEW Chew 1 tablet by mouth daily.    . NON FORMULARY Take 1 capsule by mouth daily. Tumeric     No facility-administered medications prior to visit.     Filed Vitals:   11/15/14 1347  BP: 130/70  Pulse: 76  Resp: 16  Height: '5\' 2"'  (1.575 m)  Weight: 158 lb (71.668 kg)   Body mass index is 28.89 kg/(m^2).  Physical exam:  General: The patient is awake, alert and appears not in acute distress. The patient is well groomed. Head: Normocephalic, atraumatic. Neck is supple. Mallampati 2, neck circumference: 15 inches.  No erythema and no inflammed mucous lining. Mild swelling of both nasial passages, no septal deviation.   Cardiovascular:  Regular rate and rhythm , without  murmurs or carotid bruit, and without distended neck veins. Respiratory: Lungs are clear to auscultation. Skin:  Without evidence of edema, or rash Trunk: BMI is  elevated and patient  has normal posture.  Neurologic exam : The patient is awake and alert, oriented to place and time.   There is a normal attention span & concentration ability. Speech is fluent but nasal speech , without dysarthria, nor aphasia.  Mood and affect are appropriate.  Cranial nerves: Pupils are equal and briskly reactive to light. Funduscopic exam without evidence of pallor or edema.  Extraocular movements  in vertical and horizontal planes intact and without nystagmus. Visual fields by finger perimetry are intact. Facial sensation intact to fine touch. Facial motor strength is symmetric and tongue and uvula move midline.  Motor exam:   Normal tone ,muscle bulk and symmetric normal strength in all extremities. Sensory:  Fine touch, pinprick and vibration were tested in all extremities. Proprioception is  normal. Coordination: Rapid alternating movements in the fingers/hands is tested and normal. Finger-to-nose maneuver tested and normal without evidence of ataxia, dysmetria or tremor. Gait and station:  Patient walks without assistive device . Deep tendon reflexes: in the upper and lower extremities are symmetric and intact.  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 EPWORTH at 11 points on Nuvigil.   Reviewing the sleep study dated 08-25-13 she presented with an Epworth sleepiness score of 17 points.  2) Continue using saline nasal spray, use a non drowsiness causing antihistamine,  Fexofenadine prn allergy season. Benadryl at night.  I suggested to continue modafinil , and hold off on any further  sleep study for now.  Should the patient develop cataplexy a change in medication needs to be discussed.   Kensy Blizard, MD   11/15/2014, 1:57 PM    [facility]  CC : Dr. Donneta Romberg.  CC  ; Dr Coralyn Mark [address] [place], [place] [address] [phone]

## 2014-11-16 ENCOUNTER — Ambulatory Visit: Payer: 59 | Admitting: Neurology

## 2015-01-24 NOTE — Clinical Documentation Improvement (Signed)
Discarded Rx dated 11/15/14 for Nuvigil. Patient did not pick-up at front desk.

## 2015-01-25 DIAGNOSIS — J3089 Other allergic rhinitis: Secondary | ICD-10-CM | POA: Diagnosis not present

## 2015-01-25 DIAGNOSIS — J301 Allergic rhinitis due to pollen: Secondary | ICD-10-CM | POA: Diagnosis not present

## 2015-02-01 MED FILL — BYDUREON 2 MG PEN INJECT: 2 | 90 days supply | Qty: 12 | Fill #3

## 2015-02-08 DIAGNOSIS — E039 Hypothyroidism, unspecified: Secondary | ICD-10-CM | POA: Diagnosis not present

## 2015-02-08 DIAGNOSIS — J301 Allergic rhinitis due to pollen: Secondary | ICD-10-CM | POA: Diagnosis not present

## 2015-02-08 DIAGNOSIS — J3089 Other allergic rhinitis: Secondary | ICD-10-CM | POA: Diagnosis not present

## 2015-02-13 MED FILL — METFORMIN HCL ER 500 MG TAB: 500 | 30 days supply | Qty: 120 | Fill #6

## 2015-02-13 MED FILL — NUVIGIL 250 MG TABLET: 250 | 30 days supply | Qty: 30 | Fill #4

## 2015-02-13 MED FILL — LEVOTHYROXINE 137 MCG TAB: 137 | 30 days supply | Qty: 30 | Fill #1

## 2015-02-14 DIAGNOSIS — K219 Gastro-esophageal reflux disease without esophagitis: Secondary | ICD-10-CM | POA: Diagnosis not present

## 2015-02-14 DIAGNOSIS — Z1211 Encounter for screening for malignant neoplasm of colon: Secondary | ICD-10-CM | POA: Diagnosis not present

## 2015-02-20 DIAGNOSIS — J3089 Other allergic rhinitis: Secondary | ICD-10-CM | POA: Diagnosis not present

## 2015-02-20 DIAGNOSIS — J301 Allergic rhinitis due to pollen: Secondary | ICD-10-CM | POA: Diagnosis not present

## 2015-02-28 DIAGNOSIS — J3089 Other allergic rhinitis: Secondary | ICD-10-CM | POA: Diagnosis not present

## 2015-02-28 DIAGNOSIS — J301 Allergic rhinitis due to pollen: Secondary | ICD-10-CM | POA: Diagnosis not present

## 2015-03-01 ENCOUNTER — Ambulatory Visit (INDEPENDENT_AMBULATORY_CARE_PROVIDER_SITE_OTHER): Payer: Self-pay | Admitting: Family Medicine

## 2015-03-01 ENCOUNTER — Encounter: Payer: Self-pay | Admitting: Pharmacist

## 2015-03-01 VITALS — BP 112/68 | Ht 62.0 in | Wt 159.4 lb

## 2015-03-01 DIAGNOSIS — E119 Type 2 diabetes mellitus without complications: Secondary | ICD-10-CM

## 2015-03-01 NOTE — Progress Notes (Signed)
Subjective:  Patient presents today for 4 month diabetes follow-up as part of the employer-sponsored Link to Wellness program.  Current diabetes regimen includes Bydureon 2 mg SQ once weekly, metformin ER 500 mg in the morning, 1000 mg in the evening.   Patient also continues on daily ASA, ACE Inhibitor and statin.  Most recent MD follow-up was with Dr. Chalmers Cater in November. Most recent A1C was 5.7% in September.  Patient has a pending appt for June 2017. No major health changes at this time.    One medication change- patient is now taking a supplement Elysium that claims to combat the effects of aging.    Assessment:  Diabetes: A1C today was  6.1  % which is at goal of less than 6.5%, but increased since her last visit with me. Weight is stable from last visit with me.    CBG Review: Patient is checking CBG twice daily- fasting and then again at bedtime.   High/Low- 165/88. Denies hypoglycemia.   CBG averages are about 10 points higher than last time.   Fasting CBGs are usually ranging in the low 100s.      Lifestyle improvements:  Physical Activity-  She states that [place] She estimates that she is getting at least 5000 steps daily. No other scheduled exercise besides that. She is walking the dogs some on the weekends. She is not currently interested in physical activity other than walking to the courthouse and occasionally walking her dogs.    Nutrition-  Patient has been partly following the Next 56 days diet and is avoiding processed food and eating a low carbohydrate diet.   She states that overall her diet is still low carbohydrate- she is aiming for 15-30 grams with meals.    Follow up with me in 4 months. I will send A1C results to Dr. Chalmers Cater.    Plan/Goals for Next Visit:  1. Find a new primary care provider and make an appointment to see him/her. Dr. Sharin Mons new phone number at [facility]- Phone: [phone] 2. Try  to get at least 5000 steps daily- even better if you can 6000 or more.     Next appointment to see me is: Friday June 16th at 4 PM.    Jinny Blossom D. Donneta Romberg, PharmD, BCPS, CDE [facility] to [place] Coordinator [phone]

## 2015-03-14 MED FILL — CONTOUR NEXT STRIPS: 33 days supply | Qty: 200 | Fill #0

## 2015-03-14 MED FILL — NUVIGIL 250 MG TABLET: 250 | 30 days supply | Qty: 30 | Fill #5

## 2015-03-14 MED FILL — DYMISTA NASAL SPRAY: 137-50 | 30 days supply | Qty: 23 | Fill #0

## 2015-03-14 MED FILL — LEVOTHYROXINE 137 MCG TAB: 137 | 30 days supply | Qty: 30 | Fill #2

## 2015-03-14 NOTE — Progress Notes (Signed)
I have reviewed this pharmacist's note and agree  

## 2015-03-20 DIAGNOSIS — J3089 Other allergic rhinitis: Secondary | ICD-10-CM | POA: Diagnosis not present

## 2015-03-20 DIAGNOSIS — J301 Allergic rhinitis due to pollen: Secondary | ICD-10-CM | POA: Diagnosis not present

## 2015-03-22 DIAGNOSIS — J301 Allergic rhinitis due to pollen: Secondary | ICD-10-CM | POA: Diagnosis not present

## 2015-03-22 DIAGNOSIS — J3089 Other allergic rhinitis: Secondary | ICD-10-CM | POA: Diagnosis not present

## 2015-03-29 DIAGNOSIS — J301 Allergic rhinitis due to pollen: Secondary | ICD-10-CM | POA: Diagnosis not present

## 2015-03-29 DIAGNOSIS — J3089 Other allergic rhinitis: Secondary | ICD-10-CM | POA: Diagnosis not present

## 2015-04-04 MED FILL — GAVILYTE-G SOLUTION: 236 | 1 days supply | Qty: 4000 | Fill #0

## 2015-04-05 DIAGNOSIS — K635 Polyp of colon: Secondary | ICD-10-CM | POA: Diagnosis not present

## 2015-04-05 DIAGNOSIS — D125 Benign neoplasm of sigmoid colon: Secondary | ICD-10-CM | POA: Diagnosis not present

## 2015-04-05 DIAGNOSIS — Z1211 Encounter for screening for malignant neoplasm of colon: Secondary | ICD-10-CM | POA: Diagnosis not present

## 2015-04-11 DIAGNOSIS — J301 Allergic rhinitis due to pollen: Secondary | ICD-10-CM | POA: Diagnosis not present

## 2015-04-11 DIAGNOSIS — J3089 Other allergic rhinitis: Secondary | ICD-10-CM | POA: Diagnosis not present

## 2015-04-12 ENCOUNTER — Other Ambulatory Visit: Payer: Self-pay | Admitting: Neurology

## 2015-04-12 DIAGNOSIS — J301 Allergic rhinitis due to pollen: Secondary | ICD-10-CM | POA: Diagnosis not present

## 2015-04-12 DIAGNOSIS — J3089 Other allergic rhinitis: Secondary | ICD-10-CM | POA: Diagnosis not present

## 2015-04-12 MED FILL — DYMISTA NASAL SPRAY: 137-50 | 30 days supply | Qty: 23 | Fill #0

## 2015-04-12 MED FILL — EPINEPHRINE 0.3 MG AUTO-INJ: 0.3 | 30 days supply | Qty: 2 | Fill #0

## 2015-04-12 MED FILL — METFORMIN HCL ER 500 MG TAB: 500 | 30 days supply | Qty: 120 | Fill #7

## 2015-04-12 MED FILL — LEVOTHYROXINE 137 MCG TAB: 137 | 30 days supply | Qty: 30 | Fill #3

## 2015-04-15 MED FILL — ARMODAFINIL 250 MG TABLET: 250 | 30 days supply | Qty: 30 | Fill #0

## 2015-04-17 DIAGNOSIS — J301 Allergic rhinitis due to pollen: Secondary | ICD-10-CM | POA: Diagnosis not present

## 2015-04-17 DIAGNOSIS — J3089 Other allergic rhinitis: Secondary | ICD-10-CM | POA: Diagnosis not present

## 2015-04-19 DIAGNOSIS — J301 Allergic rhinitis due to pollen: Secondary | ICD-10-CM | POA: Diagnosis not present

## 2015-04-19 DIAGNOSIS — J3089 Other allergic rhinitis: Secondary | ICD-10-CM | POA: Diagnosis not present

## 2015-04-23 MED FILL — BYDUREON 2 MG PEN INJECT: 2 | 90 days supply | Qty: 12 | Fill #4

## 2015-05-02 DIAGNOSIS — J301 Allergic rhinitis due to pollen: Secondary | ICD-10-CM | POA: Diagnosis not present

## 2015-05-02 DIAGNOSIS — J3089 Other allergic rhinitis: Secondary | ICD-10-CM | POA: Diagnosis not present

## 2015-05-09 DIAGNOSIS — J301 Allergic rhinitis due to pollen: Secondary | ICD-10-CM | POA: Diagnosis not present

## 2015-05-09 DIAGNOSIS — J3089 Other allergic rhinitis: Secondary | ICD-10-CM | POA: Diagnosis not present

## 2015-05-13 MED FILL — LEVOTHYROXINE 137 MCG TAB: 137 | 30 days supply | Qty: 30 | Fill #4

## 2015-05-15 MED FILL — METFORMIN HCL ER 500 MG TAB: 500 | 30 days supply | Qty: 120 | Fill #8

## 2015-05-15 MED FILL — ARMODAFINIL 250 MG TABLET: 250 | 30 days supply | Qty: 30 | Fill #1

## 2015-05-17 DIAGNOSIS — J301 Allergic rhinitis due to pollen: Secondary | ICD-10-CM | POA: Diagnosis not present

## 2015-05-17 DIAGNOSIS — J3089 Other allergic rhinitis: Secondary | ICD-10-CM | POA: Diagnosis not present

## 2015-05-30 DIAGNOSIS — J3089 Other allergic rhinitis: Secondary | ICD-10-CM | POA: Diagnosis not present

## 2015-05-30 DIAGNOSIS — J301 Allergic rhinitis due to pollen: Secondary | ICD-10-CM | POA: Diagnosis not present

## 2015-06-07 DIAGNOSIS — J3089 Other allergic rhinitis: Secondary | ICD-10-CM | POA: Diagnosis not present

## 2015-06-07 DIAGNOSIS — J301 Allergic rhinitis due to pollen: Secondary | ICD-10-CM | POA: Diagnosis not present

## 2015-06-13 DIAGNOSIS — J3089 Other allergic rhinitis: Secondary | ICD-10-CM | POA: Diagnosis not present

## 2015-06-13 DIAGNOSIS — J301 Allergic rhinitis due to pollen: Secondary | ICD-10-CM | POA: Diagnosis not present

## 2015-06-13 MED FILL — LEVOTHYROXINE 137 MCG TAB: 137 | 30 days supply | Qty: 30 | Fill #5

## 2015-06-13 MED FILL — ARMODAFINIL 250 MG TABLET: 250 | 30 days supply | Qty: 30 | Fill #2

## 2015-06-13 MED FILL — CONTOUR NEXT STRIPS: 33 days supply | Qty: 200 | Fill #1

## 2015-06-28 DIAGNOSIS — E1165 Type 2 diabetes mellitus with hyperglycemia: Secondary | ICD-10-CM | POA: Diagnosis not present

## 2015-06-28 DIAGNOSIS — E7800 Pure hypercholesterolemia, unspecified: Secondary | ICD-10-CM | POA: Diagnosis not present

## 2015-06-28 DIAGNOSIS — E039 Hypothyroidism, unspecified: Secondary | ICD-10-CM | POA: Diagnosis not present

## 2015-07-05 ENCOUNTER — Encounter: Payer: Self-pay | Admitting: Pharmacist

## 2015-07-05 ENCOUNTER — Other Ambulatory Visit: Payer: Self-pay | Admitting: Pharmacist

## 2015-07-05 VITALS — BP 122/70 | Wt 163.2 lb

## 2015-07-05 DIAGNOSIS — E7800 Pure hypercholesterolemia, unspecified: Secondary | ICD-10-CM | POA: Diagnosis not present

## 2015-07-05 DIAGNOSIS — E1165 Type 2 diabetes mellitus with hyperglycemia: Secondary | ICD-10-CM | POA: Diagnosis not present

## 2015-07-05 DIAGNOSIS — J301 Allergic rhinitis due to pollen: Secondary | ICD-10-CM | POA: Diagnosis not present

## 2015-07-05 DIAGNOSIS — J3089 Other allergic rhinitis: Secondary | ICD-10-CM | POA: Diagnosis not present

## 2015-07-05 DIAGNOSIS — E039 Hypothyroidism, unspecified: Secondary | ICD-10-CM | POA: Diagnosis not present

## 2015-07-05 DIAGNOSIS — R7309 Other abnormal glucose: Secondary | ICD-10-CM

## 2015-07-05 DIAGNOSIS — I10 Essential (primary) hypertension: Secondary | ICD-10-CM | POA: Diagnosis not present

## 2015-07-05 MED FILL — BYDUREON 2 MG PEN INJECT: 2 | 90 days supply | Qty: 12 | Fill #0

## 2015-07-05 MED FILL — LEVOTHYROXINE 137 MCG TAB: 137 | 90 days supply | Qty: 90 | Fill #0

## 2015-07-05 MED FILL — METFORMIN HCL ER 500 MG TAB: 500 | 90 days supply | Qty: 360 | Fill #0

## 2015-07-05 NOTE — Patient Outreach (Signed)
Subjective:  Patient presents today for 3 month diabetes follow-up as part of the employer-sponsored [employer] to [employer] program.  Current pre-diabetes regimen includes Bydureon 2 mg ER once weekly, metformin 1000 mg ER BID.  Most recent MD follow-up was with Dr. Chalmers Cater today. A1C was 6.4%. Patient has a pending appt for October with Dr. Chalmers Cater. No medication or major health changes at this time.   Assessment:  Pre-Diabetes: Most recent A1C was 6.4  % which is at goal of less than 6.5%. Weight is increased from last visit with me.    CBG Review: Patient is typically checking BID (fasting and evening). She did not bring her meter with her to the appointment, but she has her results in her phone.   Denies hypoglycemia.   High/Low- 152/89  Lifestyle improvements:  Physical Activity-  She is walking some (back and forth from [place] No additional physical activity. 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   She states that her step count is averaging between 7,000 and 10,000 steps daily.   Nutrition-  Patient states that she has been eating more carbohydrates than she usually does. She hasn't been counting each meal, but she estimates that she is eating 15 gram at breakfast, 20 grams at lunch (1 slice of whole grain bread with a sandwich), 20-30 grams with dinner (occasionally up to 40 grams). This would add up to about 50-60 grams per day. She is not cooking most of her meals. Her partner cooks some of the time, other times she is doing Psychologist, occupational.  She does state that she is eating some fried foods and would like to cut back. (She states that her cholesterol was elevated today at visit with Dr. Chalmers Cater and she has cautioned her to cut back on fat intake).   She is worried about the weight gain but given that she is already on a low carbohydrate diet I did not recommend that she cut back on carbohydrates  any further.    Health Maintenance- Patient still does not have a PCP. I stressed to patient the importance of having a PCP and having an annual exam. Strongly encouraged her to make an appointment before she comes back to see me. I showed her how to look for a [facility] provider and how to find a provider on the [facility] webpage.    Follow up with me in 6 months. She typically sees me every 4 months, but I will be on maternity leave at that time. I will see her after I return to work in December.    Plan/Goals for Next Visit:  1. Find a new PCP and make an appointment for your yearly physical exam.  2. Keep walking at work and aim to have 8-10,000 steps daily.  3. Cut back on fried food. Choose grilled or baked meats instead of fried.     Next appointment to see me is: Friday December 22nd at 4 PM.    Jinny Blossom D. Donneta Romberg, PharmD, BCPS, CDE [facility] to [place] Coordinator [phone]

## 2015-07-16 MED FILL — ARMODAFINIL 250 MG TABLET: 250 | 30 days supply | Qty: 30 | Fill #3

## 2015-07-17 DIAGNOSIS — J3089 Other allergic rhinitis: Secondary | ICD-10-CM | POA: Diagnosis not present

## 2015-07-17 DIAGNOSIS — J301 Allergic rhinitis due to pollen: Secondary | ICD-10-CM | POA: Diagnosis not present

## 2015-07-26 DIAGNOSIS — J301 Allergic rhinitis due to pollen: Secondary | ICD-10-CM | POA: Diagnosis not present

## 2015-07-26 DIAGNOSIS — J3089 Other allergic rhinitis: Secondary | ICD-10-CM | POA: Diagnosis not present

## 2015-07-31 DIAGNOSIS — J3089 Other allergic rhinitis: Secondary | ICD-10-CM | POA: Diagnosis not present

## 2015-07-31 DIAGNOSIS — J301 Allergic rhinitis due to pollen: Secondary | ICD-10-CM | POA: Diagnosis not present

## 2015-08-09 DIAGNOSIS — J301 Allergic rhinitis due to pollen: Secondary | ICD-10-CM | POA: Diagnosis not present

## 2015-08-09 DIAGNOSIS — J3089 Other allergic rhinitis: Secondary | ICD-10-CM | POA: Diagnosis not present

## 2015-08-13 DIAGNOSIS — J3089 Other allergic rhinitis: Secondary | ICD-10-CM | POA: Diagnosis not present

## 2015-08-13 DIAGNOSIS — J301 Allergic rhinitis due to pollen: Secondary | ICD-10-CM | POA: Diagnosis not present

## 2015-08-15 MED FILL — ARMODAFINIL 250 MG TABLET: 250 | 30 days supply | Qty: 30 | Fill #4

## 2015-08-22 DIAGNOSIS — J3089 Other allergic rhinitis: Secondary | ICD-10-CM | POA: Diagnosis not present

## 2015-08-22 DIAGNOSIS — J301 Allergic rhinitis due to pollen: Secondary | ICD-10-CM | POA: Diagnosis not present

## 2015-08-30 DIAGNOSIS — R030 Elevated blood-pressure reading, without diagnosis of hypertension: Secondary | ICD-10-CM | POA: Diagnosis not present

## 2015-08-30 DIAGNOSIS — G5603 Carpal tunnel syndrome, bilateral upper limbs: Secondary | ICD-10-CM | POA: Diagnosis not present

## 2015-08-30 DIAGNOSIS — Z6830 Body mass index (BMI) 30.0-30.9, adult: Secondary | ICD-10-CM | POA: Diagnosis not present

## 2015-08-30 DIAGNOSIS — M778 Other enthesopathies, not elsewhere classified: Secondary | ICD-10-CM | POA: Diagnosis not present

## 2015-08-30 DIAGNOSIS — M542 Cervicalgia: Secondary | ICD-10-CM | POA: Diagnosis not present

## 2015-08-30 MED FILL — DICLOFENAC SOD EC 75 MG TAB: 75 | 30 days supply | Qty: 60 | Fill #0

## 2015-09-06 DIAGNOSIS — J3089 Other allergic rhinitis: Secondary | ICD-10-CM | POA: Diagnosis not present

## 2015-09-06 DIAGNOSIS — J301 Allergic rhinitis due to pollen: Secondary | ICD-10-CM | POA: Diagnosis not present

## 2015-09-12 DIAGNOSIS — J3089 Other allergic rhinitis: Secondary | ICD-10-CM | POA: Diagnosis not present

## 2015-09-12 DIAGNOSIS — J301 Allergic rhinitis due to pollen: Secondary | ICD-10-CM | POA: Diagnosis not present

## 2015-09-16 ENCOUNTER — Encounter: Payer: Self-pay | Admitting: Neurology

## 2015-09-16 DIAGNOSIS — G5603 Carpal tunnel syndrome, bilateral upper limbs: Secondary | ICD-10-CM | POA: Diagnosis not present

## 2015-09-16 DIAGNOSIS — Z6829 Body mass index (BMI) 29.0-29.9, adult: Secondary | ICD-10-CM | POA: Diagnosis not present

## 2015-09-16 DIAGNOSIS — M778 Other enthesopathies, not elsewhere classified: Secondary | ICD-10-CM | POA: Diagnosis not present

## 2015-09-18 MED FILL — ARMODAFINIL 250 MG TABLET: 250 | 30 days supply | Qty: 30 | Fill #5

## 2015-10-04 DIAGNOSIS — J301 Allergic rhinitis due to pollen: Secondary | ICD-10-CM | POA: Diagnosis not present

## 2015-10-04 DIAGNOSIS — J3089 Other allergic rhinitis: Secondary | ICD-10-CM | POA: Diagnosis not present

## 2015-10-09 DIAGNOSIS — J3089 Other allergic rhinitis: Secondary | ICD-10-CM | POA: Diagnosis not present

## 2015-10-09 DIAGNOSIS — J301 Allergic rhinitis due to pollen: Secondary | ICD-10-CM | POA: Diagnosis not present

## 2015-10-09 MED FILL — BYDUREON 2 MG PEN INJECT: 2 | 90 days supply | Qty: 12 | Fill #1

## 2015-10-09 MED FILL — MELOXICAM 7.5 MG TABLET: 7.5 | 30 days supply | Qty: 30 | Fill #1

## 2015-10-09 MED FILL — LEVOTHYROXINE 137 MCG TAB: 137 | 30 days supply | Qty: 30 | Fill #1

## 2015-10-09 MED FILL — METFORMIN HCL ER 500 MG TAB: 500 | 90 days supply | Qty: 360 | Fill #1

## 2015-10-17 ENCOUNTER — Other Ambulatory Visit: Payer: Self-pay | Admitting: Neurology

## 2015-10-17 MED FILL — ARMODAFINIL 250 MG TABLET: 250 | 30 days supply | Qty: 30 | Fill #0

## 2015-10-17 NOTE — Telephone Encounter (Signed)
RX for armodafinil faxed to [facility]. Received a receipt of confirmation.

## 2015-10-18 DIAGNOSIS — J3089 Other allergic rhinitis: Secondary | ICD-10-CM | POA: Diagnosis not present

## 2015-10-18 DIAGNOSIS — J301 Allergic rhinitis due to pollen: Secondary | ICD-10-CM | POA: Diagnosis not present

## 2015-10-21 DIAGNOSIS — M7711 Lateral epicondylitis, right elbow: Secondary | ICD-10-CM | POA: Diagnosis not present

## 2015-11-01 DIAGNOSIS — J3089 Other allergic rhinitis: Secondary | ICD-10-CM | POA: Diagnosis not present

## 2015-11-01 DIAGNOSIS — J301 Allergic rhinitis due to pollen: Secondary | ICD-10-CM | POA: Diagnosis not present

## 2015-11-03 HISTORY — PX: CARPAL TUNNEL RELEASE: SHX101

## 2015-11-04 DIAGNOSIS — G5603 Carpal tunnel syndrome, bilateral upper limbs: Secondary | ICD-10-CM | POA: Diagnosis not present

## 2015-11-04 DIAGNOSIS — G5601 Carpal tunnel syndrome, right upper limb: Secondary | ICD-10-CM | POA: Diagnosis not present

## 2015-11-04 MED FILL — HYDROCODON-APAP 5-325: 5-325 | 2 days supply | Qty: 25 | Fill #0

## 2015-11-04 MED FILL — LEVOTHYROXINE 137 MCG TAB: 137 | 90 days supply | Qty: 90 | Fill #2

## 2015-11-14 DIAGNOSIS — E7800 Pure hypercholesterolemia, unspecified: Secondary | ICD-10-CM | POA: Diagnosis not present

## 2015-11-14 DIAGNOSIS — E039 Hypothyroidism, unspecified: Secondary | ICD-10-CM | POA: Diagnosis not present

## 2015-11-14 DIAGNOSIS — E1165 Type 2 diabetes mellitus with hyperglycemia: Secondary | ICD-10-CM | POA: Diagnosis not present

## 2015-11-14 DIAGNOSIS — I10 Essential (primary) hypertension: Secondary | ICD-10-CM | POA: Diagnosis not present

## 2015-11-18 MED FILL — ARMODAFINIL 250 MG TABLET: 250 | 30 days supply | Qty: 30 | Fill #1

## 2015-11-19 ENCOUNTER — Ambulatory Visit: Payer: 59 | Admitting: Neurology

## 2015-11-22 DIAGNOSIS — T814XXA Infection following a procedure, initial encounter: Secondary | ICD-10-CM | POA: Diagnosis not present

## 2015-11-22 DIAGNOSIS — M7711 Lateral epicondylitis, right elbow: Secondary | ICD-10-CM | POA: Diagnosis not present

## 2015-11-22 MED FILL — CEPHALEXIN 500 MG CAPSULE: 500 | 7 days supply | Qty: 28 | Fill #0

## 2015-11-26 ENCOUNTER — Encounter: Payer: Self-pay | Admitting: Neurology

## 2015-11-26 ENCOUNTER — Ambulatory Visit (INDEPENDENT_AMBULATORY_CARE_PROVIDER_SITE_OTHER): Payer: 59 | Admitting: Neurology

## 2015-11-26 VITALS — BP 118/78 | HR 80 | Resp 20 | Ht 62.0 in | Wt 162.0 lb

## 2015-11-26 DIAGNOSIS — G47419 Narcolepsy without cataplexy: Secondary | ICD-10-CM

## 2015-11-26 DIAGNOSIS — M6281 Muscle weakness (generalized): Secondary | ICD-10-CM | POA: Diagnosis not present

## 2015-11-26 DIAGNOSIS — M25531 Pain in right wrist: Secondary | ICD-10-CM | POA: Diagnosis not present

## 2015-11-26 DIAGNOSIS — M25431 Effusion, right wrist: Secondary | ICD-10-CM | POA: Diagnosis not present

## 2015-11-26 DIAGNOSIS — G5601 Carpal tunnel syndrome, right upper limb: Secondary | ICD-10-CM | POA: Diagnosis not present

## 2015-11-26 MED ORDER — ARMODAFINIL 250 MG PO TABS: ORAL_TABLET | ORAL | 5 refills | Status: DC

## 2015-11-26 NOTE — Progress Notes (Signed)
PATIENT: Alice Jones DOB: November 25, 1960  REASON FOR VISIT: routine follow up for narcolepsy, EDS HISTORY FROM: patient  HISTORY OF PRESENT ILLNESS: Alice Jones is a 55 y.o. female , who is seen here as a referral from Dr. Reginia Forts, MD.   Prior Visit, CD from 10/13/13: Reviewing the sleep study dated 08-25-13 she presented with an Epworth sleepiness score of 17 points. She had barely audible snoring / audible breathing . Presents with an AHI of only 0.9 but she had prolonged periods of oxygen desaturation for total time at/ or under 88% 02 sat. Total time of 1 hour 39 minutes. Based on this, she will be  further evaluated by pulmonary function tests to see if oxygen saturation summary oxygen saturation is indicated. We also performed an HLA test after the patient's last visit, and this returned positive for narcolepsy.  The patient denies having any experience of multiple function loss or motor strength loss in relation to emotional upset or anger. She really has sleep attacks but she feels fatigued and excessively daytime sleepy. Her sleep study revealing nor apnea but prolonged desaturations to as only a limited explanation to her degree of sleepiness.  She returns for followup of narcolepsy. She is using Nuvigil 250 mg daily in the am, had some jittery feelings at first but has calmed down. Is helping with sleepiness as far as not needing to nap. Epworth still 14 - not big improvement. FSS is higher at 37. ONO with RA on 11/08/13 >> Test time 6 hrs 23 min. Baseline SpO2 91.8%, low SpO2 88%.   PFT 11/17/13 >> FEV1 2.96 (119%), FEV1% 89, TLC 4.73 (102%), DLCO 125%, no BD     chest xray  , was ordered at her visit with Dr. Halford Chessman,  returned normal.    Interval history from 11-15-14. I'm visiting today again with Alice Jones who continues to feel fatigued but also suffers from some viral illness. Fatigue severity score was 46 points and Epworth Sleepiness Scale was 11  points.She continues to feel that modafinil helps her to sustain alertness and wakefulness in daytime. It keeps a productive and she is safer when driving. It does not interfere with her nocturnal sleep.Since her narcolepsy blood test returned positive I inquired about symptoms of cataplexy, which she has all denied. There is no sleep paralysis, dream intrusion, hypnagogic or hypnopompic hallucinations and no cataplectic attacks.  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 The patient had 1 medical development in the interval she had surgery for carpal tunnel syndrome on the right hand and experienced delayed wound healing. Her fatigue severity score was endorsed at 32 points and her Epworth sleepiness score at 11 points    REVIEW OF SYSTEMS: Full 14 system review of systems performed and notable only for: *snoring, carpal tunnel , choking. Hypersomnia,   MODAFINIL well tolerated, All lab tests done through Dr. Chalmers Cater. " sinking spells" worked up by endocrinologist , now referred to cardiology. No morning headaches. No  Nocturia.    ALLERGIES: Allergies  Allergen Reactions  . Liraglutide     nausea    HOME MEDICATIONS: Outpatient Medications Prior to Visit  Medication Sig Dispense Refill  . ACCU-CHEK SMARTVIEW test strip   11  . Armodafinil 250 MG tablet TAKE 1 TABLET BY MOUTH EVERY MORNING WITHOUT CAFFEINE 30 tablet 5  . Azelastine-Fluticasone (DYMISTA) 137-50 MCG/ACT SUSP Place 2 sprays  into the nose daily.     . cholecalciferol (VITAMIN D) 1000 UNITS tablet Take 1,000 Units by mouth daily.    . Coenzyme Q10 (COQ10) 100 MG CAPS Take 1 capsule by mouth daily.    . diphenhydrAMINE (BENADRYL) 25 MG tablet Take 12.5-25 mg by mouth at bedtime.     . Esomeprazole Magnesium (NEXIUM PO) Take 20 mg by mouth daily.     . Exenatide ER 2 MG PEN Inject 2 mg into the skin once a  week.    . fish oil-omega-3 fatty acids 1000 MG capsule Take 2 g by mouth daily.    Marland Kitchen levothyroxine (SYNTHROID, LEVOTHROID) 137 MCG tablet Take 137 mcg by mouth daily before breakfast.    . metFORMIN (GLUCOPHAGE-XR) 500 MG 24 hr tablet Take 1,000 mg by mouth 2 (two) times daily with a meal. Actually doing 500 AM and 1000 PM.    . Multiple Vitamins-Minerals (MULTIVITAMINS) CHEW Chew 1 tablet by mouth daily.    . NON FORMULARY Take 1 capsule by mouth daily. Tumeric    . NON FORMULARY Take 2 capsules by mouth daily. Elysium supplement     No facility-administered medications prior to visit.      Vitals:   11/26/15 1331  BP: 118/78  Pulse: 80  Resp: 20  Weight: 162 lb (73.5 kg)  Height: 5\' 2"  (1.575 m)   Body mass index is 29.63 kg/m.  Physical exam:  General: The patient is awake, alert and appears not in acute distress. The patient is well groomed. Head: Normocephalic, atraumatic. Neck is supple. Mallampati 2, neck circumference: 15 inches.  No erythema and no inflammed mucous lining. Mild swelling of both nasial passages, no septal deviation.   Neurologic exam : The patient is awake and alert, oriented to place and time.  There is a normal attention span & concentration ability. Speech is fluent but nasal speech , without dysarthria, nor aphasia. Mood and affect are appropriate. Cranial nerves: Pupils are equal and briskly reactive to light. Funduscopic exam without evidence of pallor or edema. Extraocular movements  in vertical and horizontal planes intact and without nystagmus. Visual fields by finger perimetry are intact. Facial sensation intact to fine touch. Facial motor strength is symmetric and tongue and uvula move midline. Motor exam:   Normal tone ,muscle bulk and symmetric normal strength in all extremities.   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 EPWORTH again at 11 points on Nuvigil.  Refilled.  Reviewing the sleep study dated 08-25-13 she presented with an Epworth sleepiness score of 17 points.  2) Continue using saline nasal spray, use a non drowsiness- causing antihistamine,  Fexofenadine prn allergy season. Benadryl at night. I suggested to continue modafinil , and hold off on any further sleep study for now.   3) diabetes mellitus with delayed wound healing. Carpal tunnel surgery on the right not healing well.    A change in medication needs to be discussed should the patient develop cataplexy. No such development now.    Jariah Tarkowski, MD   11/26/2015, 1:51 PM    [facility]  CC : Dr. 13/07/2015.  CC  ; Dr Shepherd Callas [address] [place],  [place] [address] [phone]

## 2015-11-28 ENCOUNTER — Other Ambulatory Visit: Payer: Self-pay | Admitting: Orthopaedic Surgery

## 2015-11-28 DIAGNOSIS — M25521 Pain in right elbow: Secondary | ICD-10-CM

## 2015-11-28 DIAGNOSIS — J3089 Other allergic rhinitis: Secondary | ICD-10-CM | POA: Diagnosis not present

## 2015-11-28 DIAGNOSIS — J301 Allergic rhinitis due to pollen: Secondary | ICD-10-CM | POA: Diagnosis not present

## 2015-11-29 DIAGNOSIS — E119 Type 2 diabetes mellitus without complications: Secondary | ICD-10-CM | POA: Diagnosis not present

## 2015-11-29 DIAGNOSIS — H40033 Anatomical narrow angle, bilateral: Secondary | ICD-10-CM | POA: Diagnosis not present

## 2015-11-29 DIAGNOSIS — H11823 Conjunctivochalasis, bilateral: Secondary | ICD-10-CM | POA: Diagnosis not present

## 2015-11-29 MED FILL — CEPHALEXIN 500 MG CAPSULE: 500 | 7 days supply | Qty: 28 | Fill #0

## 2015-12-02 ENCOUNTER — Ambulatory Visit
Admission: RE | Admit: 2015-12-02 | Discharge: 2015-12-02 | Disposition: A | Discharge status: Home / Self Care | Payer: 59 | Source: Ambulatory Visit | Attending: Orthopaedic Surgery | Admitting: Orthopaedic Surgery

## 2015-12-02 DIAGNOSIS — M25521 Pain in right elbow: Secondary | ICD-10-CM

## 2015-12-02 DIAGNOSIS — S56811A Strain of other muscles, fascia and tendons at forearm level, right arm, initial encounter: Secondary | ICD-10-CM | POA: Diagnosis not present

## 2015-12-03 ENCOUNTER — Ambulatory Visit (INDEPENDENT_AMBULATORY_CARE_PROVIDER_SITE_OTHER): Payer: 59 | Admitting: Cardiovascular Disease

## 2015-12-03 ENCOUNTER — Encounter: Payer: Self-pay | Admitting: Cardiovascular Disease

## 2015-12-03 VITALS — BP 131/82 | HR 77 | Ht 62.0 in | Wt 168.0 lb

## 2015-12-03 DIAGNOSIS — M25431 Effusion, right wrist: Secondary | ICD-10-CM | POA: Diagnosis not present

## 2015-12-03 DIAGNOSIS — M6281 Muscle weakness (generalized): Secondary | ICD-10-CM | POA: Diagnosis not present

## 2015-12-03 DIAGNOSIS — G5601 Carpal tunnel syndrome, right upper limb: Secondary | ICD-10-CM | POA: Diagnosis not present

## 2015-12-03 DIAGNOSIS — M25531 Pain in right wrist: Secondary | ICD-10-CM | POA: Diagnosis not present

## 2015-12-03 DIAGNOSIS — R0789 Other chest pain: Secondary | ICD-10-CM | POA: Diagnosis not present

## 2015-12-03 DIAGNOSIS — R0602 Shortness of breath: Secondary | ICD-10-CM | POA: Diagnosis not present

## 2015-12-03 NOTE — Patient Instructions (Signed)
Medication Instructions:  Your physician recommends that you continue on your current medications as directed. Please refer to the Current Medication list given to you today.   Labwork: NONE   Testing/Procedures: Your physician has requested that you have en exercise stress myoview. For further information please visit [email or web address]. Please follow instruction sheet, as given.  Follow-Up: AS NEEDED   

## 2015-12-03 NOTE — Progress Notes (Signed)
Cardiology Office Note   Date:  12/03/2015   ID:  Alice Jones, DOB 1960/03/11, MRN [id number]  PCP:  Kelton Pillar, MD  Cardiologist:   Skeet Latch, MD   Chief Complaint  Patient presents with  . New Patient (Initial Visit)  . Dizziness    occassionally.      History of Present Illness: Alice Jones is a 55 y.o. female with diabetes, hyperlipidemia and narcolepsy who presents for an evaluation of dizziness. Alice Jones reports approximately six months of feels poorly with exertion.  She notes that with physical activity she starts feeling bad.  It gets better after she stops and east.  She is unable to fully describe these episodes but denies chest pain or shortness of breath.  It makes her feel anxious and slightly lightheaded but not presyncopal.  She feels uncomfortable all over and very fatigued.  She has checked her blood glucose and it is typically in the 90s when this occurs.  She describes them as "sinking spells."  She described these to her neurologist, Dr. Brett Fairy, and was referred to cardiology for evaluation.  Alice Jones does not exercise regularly.  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  The episodes last for a few hours before she starts to feel normal again.  Since the summer it has happened a few other times.    Alice Jones does not get any regular exercise.  She does go walking sometimes and this doesn't elicit the above symptoms.  She denies lower extremity edema, orthopnea or PND.   Past Medical History:  Diagnosis Date  . Allergic rhinitis   . Carpal tunnel syndrome   . Diabetes mellitus    Dr. Chalmers Cater  . Herniated cervical disc   . Hyperlipemia   . Hypothyroid   . Narcolepsy   . Thyroid disease     Past Surgical History:  Procedure Laterality Date  . APPENDECTOMY  09/2009  . CERVICAL FUSION  12/09, 12/10   C6/7, C5/6  . REFRACTIVE SURGERY Left 09/2013  . SHOULDER SURGERY       Right     Current Outpatient Prescriptions  Medication Sig Dispense Refill  . ACCU-CHEK SMARTVIEW test strip   11  . Armodafinil 250 MG tablet TAKE 1 TABLET BY MOUTH EVERY MORNING WITHOUT CAFFEINE 30 tablet 5  . Azelastine-Fluticasone (DYMISTA) 137-50 MCG/ACT SUSP Place 2 sprays into the nose daily.     . cephALEXin (KEFLEX) 500 MG capsule Take by mouth 4 (four) times daily.  0  . cholecalciferol (VITAMIN D) 1000 UNITS tablet Take 1,000 Units by mouth daily.    . Coenzyme Q10 (COQ10) 100 MG CAPS Take 1 capsule by mouth daily.    . diphenhydrAMINE (BENADRYL) 25 MG tablet Take 12.5-25 mg by mouth at bedtime.     . Esomeprazole Magnesium (NEXIUM PO) Take 20 mg by mouth daily.     . Exenatide ER 2 MG PEN Inject 2 mg into the skin once a week.    . fish oil-omega-3 fatty acids 1000 MG capsule Take 2 g by mouth daily.    Marland Kitchen levothyroxine (SYNTHROID, LEVOTHROID) 137 MCG tablet Take 137 mcg by mouth daily before breakfast.    . metFORMIN (GLUCOPHAGE-XR) 500 MG 24 hr tablet Take 1,000 mg by mouth 2 (two) times daily with a meal. Actually doing 500 AM and 1000 PM.    . Multiple Vitamins-Minerals (MULTIVITAMINS) CHEW Chew 1 tablet by mouth daily.    Marland Kitchen  NON FORMULARY Take 1 capsule by mouth daily. Tumeric    . NON FORMULARY Take 2 capsules by mouth daily. Elysium supplement     No current facility-administered medications for this visit.     Allergies:   Liraglutide    Social History:  The patient  reports that she has never smoked. She has never used smokeless tobacco. She reports that she drinks alcohol. She reports that she does not use drugs.   Family History:  The patient's family history includes Cancer in her mother; Diabetes in her maternal grandfather.    ROS:  Please see the history of present illness.   Otherwise, review of systems are positive for none.   All other systems are reviewed and negative.    PHYSICAL EXAM: VS:  BP 131/82   Pulse 77   Ht 5\' 2"  (1.575 m)   Wt 76.2 kg  (168 lb)   BMI 30.73 kg/m  , BMI Body mass index is 30.73 kg/m. GENERAL:  Well appearing HEENT:  Pupils equal round and reactive, fundi not visualized, oral mucosa unremarkable NECK:  No jugular venous distention, waveform within normal limits, carotid upstroke brisk and symmetric, no bruits, no thyromegaly LYMPHATICS:  No cervical adenopathy LUNGS:  Clear to auscultation bilaterally HEART:  RRR.  PMI not displaced or sustained,S1 and S2 within normal limits, no S3, no S4, no clicks, no rubs, no murmurs ABD:  Flat, positive bowel sounds normal in frequency in pitch, no bruits, no rebound, no guarding, no midline pulsatile mass, no hepatomegaly, no splenomegaly EXT:  2 plus pulses throughout, no edema, no cyanosis no clubbing SKIN:  No rashes no nodules NEURO:  Cranial nerves II through XII grossly intact, motor grossly intact throughout PSYCH:  Cognitively intact, oriented to person place and time    EKG:  EKG is ordered today. The ekg ordered today demonstrates sinus rhythm rate 77 bpm.  Incomplete RBBB.    Recent Labs: No results found for requested labs within last 8760 hours.    Lipid Panel    Component Value Date/Time   CHOL 227 (H) 02/09/2013 1426   TRIG 498 (H) 02/09/2013 1426   HDL 59 02/09/2013 1426   CHOLHDL 3.8 02/09/2013 1426   VLDL NOT CALC 02/09/2013 1426   LDLCALC  02/09/2013 1426     Comment:       Not calculated due to Triglyceride >400. Suggest ordering Direct LDL (Unit Code: [phone]).   Total Cholesterol/HDL Ratio:CHD Risk                        Coronary Heart Disease Risk Table                                        Men       Women          1/2 Average Risk              3.4        3.3              Average Risk              5.0        4.4           2X Average Risk              9.6  7.1           3X Average Risk             23.4       11.0 Use the calculated Patient Ratio above and the CHD Risk table  to determine the patient's CHD Risk. ATP III  Classification (LDL):       < 100        mg/dL         Optimal      100 - 129     mg/dL         Near or Above Optimal      130 - 159     mg/dL         Borderline High      160 - 189     mg/dL         High       > 190        mg/dL         Very High     6/ Total chol 222, Tri 144, HDL 73, LDL ?     Wt Readings from Last 3 Encounters:  12/03/15 76.2 kg (168 lb)  11/26/15 73.5 kg (162 lb)  07/05/15 74 kg (163 lb 3.2 oz)      ASSESSMENT AND PLAN:  # Fatigue: # Atypical chest pain: Ms. Almaraz symptoms are very atypical.  She doesn't have any palpitations or presyncope, making arrhythmia unlikely.  It could be an atypical presentation of ischemia, given that it occurs with exertion.  We will get an exercise Myoview to assess.     Current medicines are reviewed at length with the patient today.  The patient does not have concerns regarding medicines.  The following changes have been made:  no change  Labs/ tests ordered today include:   Orders Placed This Encounter  Procedures  . Myocardial Perfusion Imaging  . EKG 12-Lead     Disposition:   FU with Iram Astorino C. Oval Linsey, MD, [facility] as needed.     This note was written with the assistance of speech recognition software.  Please excuse any transcriptional errors.  Signed, Brynlee Pennywell C. Oval Linsey, MD, [facility]  12/03/2015 9:03 PM    [place]

## 2015-12-09 DIAGNOSIS — M7711 Lateral epicondylitis, right elbow: Secondary | ICD-10-CM | POA: Diagnosis not present

## 2015-12-10 ENCOUNTER — Ambulatory Visit: Payer: 59 | Admitting: Interventional Cardiology

## 2015-12-10 DIAGNOSIS — M6281 Muscle weakness (generalized): Secondary | ICD-10-CM | POA: Diagnosis not present

## 2015-12-10 DIAGNOSIS — G5601 Carpal tunnel syndrome, right upper limb: Secondary | ICD-10-CM | POA: Diagnosis not present

## 2015-12-10 DIAGNOSIS — M25431 Effusion, right wrist: Secondary | ICD-10-CM | POA: Diagnosis not present

## 2015-12-10 DIAGNOSIS — M25531 Pain in right wrist: Secondary | ICD-10-CM | POA: Diagnosis not present

## 2015-12-16 DIAGNOSIS — M6281 Muscle weakness (generalized): Secondary | ICD-10-CM | POA: Diagnosis not present

## 2015-12-16 DIAGNOSIS — M25531 Pain in right wrist: Secondary | ICD-10-CM | POA: Diagnosis not present

## 2015-12-16 DIAGNOSIS — G5601 Carpal tunnel syndrome, right upper limb: Secondary | ICD-10-CM | POA: Diagnosis not present

## 2015-12-16 DIAGNOSIS — M25431 Effusion, right wrist: Secondary | ICD-10-CM | POA: Diagnosis not present

## 2015-12-16 DIAGNOSIS — J301 Allergic rhinitis due to pollen: Secondary | ICD-10-CM | POA: Diagnosis not present

## 2015-12-16 DIAGNOSIS — J3089 Other allergic rhinitis: Secondary | ICD-10-CM | POA: Diagnosis not present

## 2015-12-17 MED FILL — ARMODAFINIL 250 MG TABLET: 250 | 30 days supply | Qty: 30 | Fill #2

## 2015-12-17 MED FILL — CONTOUR NEXT STRIPS: 33 days supply | Qty: 200 | Fill #2

## 2015-12-18 ENCOUNTER — Telehealth: Payer: Self-pay | Admitting: Cardiovascular Disease

## 2015-12-18 NOTE — Telephone Encounter (Signed)
06/28/15 Sodium137, potassium 4.9, BUN 1, creatinine 0.77  Hemolobin A1c 6.3% Total cholesterol 222, HDL  LDL 120, triglycerides 144 TSH 2.18

## 2015-12-25 ENCOUNTER — Telehealth (HOSPITAL_COMMUNITY): Payer: Self-pay

## 2015-12-25 DIAGNOSIS — J3089 Other allergic rhinitis: Secondary | ICD-10-CM | POA: Diagnosis not present

## 2015-12-25 DIAGNOSIS — J301 Allergic rhinitis due to pollen: Secondary | ICD-10-CM | POA: Diagnosis not present

## 2015-12-25 NOTE — Telephone Encounter (Signed)
Encounter complete. 

## 2015-12-26 DIAGNOSIS — M6281 Muscle weakness (generalized): Secondary | ICD-10-CM | POA: Diagnosis not present

## 2015-12-26 DIAGNOSIS — M25431 Effusion, right wrist: Secondary | ICD-10-CM | POA: Diagnosis not present

## 2015-12-26 DIAGNOSIS — G5601 Carpal tunnel syndrome, right upper limb: Secondary | ICD-10-CM | POA: Diagnosis not present

## 2015-12-26 DIAGNOSIS — M25531 Pain in right wrist: Secondary | ICD-10-CM | POA: Diagnosis not present

## 2015-12-27 ENCOUNTER — Other Ambulatory Visit: Payer: Self-pay | Admitting: Orthopaedic Surgery

## 2015-12-27 ENCOUNTER — Ambulatory Visit (HOSPITAL_COMMUNITY)
Admission: RE | Admit: 2015-12-27 | Discharge: 2015-12-27 | Disposition: A | Discharge status: Home / Self Care | Payer: 59 | Source: Ambulatory Visit | Attending: Cardiology | Admitting: Cardiology

## 2015-12-27 DIAGNOSIS — R0602 Shortness of breath: Secondary | ICD-10-CM

## 2015-12-27 LAB — MYOCARDIAL PERFUSION IMAGING
CHL CUP MPHR: 166 {beats}/min
CHL CUP NUCLEAR SDS: 1
CHL CUP NUCLEAR SRS: 0
CHL CUP NUCLEAR SSS: 1
CSEPED: 10 min
CSEPEW: 11.4 METS
CSEPPHR: 153 {beats}/min
Exercise duration (sec): 45 s
LV dias vol: 64 mL (ref 46–106)
LVSYSVOL: 17 mL
NUC STRESS TID: 1.21
Percent HR: 92 %
RPE: 17
Rest HR: 70 {beats}/min

## 2015-12-27 MED ORDER — TECHNETIUM TC 99M TETROFOSMIN IV KIT
10.0000 | PACK | Freq: Once | INTRAVENOUS | Status: AC | PRN
  Administered 2015-12-27: 10 via INTRAVENOUS
  Filled 2015-12-27: qty 10

## 2015-12-27 MED ORDER — TECHNETIUM TC 99M TETROFOSMIN IV KIT
30.4000 | PACK | Freq: Once | INTRAVENOUS | Status: AC | PRN
  Administered 2015-12-27: 30.4 via INTRAVENOUS
  Filled 2015-12-27: qty 31

## 2015-12-31 MED FILL — BYDUREON 2 MG PEN INJECT: 2 | 90 days supply | Qty: 12 | Fill #2

## 2016-01-01 DIAGNOSIS — J3089 Other allergic rhinitis: Secondary | ICD-10-CM | POA: Diagnosis not present

## 2016-01-01 DIAGNOSIS — J301 Allergic rhinitis due to pollen: Secondary | ICD-10-CM | POA: Diagnosis not present

## 2016-01-01 NOTE — H&P (Signed)
Alice Jones is an 55 y.o. female.   Chief Complaint: right elbow pain HPI: Alice Jones persists with right elbow pain.  She has been through the MRI scan since she was last in.  She finished the antibiotics for her carpal tunnel and the incision looks much better.  She is engaged in some hand therapy for her carpal tunnel issue.  She continues with pain on the outside aspect of her elbow.  She's rested this for many weeks and it is no better.  We gave her an injection which helped for a few days but she again has pain trying to grip anything.  This is limiting to her.  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    MRI:  I reviewed an MRI scan films and report of a study done at [facility] on 12/02/15.  This shows extensive tendinopathy and considerable tearing of the ECRB.  Past Medical History:  Diagnosis Date  . Allergic rhinitis   . Carpal tunnel syndrome   . Diabetes mellitus    Dr. Chalmers Cater  . Herniated cervical disc   . Hyperlipemia   . Hypothyroid   . Narcolepsy   . Thyroid disease     Past Surgical History:  Procedure Laterality Date  . APPENDECTOMY  09/2009  . CERVICAL FUSION  12/09, 12/10   C6/7, C5/6  . REFRACTIVE SURGERY Left 09/2013  . SHOULDER SURGERY     Right    Family History  Problem Relation Age of Onset  . Cancer Mother     tongue  . Diabetes Maternal Grandfather    Social History:  reports that she has never smoked. She has never used smokeless tobacco. She reports that she drinks alcohol. She reports that she does not use drugs.  Allergies:  Allergies  Allergen Reactions  . Liraglutide     nausea    No prescriptions prior to admission.    No results found for this or any previous visit (from the past 48 hour(s)). No results found.  Review of Systems  Musculoskeletal: Positive for joint pain.       Right elbow  All other systems reviewed and are negative.   There were no vitals taken for this visit. Physical Exam   Constitutional: She is oriented to person, place, and time. She appears well-developed and well-nourished.  HENT:  Head: Normocephalic and atraumatic.  Eyes: Pupils are equal, round, and reactive to light.  Neck: Normal range of motion.  Cardiovascular: Normal rate and regular rhythm.   Respiratory: Effort normal.  GI: Soft.  Musculoskeletal:  Right elbow remains very tender over the lateral epicondyle.  She has pain to resisted extension.  Her motion is full but she does have pain towards full extension.  I don't get any pain through the radial tunnel.  Her carpal tunnel incision is healed and the redness is resolving.  Sensation is intact throughout and she has palpable pulse at the wrist.  Cervical motion does not cause any arm pain.    Neurological: She is alert and oriented to person, place, and time.  Skin: Skin is warm and dry.  Psychiatric: She has a normal mood and affect. Her behavior is normal. Judgment and thought content normal.     Assessment/Plan Assessment:  Right elbow lateral epicondylitis injected 10/21/15  Plan: Alice Jones has some terrible pain at the lateral epicondyle despite a long period of rest, injection, and oral anti-inflammatories.  She has an impressive MRI scan with some  partial tearing of the ECRB.  This has become quite limiting to her.  She is going to run this by her physical therapist who is working on her hand on the chance that they might be able to do some things for her.  I think more than likely she's going to require a surgical repair.  I reviewed risk of anesthesia and infection related this intervention and told her she will gradually get better over about 3 months.    Chaneka Trefz, Larwance Sachs, PA-C 01/01/2016, 1:35 PM

## 2016-01-08 ENCOUNTER — Encounter (HOSPITAL_BASED_OUTPATIENT_CLINIC_OR_DEPARTMENT_OTHER): Payer: Self-pay | Admitting: *Deleted

## 2016-01-10 ENCOUNTER — Encounter (HOSPITAL_BASED_OUTPATIENT_CLINIC_OR_DEPARTMENT_OTHER)
Admission: RE | Disposition: A | Discharge status: Home / Self Care | Payer: Self-pay | Source: Ambulatory Visit | Attending: Orthopaedic Surgery

## 2016-01-10 ENCOUNTER — Ambulatory Visit (HOSPITAL_BASED_OUTPATIENT_CLINIC_OR_DEPARTMENT_OTHER): Payer: 59 | Admitting: Anesthesiology

## 2016-01-10 ENCOUNTER — Encounter (HOSPITAL_BASED_OUTPATIENT_CLINIC_OR_DEPARTMENT_OTHER): Payer: Self-pay | Admitting: *Deleted

## 2016-01-10 ENCOUNTER — Ambulatory Visit (HOSPITAL_BASED_OUTPATIENT_CLINIC_OR_DEPARTMENT_OTHER)
Admission: RE | Admit: 2016-01-10 | Discharge: 2016-01-10 | Disposition: A | Discharge status: Home / Self Care | Payer: 59 | Source: Ambulatory Visit | Attending: Orthopaedic Surgery | Admitting: Orthopaedic Surgery

## 2016-01-10 ENCOUNTER — Ambulatory Visit: Payer: Self-pay | Admitting: Pharmacist

## 2016-01-10 DIAGNOSIS — E039 Hypothyroidism, unspecified: Secondary | ICD-10-CM | POA: Insufficient documentation

## 2016-01-10 DIAGNOSIS — R42 Dizziness and giddiness: Secondary | ICD-10-CM | POA: Diagnosis not present

## 2016-01-10 DIAGNOSIS — E119 Type 2 diabetes mellitus without complications: Secondary | ICD-10-CM | POA: Diagnosis not present

## 2016-01-10 DIAGNOSIS — M7711 Lateral epicondylitis, right elbow: Secondary | ICD-10-CM | POA: Diagnosis not present

## 2016-01-10 DIAGNOSIS — E785 Hyperlipidemia, unspecified: Secondary | ICD-10-CM | POA: Diagnosis not present

## 2016-01-10 DIAGNOSIS — Z79899 Other long term (current) drug therapy: Secondary | ICD-10-CM | POA: Insufficient documentation

## 2016-01-10 HISTORY — PX: TENNIS ELBOW RELEASE/NIRSCHEL PROCEDURE: SHX6651

## 2016-01-10 HISTORY — DX: Other complications of anesthesia, initial encounter: T88.59XA

## 2016-01-10 HISTORY — DX: Adverse effect of unspecified anesthetic, initial encounter: T41.45XA

## 2016-01-10 LAB — POCT I-STAT, CHEM 8
BUN: 18 mg/dL (ref 6–20)
Calcium, Ion: 1.26 mmol/L (ref 1.15–1.40)
Chloride: 103 mmol/L (ref 101–111)
Creatinine, Ser: 0.7 mg/dL (ref 0.44–1.00)
Glucose, Bld: 121 mg/dL — ABNORMAL HIGH (ref 65–99)
HCT: 40 % (ref 36.0–46.0)
Hemoglobin: 13.6 g/dL (ref 12.0–15.0)
Potassium: 4.3 mmol/L (ref 3.5–5.1)
Sodium: 139 mmol/L (ref 135–145)
TCO2: 23 mmol/L (ref 0–100)

## 2016-01-10 SURGERY — TENNIS ELBOW RELEASE/NIRSCHEL PROCEDURE
Anesthesia: General | Site: Elbow | Laterality: Right

## 2016-01-10 MED ORDER — FENTANYL CITRATE (PF) 100 MCG/2ML IJ SOLN
INTRAMUSCULAR | Status: AC
  Filled 2016-01-10: qty 2

## 2016-01-10 MED ORDER — BUPIVACAINE-EPINEPHRINE (PF) 0.25% -1:200000 IJ SOLN
INTRAMUSCULAR | Status: AC
  Filled 2016-01-10: qty 30

## 2016-01-10 MED ORDER — ONDANSETRON HCL 4 MG/2ML IJ SOLN
INTRAMUSCULAR | Status: DC | PRN
  Administered 2016-01-10: 4 mg via INTRAVENOUS

## 2016-01-10 MED ORDER — CEFAZOLIN SODIUM-DEXTROSE 2-4 GM/100ML-% IV SOLN
2.0000 g | INTRAVENOUS | Status: AC
  Administered 2016-01-10: 2 g via INTRAVENOUS

## 2016-01-10 MED ORDER — HYDROCODONE-ACETAMINOPHEN 5-325 MG PO TABS
ORAL_TABLET | ORAL | Status: AC
  Filled 2016-01-10: qty 1

## 2016-01-10 MED ORDER — MIDAZOLAM HCL 2 MG/2ML IJ SOLN
1.0000 mg | INTRAMUSCULAR | Status: DC | PRN
  Administered 2016-01-10: 2 mg via INTRAVENOUS

## 2016-01-10 MED ORDER — LIDOCAINE HCL (CARDIAC) 20 MG/ML IV SOLN
INTRAVENOUS | Status: DC | PRN
  Administered 2016-01-10: 80 mg via INTRAVENOUS

## 2016-01-10 MED ORDER — LACTATED RINGERS IV SOLN: INTRAVENOUS | Status: DC

## 2016-01-10 MED ORDER — HYDROMORPHONE HCL 1 MG/ML IJ SOLN
0.2500 mg | INTRAMUSCULAR | Status: DC | PRN
  Administered 2016-01-10 (×2): 0.5 mg via INTRAVENOUS

## 2016-01-10 MED ORDER — FENTANYL CITRATE (PF) 100 MCG/2ML IJ SOLN
50.0000 ug | INTRAMUSCULAR | Status: DC | PRN
  Administered 2016-01-10: 100 ug via INTRAVENOUS

## 2016-01-10 MED ORDER — MIDAZOLAM HCL 2 MG/2ML IJ SOLN
INTRAMUSCULAR | Status: AC
  Filled 2016-01-10: qty 2

## 2016-01-10 MED ORDER — BUPIVACAINE-EPINEPHRINE (PF) 0.25% -1:200000 IJ SOLN
INTRAMUSCULAR | Status: DC | PRN
  Administered 2016-01-10: 5 mL via PERINEURAL

## 2016-01-10 MED ORDER — SCOPOLAMINE 1 MG/3DAYS TD PT72: 1.0000 | MEDICATED_PATCH | Freq: Once | TRANSDERMAL | Status: DC | PRN

## 2016-01-10 MED ORDER — PROMETHAZINE HCL 25 MG/ML IJ SOLN: 6.2500 mg | INTRAMUSCULAR | Status: DC | PRN

## 2016-01-10 MED ORDER — CEFAZOLIN SODIUM-DEXTROSE 2-4 GM/100ML-% IV SOLN
INTRAVENOUS | Status: AC
  Filled 2016-01-10: qty 100

## 2016-01-10 MED ORDER — LACTATED RINGERS IV SOLN
INTRAVENOUS | Status: DC
  Administered 2016-01-10 (×2): via INTRAVENOUS

## 2016-01-10 MED ORDER — PROPOFOL 10 MG/ML IV BOLUS
INTRAVENOUS | Status: DC | PRN
  Administered 2016-01-10: 200 mg via INTRAVENOUS

## 2016-01-10 MED ORDER — KETOROLAC TROMETHAMINE 30 MG/ML IJ SOLN: 30.0000 mg | Freq: Once | INTRAMUSCULAR | Status: DC | PRN

## 2016-01-10 MED ORDER — HYDROCODONE-ACETAMINOPHEN 5-325 MG PO TABS: 1.0000 | ORAL_TABLET | ORAL | 0 refills | Status: DC | PRN

## 2016-01-10 MED ORDER — DEXAMETHASONE SODIUM PHOSPHATE 10 MG/ML IJ SOLN
INTRAMUSCULAR | Status: DC | PRN
  Administered 2016-01-10: 10 mg via INTRAVENOUS

## 2016-01-10 MED ORDER — CHLORHEXIDINE GLUCONATE 4 % EX LIQD: 60.0000 mL | Freq: Once | CUTANEOUS | Status: DC

## 2016-01-10 MED ORDER — HYDROMORPHONE HCL 1 MG/ML IJ SOLN
INTRAMUSCULAR | Status: AC
  Filled 2016-01-10: qty 1

## 2016-01-10 MED ORDER — HYDROCODONE-ACETAMINOPHEN 5-325 MG PO TABS
1.0000 | ORAL_TABLET | Freq: Once | ORAL | Status: AC
  Administered 2016-01-10: 1 via ORAL

## 2016-01-10 MED FILL — HYDROCODON-APAP 5-325: 5-325 | 3 days supply | Qty: 30 | Fill #0

## 2016-01-10 SURGICAL SUPPLY — 78 items
APL SKNCLS STERI-STRIP NONHPOA (GAUZE/BANDAGES/DRESSINGS)
BANDAGE ACE 3X5.8 VEL STRL LF (GAUZE/BANDAGES/DRESSINGS) ×3 IMPLANT
BANDAGE ACE 4X5 VEL STRL LF (GAUZE/BANDAGES/DRESSINGS) ×4 IMPLANT
BENZOIN TINCTURE PRP APPL 2/3 (GAUZE/BANDAGES/DRESSINGS) IMPLANT
BLADE AVERAGE 25MMX9MM (BLADE)
BLADE AVERAGE 25X9 (BLADE) IMPLANT
BLADE SURG 15 STRL LF DISP TIS (BLADE) ×3 IMPLANT
BLADE SURG 15 STRL SS (BLADE) ×8
BNDG CMPR 9X4 STRL LF SNTH (GAUZE/BANDAGES/DRESSINGS) ×2
BNDG ESMARK 4X9 LF (GAUZE/BANDAGES/DRESSINGS) ×3 IMPLANT
BNDG GAUZE ELAST 4 BULKY (GAUZE/BANDAGES/DRESSINGS) ×4 IMPLANT
CANISTER SUCT 1200ML W/VALVE (MISCELLANEOUS) IMPLANT
CLOSURE WOUND 1/2 X4 (GAUZE/BANDAGES/DRESSINGS) ×1
COVER BACK TABLE 60X90IN (DRAPES) ×4 IMPLANT
COVER MAYO STAND STRL (DRAPES) ×4 IMPLANT
CUFF TOURN SGL LL 18 NRW (TOURNIQUET CUFF) ×2 IMPLANT
CUFF TOURNIQUET SINGLE 18IN (TOURNIQUET CUFF) ×3 IMPLANT
CUFF TOURNIQUET SINGLE 24IN (TOURNIQUET CUFF) IMPLANT
DRAIN PENROSE 1/4X12 LTX STRL (WOUND CARE) IMPLANT
DRAPE EXTREMITY T 121X128X90 (DRAPE) ×4 IMPLANT
DRAPE U-SHAPE 47X51 STRL (DRAPES) ×4 IMPLANT
DRSG EMULSION OIL 3X3 NADH (GAUZE/BANDAGES/DRESSINGS) ×4 IMPLANT
DRSG PAD ABDOMINAL 8X10 ST (GAUZE/BANDAGES/DRESSINGS) ×4 IMPLANT
DURAPREP 26ML APPLICATOR (WOUND CARE) ×4 IMPLANT
ELECT REM PT RETURN 9FT ADLT (ELECTROSURGICAL) ×4
ELECTRODE REM PT RTRN 9FT ADLT (ELECTROSURGICAL) ×2 IMPLANT
GAUZE SPONGE 4X4 12PLY STRL (GAUZE/BANDAGES/DRESSINGS) ×4 IMPLANT
GAUZE SPONGE 4X4 16PLY XRAY LF (GAUZE/BANDAGES/DRESSINGS) ×4 IMPLANT
GLOVE BIO SURGEON STRL SZ 6.5 (GLOVE) ×2 IMPLANT
GLOVE BIO SURGEON STRL SZ8 (GLOVE) ×8 IMPLANT
GLOVE BIO SURGEONS STRL SZ 6.5 (GLOVE) ×1
GLOVE BIOGEL PI IND STRL 7.0 (GLOVE) ×1 IMPLANT
GLOVE BIOGEL PI IND STRL 8 (GLOVE) ×4 IMPLANT
GLOVE BIOGEL PI INDICATOR 7.0 (GLOVE) ×2
GLOVE BIOGEL PI INDICATOR 8 (GLOVE) ×4
GOWN STRL REUS W/ TWL LRG LVL3 (GOWN DISPOSABLE) ×2 IMPLANT
GOWN STRL REUS W/ TWL XL LVL3 (GOWN DISPOSABLE) ×4 IMPLANT
GOWN STRL REUS W/TWL LRG LVL3 (GOWN DISPOSABLE) ×4
GOWN STRL REUS W/TWL XL LVL3 (GOWN DISPOSABLE) ×8
LOOP VESSEL MAXI BLUE (MISCELLANEOUS) IMPLANT
NDL ADDISON D1/2 CIR (NEEDLE) IMPLANT
NDL HYPO 25X1 1.5 SAFETY (NEEDLE) IMPLANT
NEEDLE ADDISON D1/2 CIR (NEEDLE) IMPLANT
NEEDLE HYPO 25X1 1.5 SAFETY (NEEDLE) ×4 IMPLANT
NS IRRIG 1000ML POUR BTL (IV SOLUTION) ×4 IMPLANT
PACK BASIN DAY SURGERY FS (CUSTOM PROCEDURE TRAY) ×4 IMPLANT
PAD CAST 4YDX4 CTTN HI CHSV (CAST SUPPLIES) IMPLANT
PADDING CAST ABS 4INX4YD NS (CAST SUPPLIES) ×2
PADDING CAST ABS COTTON 4X4 ST (CAST SUPPLIES) ×2 IMPLANT
PADDING CAST COTTON 4X4 STRL (CAST SUPPLIES)
PENCIL BUTTON HOLSTER BLD 10FT (ELECTRODE) ×4 IMPLANT
SLEEVE SCD COMPRESS KNEE MED (MISCELLANEOUS) ×3 IMPLANT
SLING ARM FOAM STRAP LRG (SOFTGOODS) IMPLANT
SLING ARM MED ADULT FOAM STRAP (SOFTGOODS) IMPLANT
SLING ARM XL FOAM STRAP (SOFTGOODS) IMPLANT
SPLINT FAST PLASTER 5X30 (CAST SUPPLIES)
SPLINT PLASTER CAST FAST 5X30 (CAST SUPPLIES) IMPLANT
STOCKINETTE 4X48 STRL (DRAPES) ×4 IMPLANT
STRIP CLOSURE SKIN 1/2X4 (GAUZE/BANDAGES/DRESSINGS) ×2 IMPLANT
SUCTION FRAZIER HANDLE 10FR (MISCELLANEOUS) ×2
SUCTION TUBE FRAZIER 10FR DISP (MISCELLANEOUS) ×1 IMPLANT
SUT BONE WAX W31G (SUTURE) IMPLANT
SUT ETHILON 4 0 PS 2 18 (SUTURE) ×1 IMPLANT
SUT MNCRL AB 4-0 PS2 18 (SUTURE) ×4 IMPLANT
SUT PROLENE 4 0 PS 2 18 (SUTURE) IMPLANT
SUT VIC AB 0 CT3 27 (SUTURE) IMPLANT
SUT VIC AB 0 SH 27 (SUTURE) ×3 IMPLANT
SUT VIC AB 1 CT1 27 (SUTURE)
SUT VIC AB 1 CT1 27XBRD ANBCTR (SUTURE) IMPLANT
SUT VIC AB 2-0 SH 27 (SUTURE) ×4
SUT VIC AB 2-0 SH 27XBRD (SUTURE) ×2 IMPLANT
SYR BULB 3OZ (MISCELLANEOUS) ×4 IMPLANT
SYR CONTROL 10ML LL (SYRINGE) ×3 IMPLANT
TOWEL OR 17X24 6PK STRL BLUE (TOWEL DISPOSABLE) ×4 IMPLANT
TOWEL OR NON WOVEN STRL DISP B (DISPOSABLE) ×4 IMPLANT
TUBE CONNECTING 20'X1/4 (TUBING)
TUBE CONNECTING 20X1/4 (TUBING) IMPLANT
UNDERPAD 30X30 (UNDERPADS AND DIAPERS) ×4 IMPLANT

## 2016-01-10 NOTE — Discharge Instructions (Signed)

## 2016-01-10 NOTE — Transfer of Care (Signed)
Immediate Anesthesia Transfer of Care Note  Patient: Alice Jones  Procedure(s) Performed: Procedure(s): Right Elbow Repair (Right)  Patient Location: PACU  Anesthesia Type:General  Level of Consciousness: awake, alert  and oriented  Airway & Oxygen Therapy: Patient Spontanous Breathing and Patient connected to face mask oxygen  Post-op Assessment: Report given to RN and Post -op Vital signs reviewed and stable  Post vital signs: Reviewed and stable  Last Vitals:  Vitals:   01/10/16 0949  BP: 126/66  Pulse: 72  Resp: 18  Temp: 36.6 C    Last Pain:  Vitals:   01/10/16 0949  TempSrc: Oral  PainSc: 2       Patients Stated Pain Goal: 1 ([id number])  Complications: No apparent anesthesia complications

## 2016-01-10 NOTE — Op Note (Signed)
#  [id number] 

## 2016-01-10 NOTE — Anesthesia Procedure Notes (Signed)
Procedure Name: LMA Insertion Date/Time: 01/10/2016 12:14 PM Performed by: Melynda Ripple D Pre-anesthesia Checklist: Patient identified, Emergency Drugs available, Suction available and Patient being monitored Patient Re-evaluated:Patient Re-evaluated prior to inductionOxygen Delivery Method: Circle system utilized Preoxygenation: Pre-oxygenation with 100% oxygen Intubation Type: IV induction Ventilation: Mask ventilation without difficulty LMA: LMA inserted LMA Size: 4.0 Number of attempts: 1 Airway Equipment and Method: Bite block Placement Confirmation: positive ETCO2 Tube secured with: Tape Dental Injury: Teeth and Oropharynx as per pre-operative assessment

## 2016-01-10 NOTE — Interval H&P Note (Signed)
History and Physical Interval Note:  01/10/2016 11:48 AM  Alice Jones  has presented today for surgery, with the diagnosis of RIGHT ELBOW LATERAL EPICONDYLITIS  The various methods of treatment have been discussed with the patient and family. After consideration of risks, benefits and other options for treatment, the patient has consented to  Procedure(s): ARTHROSCOPY ELBOW/REPAIR (Right) as a surgical intervention .  The patient's history has been reviewed, patient examined, no change in status, stable for surgery.  I have reviewed the patient's chart and labs.  Questions were answered to the patient's satisfaction.     Jaan Fischel G

## 2016-01-10 NOTE — Anesthesia Preprocedure Evaluation (Signed)
Anesthesia Evaluation  Patient identified by MRN, date of birth, ID band Patient awake    Reviewed: Allergy & Precautions, NPO status , Patient's Chart, lab work & pertinent test results  Airway Mallampati: II  TM Distance: >3 FB Neck ROM: Full    Dental no notable dental hx.    Pulmonary neg pulmonary ROS,    Pulmonary exam normal breath sounds clear to auscultation       Cardiovascular negative cardio ROS Normal cardiovascular exam Rhythm:Regular Rate:Normal     Neuro/Psych negative neurological ROS  negative psych ROS   GI/Hepatic negative GI ROS, Neg liver ROS,   Endo/Other  diabetesHypothyroidism   Renal/GU negative Renal ROS  negative genitourinary   Musculoskeletal negative musculoskeletal ROS (+)   Abdominal   Peds negative pediatric ROS (+)  Hematology negative hematology ROS (+)   Anesthesia Other Findings   Reproductive/Obstetrics negative OB ROS                             Anesthesia Physical Anesthesia Plan  ASA: II  Anesthesia Plan: General   Post-op Pain Management:    Induction: Intravenous  Airway Management Planned: LMA and Oral ETT  Additional Equipment:   Intra-op Plan:   Post-operative Plan: Extubation in OR  Informed Consent: I have reviewed the patients History and Physical, chart, labs and discussed the procedure including the risks, benefits and alternatives for the proposed anesthesia with the patient or authorized representative who has indicated his/her understanding and acceptance.   Dental advisory given  Plan Discussed with: CRNA and Surgeon  Anesthesia Plan Comments:         Anesthesia Quick Evaluation

## 2016-01-10 NOTE — Anesthesia Postprocedure Evaluation (Signed)
Anesthesia Post Note  Patient: MALAREE BUZBY  Procedure(s) Performed: Procedure(s) (LRB): Right Elbow Repair (Right)  Patient location during evaluation: PACU Anesthesia Type: General Level of consciousness: awake and alert Pain management: pain level controlled Vital Signs Assessment: post-procedure vital signs reviewed and stable Respiratory status: spontaneous breathing, nonlabored ventilation, respiratory function stable and patient connected to nasal cannula oxygen Cardiovascular status: blood pressure returned to baseline and stable Postop Assessment: no signs of nausea or vomiting Anesthetic complications: no       Last Vitals:  Vitals:   01/10/16 1300 01/10/16 1301  BP:    Pulse: 95 92  Resp: 18 15  Temp: 36.6 C     Last Pain:  Vitals:   01/10/16 1315  TempSrc:   PainSc: 5                  Konnar Ben S

## 2016-01-14 ENCOUNTER — Encounter (HOSPITAL_BASED_OUTPATIENT_CLINIC_OR_DEPARTMENT_OTHER): Payer: Self-pay | Admitting: Orthopaedic Surgery

## 2016-01-14 NOTE — Op Note (Signed)
NAME:  Alice Jones, Alice Jones                 ACCOUNT NO.:  MEDICAL RECORD NO.:  [id number]  LOCATION:                                 FACILITY:  PHYSICIAN:  Monico Blitz. Rhona Raider, M.D.     DATE OF BIRTH:  DATE OF PROCEDURE:  01/10/2016 DATE OF DISCHARGE:                              OPERATIVE REPORT   PREOPERATIVE DIAGNOSIS:  Right elbow lateral epicondylitis.  POSTOPERATIVE DIAGNOSIS:  Right elbow lateral epicondylitis.  PROCEDURE:  Right elbow lateral repair.  ANESTHESIA:  General.  ATTENDING SURGEON:  Monico Blitz. Rhona Raider, M.D.  ASSISTANT:  Loni Dolly, PA.  INDICATION FOR PROCEDURE:  The patient is a 55 year old woman, who was struggled for many months of pain on the lateral aspect of her elbow. This is persisted despite bracing, injection, pills, and therapy.  By MRI scan, she has some partial tearing of the extensor origin.  She has pain which limits ability to use her arm, and she is offered operative intervention.  Informed operative consent was obtained after discussion of possible complications including reaction to anesthesia, infection, and neurovascular injury.  SUMMARY OF FINDINGS AND PROCEDURE:  Under general anesthesia, through a small lateral incision, dissection was carried down to the extensor origin.  A longitudinal split was made here and some devitalized tissue was removed.  I then debrided the bone.  She was repaired and placed into a soft dressing and discharged home.  DESCRIPTION OF PROCEDURE:  The patient was taken to the operating suite, where general anesthetic was applied without difficulty.  She was positioned supine and prepped and draped in normal sterile fashion. After the administration of IV Kefzol and an appropriate time out, the right arm was elevated, exsanguinated, and a tourniquet inflated about the upper arm.  I made a small lateral incision above the lateral epicondyle.  Dissection was carried down to the extensor origin.  I made a  longitudinal split in this structure and removed some devitalized tissue from what I felt was the ECRB origin.  I then debrided the bone with a rongeur.  We irrigated, followed by reapproximation of the extensor origin split longitudinally with #1 Vicryl in interrupted fashion.  The wound was again irrigated, followed by release of tourniquet.  We injected some Marcaine with epinephrine about the incision site.  I then reapproximated subcutaneous tissues with 2-0 undyed Vicryl and skin with a subcuticular stitch and Steri-Strips. Adaptic was applied, followed by dry gauze and loose Ace wrap. Estimated blood loss and fluids can be obtained from anesthesia records as can accurate tourniquet time.  DISPOSITION:  The patient was extubated in the operating room and taken to recovery room in stable condition.  She was to go home same-day and follow up in the office in less than a week.  I will contact her by phone tonight.     Monico Blitz Rhona Raider, M.D.     PGD/MEDQ  D:  01/10/2016  T:  01/10/2016  Job:  [id number]

## 2016-01-22 MED FILL — METFORMIN HCL ER 500 MG TAB: 500 | 90 days supply | Qty: 360 | Fill #2

## 2016-01-22 MED FILL — ARMODAFINIL 250 MG TABLET: 250 | 30 days supply | Qty: 30 | Fill #3

## 2016-01-23 MED FILL — DYMISTA NASAL SPRAY: 137-50 | 30 days supply | Qty: 23 | Fill #1

## 2016-01-24 DIAGNOSIS — Z9889 Other specified postprocedural states: Secondary | ICD-10-CM | POA: Diagnosis not present

## 2016-01-29 DIAGNOSIS — J301 Allergic rhinitis due to pollen: Secondary | ICD-10-CM | POA: Diagnosis not present

## 2016-01-29 DIAGNOSIS — J3089 Other allergic rhinitis: Secondary | ICD-10-CM | POA: Diagnosis not present

## 2016-02-12 MED FILL — LEVOTHYROXINE 137 MCG TAB: 137 | 90 days supply | Qty: 90 | Fill #3

## 2016-02-14 DIAGNOSIS — J3089 Other allergic rhinitis: Secondary | ICD-10-CM | POA: Diagnosis not present

## 2016-02-14 DIAGNOSIS — J301 Allergic rhinitis due to pollen: Secondary | ICD-10-CM | POA: Diagnosis not present

## 2016-02-19 MED FILL — ARMODAFINIL 250 MG TABLET: 250 | 30 days supply | Qty: 30 | Fill #4

## 2016-02-28 DIAGNOSIS — G5603 Carpal tunnel syndrome, bilateral upper limbs: Secondary | ICD-10-CM | POA: Diagnosis not present

## 2016-02-28 DIAGNOSIS — J301 Allergic rhinitis due to pollen: Secondary | ICD-10-CM | POA: Diagnosis not present

## 2016-02-28 DIAGNOSIS — J3089 Other allergic rhinitis: Secondary | ICD-10-CM | POA: Diagnosis not present

## 2016-02-28 DIAGNOSIS — Z9889 Other specified postprocedural states: Secondary | ICD-10-CM | POA: Diagnosis not present

## 2016-02-28 MED FILL — EPINEPHRINE 0.3 MG AUTO-INJ: 0.3 | 30 days supply | Qty: 2 | Fill #1

## 2016-03-13 DIAGNOSIS — Z9889 Other specified postprocedural states: Secondary | ICD-10-CM | POA: Diagnosis not present

## 2016-03-23 MED FILL — ARMODAFINIL 250 MG TABLET: 250 | 30 days supply | Qty: 30 | Fill #5

## 2016-03-23 MED FILL — BYDUREON 2 MG PEN INJECT: 2 | 90 days supply | Qty: 12 | Fill #3

## 2016-03-27 DIAGNOSIS — J3089 Other allergic rhinitis: Secondary | ICD-10-CM | POA: Diagnosis not present

## 2016-03-27 DIAGNOSIS — J301 Allergic rhinitis due to pollen: Secondary | ICD-10-CM | POA: Diagnosis not present

## 2016-04-15 DIAGNOSIS — J3089 Other allergic rhinitis: Secondary | ICD-10-CM | POA: Diagnosis not present

## 2016-04-15 DIAGNOSIS — J301 Allergic rhinitis due to pollen: Secondary | ICD-10-CM | POA: Diagnosis not present

## 2016-04-20 DIAGNOSIS — J301 Allergic rhinitis due to pollen: Secondary | ICD-10-CM | POA: Diagnosis not present

## 2016-04-20 DIAGNOSIS — J3089 Other allergic rhinitis: Secondary | ICD-10-CM | POA: Diagnosis not present

## 2016-04-20 MED FILL — ARMODAFINIL 250 MG TABLET: 250 | 30 days supply | Qty: 30 | Fill #0

## 2016-04-20 MED FILL — METFORMIN HCL ER 500 MG TAB: 500 | 90 days supply | Qty: 360 | Fill #3

## 2016-05-01 DIAGNOSIS — E039 Hypothyroidism, unspecified: Secondary | ICD-10-CM | POA: Diagnosis not present

## 2016-05-01 DIAGNOSIS — E1165 Type 2 diabetes mellitus with hyperglycemia: Secondary | ICD-10-CM | POA: Diagnosis not present

## 2016-05-01 DIAGNOSIS — E7800 Pure hypercholesterolemia, unspecified: Secondary | ICD-10-CM | POA: Diagnosis not present

## 2016-05-08 DIAGNOSIS — J3089 Other allergic rhinitis: Secondary | ICD-10-CM | POA: Diagnosis not present

## 2016-05-08 DIAGNOSIS — J301 Allergic rhinitis due to pollen: Secondary | ICD-10-CM | POA: Diagnosis not present

## 2016-05-13 MED FILL — LEVOTHYROXINE 137 MCG TAB: 137 | 90 days supply | Qty: 90 | Fill #4

## 2016-05-14 MED FILL — ACCU-CHEK FASTCLIX LANCETS: 30 days supply | Qty: 204 | Fill #0

## 2016-05-14 MED FILL — ACCU-CHEK GUIDE TEST STRIP: 30 days supply | Qty: 200 | Fill #0

## 2016-05-15 DIAGNOSIS — J3089 Other allergic rhinitis: Secondary | ICD-10-CM | POA: Diagnosis not present

## 2016-05-15 DIAGNOSIS — J301 Allergic rhinitis due to pollen: Secondary | ICD-10-CM | POA: Diagnosis not present

## 2016-05-20 DIAGNOSIS — J301 Allergic rhinitis due to pollen: Secondary | ICD-10-CM | POA: Diagnosis not present

## 2016-05-20 DIAGNOSIS — J3089 Other allergic rhinitis: Secondary | ICD-10-CM | POA: Diagnosis not present

## 2016-05-21 MED FILL — ARMODAFINIL 250 MG TABLET: 250 | 30 days supply | Qty: 30 | Fill #1

## 2016-05-29 DIAGNOSIS — J3081 Allergic rhinitis due to animal (cat) (dog) hair and dander: Secondary | ICD-10-CM | POA: Diagnosis not present

## 2016-05-29 DIAGNOSIS — J301 Allergic rhinitis due to pollen: Secondary | ICD-10-CM | POA: Diagnosis not present

## 2016-05-29 DIAGNOSIS — J3089 Other allergic rhinitis: Secondary | ICD-10-CM | POA: Diagnosis not present

## 2016-06-10 DIAGNOSIS — J301 Allergic rhinitis due to pollen: Secondary | ICD-10-CM | POA: Diagnosis not present

## 2016-06-10 DIAGNOSIS — J3089 Other allergic rhinitis: Secondary | ICD-10-CM | POA: Diagnosis not present

## 2016-06-11 DIAGNOSIS — I10 Essential (primary) hypertension: Secondary | ICD-10-CM | POA: Diagnosis not present

## 2016-06-11 DIAGNOSIS — E7800 Pure hypercholesterolemia, unspecified: Secondary | ICD-10-CM | POA: Diagnosis not present

## 2016-06-11 DIAGNOSIS — E1165 Type 2 diabetes mellitus with hyperglycemia: Secondary | ICD-10-CM | POA: Diagnosis not present

## 2016-06-11 DIAGNOSIS — E039 Hypothyroidism, unspecified: Secondary | ICD-10-CM | POA: Diagnosis not present

## 2016-06-16 ENCOUNTER — Other Ambulatory Visit: Payer: Self-pay

## 2016-06-16 MED ORDER — ARMODAFINIL 250 MG PO TABS: ORAL_TABLET | ORAL | 5 refills | Status: DC

## 2016-06-16 MED FILL — BYDUREON 2 MG PEN INJECT: 2 | 90 days supply | Qty: 12 | Fill #4

## 2016-06-17 NOTE — Telephone Encounter (Signed)
RX for armodafinil faxed to [facility]. Received a receipt of confirmation.

## 2016-06-18 DIAGNOSIS — J301 Allergic rhinitis due to pollen: Secondary | ICD-10-CM | POA: Diagnosis not present

## 2016-06-18 DIAGNOSIS — J3089 Other allergic rhinitis: Secondary | ICD-10-CM | POA: Diagnosis not present

## 2016-06-18 DIAGNOSIS — J3081 Allergic rhinitis due to animal (cat) (dog) hair and dander: Secondary | ICD-10-CM | POA: Diagnosis not present

## 2016-06-18 MED FILL — ARMODAFINIL 250 MG TABLET: 250 | 30 days supply | Qty: 30 | Fill #0

## 2016-06-22 MED FILL — LEVOTHYROXINE 150 MCG TAB: 150 | 30 days supply | Qty: 30 | Fill #0

## 2016-06-22 MED FILL — DYMISTA NASAL SPRAY: 137-50 | 30 days supply | Qty: 23 | Fill #0

## 2016-06-24 DIAGNOSIS — J3089 Other allergic rhinitis: Secondary | ICD-10-CM | POA: Diagnosis not present

## 2016-06-24 DIAGNOSIS — J3081 Allergic rhinitis due to animal (cat) (dog) hair and dander: Secondary | ICD-10-CM | POA: Diagnosis not present

## 2016-06-24 DIAGNOSIS — J301 Allergic rhinitis due to pollen: Secondary | ICD-10-CM | POA: Diagnosis not present

## 2016-07-17 DIAGNOSIS — J3089 Other allergic rhinitis: Secondary | ICD-10-CM | POA: Diagnosis not present

## 2016-07-17 DIAGNOSIS — J301 Allergic rhinitis due to pollen: Secondary | ICD-10-CM | POA: Diagnosis not present

## 2016-07-17 MED FILL — ARMODAFINIL 250 MG TABLET: 250 | 30 days supply | Qty: 30 | Fill #1

## 2016-07-17 MED FILL — METFORMIN HCL ER 500 MG TAB: 500 | 90 days supply | Qty: 360 | Fill #0

## 2016-07-17 MED FILL — LEVOTHYROXINE 150 MCG TAB: 150 | 30 days supply | Qty: 30 | Fill #1

## 2016-07-30 DIAGNOSIS — J301 Allergic rhinitis due to pollen: Secondary | ICD-10-CM | POA: Diagnosis not present

## 2016-07-30 DIAGNOSIS — J3089 Other allergic rhinitis: Secondary | ICD-10-CM | POA: Diagnosis not present

## 2016-08-07 DIAGNOSIS — J3089 Other allergic rhinitis: Secondary | ICD-10-CM | POA: Diagnosis not present

## 2016-08-07 DIAGNOSIS — R5383 Other fatigue: Secondary | ICD-10-CM | POA: Diagnosis not present

## 2016-08-07 DIAGNOSIS — J301 Allergic rhinitis due to pollen: Secondary | ICD-10-CM | POA: Diagnosis not present

## 2016-08-07 DIAGNOSIS — E039 Hypothyroidism, unspecified: Secondary | ICD-10-CM | POA: Diagnosis not present

## 2016-08-10 DIAGNOSIS — J301 Allergic rhinitis due to pollen: Secondary | ICD-10-CM | POA: Diagnosis not present

## 2016-08-11 DIAGNOSIS — J3089 Other allergic rhinitis: Secondary | ICD-10-CM | POA: Diagnosis not present

## 2016-08-13 DIAGNOSIS — J301 Allergic rhinitis due to pollen: Secondary | ICD-10-CM | POA: Diagnosis not present

## 2016-08-13 DIAGNOSIS — J3089 Other allergic rhinitis: Secondary | ICD-10-CM | POA: Diagnosis not present

## 2016-08-20 MED FILL — LEVOTHYROXINE 150 MCG TAB: 150 | 30 days supply | Qty: 30 | Fill #2

## 2016-08-20 MED FILL — ARMODAFINIL 250 MG TABLET: 250 | 30 days supply | Qty: 30 | Fill #2

## 2016-09-03 DIAGNOSIS — J3089 Other allergic rhinitis: Secondary | ICD-10-CM | POA: Diagnosis not present

## 2016-09-03 DIAGNOSIS — J301 Allergic rhinitis due to pollen: Secondary | ICD-10-CM | POA: Diagnosis not present

## 2016-09-09 MED FILL — BYDUREON 2 MG PEN INJECT: 2 | 84 days supply | Qty: 12 | Fill #0

## 2016-09-18 DIAGNOSIS — J301 Allergic rhinitis due to pollen: Secondary | ICD-10-CM | POA: Diagnosis not present

## 2016-09-18 DIAGNOSIS — J3089 Other allergic rhinitis: Secondary | ICD-10-CM | POA: Diagnosis not present

## 2016-09-18 MED FILL — LEVOTHYROXINE 150 MCG TAB: 150 | 30 days supply | Qty: 30 | Fill #3

## 2016-09-18 MED FILL — ARMODAFINIL 250 MG TABLET: 250 | 30 days supply | Qty: 30 | Fill #3

## 2016-09-29 DIAGNOSIS — J301 Allergic rhinitis due to pollen: Secondary | ICD-10-CM | POA: Diagnosis not present

## 2016-09-29 DIAGNOSIS — J3089 Other allergic rhinitis: Secondary | ICD-10-CM | POA: Diagnosis not present

## 2016-10-07 ENCOUNTER — Ambulatory Visit (INDEPENDENT_AMBULATORY_CARE_PROVIDER_SITE_OTHER): Payer: 59 | Admitting: Family Medicine

## 2016-10-07 ENCOUNTER — Encounter: Payer: Self-pay | Admitting: Family Medicine

## 2016-10-07 VITALS — BP 137/84 | HR 87 | Temp 99.0°F | Resp 16 | Ht 61.81 in | Wt 172.0 lb

## 2016-10-07 DIAGNOSIS — R112 Nausea with vomiting, unspecified: Secondary | ICD-10-CM

## 2016-10-07 DIAGNOSIS — E119 Type 2 diabetes mellitus without complications: Secondary | ICD-10-CM | POA: Diagnosis not present

## 2016-10-07 DIAGNOSIS — Z1231 Encounter for screening mammogram for malignant neoplasm of breast: Secondary | ICD-10-CM

## 2016-10-07 DIAGNOSIS — R1011 Right upper quadrant pain: Secondary | ICD-10-CM

## 2016-10-07 DIAGNOSIS — Z1239 Encounter for other screening for malignant neoplasm of breast: Secondary | ICD-10-CM

## 2016-10-07 LAB — POCT CBC
Granulocyte percent: 66.2 %G (ref 37–80)
HCT, POC: 41.5 % (ref 37.7–47.9)
HEMOGLOBIN: 13.9 g/dL (ref 12.2–16.2)
LYMPH, POC: 3.2 (ref 0.6–3.4)
MCH: 30.7 pg (ref 27–31.2)
MCHC: 33.4 g/dL (ref 31.8–35.4)
MCV: 92 fL (ref 80–97)
MID (cbc): 0.7 (ref 0–0.9)
MPV: 6.5 fL (ref 0–99.8)
PLATELET COUNT, POC: 407 10*3/uL (ref 142–424)
POC Granulocyte: 7.6 — AB (ref 2–6.9)
POC LYMPH PERCENT: 28 %L (ref 10–50)
POC MID %: 5.8 %M (ref 0–12)
RBC: 4.51 M/uL (ref 4.04–5.48)
RDW, POC: 13.1 %
WBC: 11.5 10*3/uL — AB (ref 4.6–10.2)

## 2016-10-07 LAB — POCT URINALYSIS DIP (MANUAL ENTRY)
Bilirubin, UA: NEGATIVE
Glucose, UA: NEGATIVE mg/dL
Ketones, POC UA: NEGATIVE mg/dL
LEUKOCYTES UA: NEGATIVE
NITRITE UA: NEGATIVE
PH UA: 5.5 (ref 5.0–8.0)
PROTEIN UA: NEGATIVE mg/dL
Spec Grav, UA: 1.01 (ref 1.010–1.025)
UROBILINOGEN UA: 0.2 U/dL

## 2016-10-07 MED ORDER — ONDANSETRON 8 MG PO TBDP: 8.0000 mg | ORAL_TABLET | Freq: Three times a day (TID) | ORAL | 0 refills | Status: DC | PRN

## 2016-10-07 MED FILL — ONDANSETRON ODT 8 MG TABLET: 8 | 7 days supply | Qty: 20 | Fill #0

## 2016-10-07 NOTE — Patient Instructions (Addendum)
   IF you received an x-ray today, you will receive an invoice from [facility]. Please contact  Radiology at [phone] with questions or concerns regarding your invoice.   IF you received labwork today, you will receive an invoice from [facility]. Please contact [facility] at [phone] with questions or concerns regarding your invoice.   Our billing staff will not be able to assist you with questions regarding bills from these companies.  You will be contacted with the lab results as soon as they are available. The fastest way to get your results is to activate your My Chart account. Instructions are located on the last page of this paperwork. If you have not heard from us regarding the results in 2 weeks, please contact this office.      Nausea, Adult Nausea is the feeling of an upset stomach or having to vomit. Nausea on its own is not usually a serious concern, but it may be an early sign of a more serious medical problem. As nausea gets worse, it can lead to vomiting. If vomiting develops, or if you are not able to drink enough fluids, you are at risk of becoming dehydrated. Dehydration can make you tired and thirsty, cause you to have a dry mouth, and decrease how often you urinate. Older adults and people with other diseases or a weak immune system are at higher risk for dehydration. The main goals of treating your nausea are:  To limit repeated nausea episodes.  To prevent vomiting and dehydration.  Follow these instructions at home: Follow instructions from your health care provider about how to care for yourself at home. Eating and drinking Follow these recommendations as told by your health care provider:  Take an oral rehydration solution (ORS). This is a drink that is sold at pharmacies and retail stores.  Drink clear fluids in small amounts as you are able. Clear fluids include water, ice chips, diluted fruit juice, and low-calorie sports  drinks.  Eat bland, easy-to-digest foods in small amounts as you are able. These foods include bananas, applesauce, rice, lean meats, toast, and crackers.  Avoid drinking fluids that contain a lot of sugar or caffeine, such as energy drinks, sports drinks, and soda.  Avoid alcohol.  Avoid spicy or fatty foods.  General instructions  Drink enough fluid to keep your urine clear or pale yellow.  Wash your hands often. If soap and water are not available, use hand sanitizer.  Make sure that all people in your household wash their hands well and often.  Rest at home while you recover.  Take over-the-counter and prescription medicines only as told by your health care provider.  Breathe slowly and deeply when you feel nauseous.  Watch your condition for any changes.  Keep all follow-up visits as told by your health care provider. This is important. Contact a health care provider if:  You have a headache.  You have new symptoms.  Your nausea gets worse.  You have a fever.  You feel light-headed or dizzy.  You vomit.  You cannot keep fluids down. Get help right away if:  You have pain in your chest, neck, arm, or jaw.  You feel extremely weak or you faint.  You have vomit that is bright red or looks like coffee grounds.  You have bloody or black stools or stools that look like tar.  You have a severe headache, a stiff neck, or both.  You have severe pain, cramping, or bloating in your abdomen.    You have a rash.  You have difficulty breathing or are breathing very quickly.  Your heart is beating very quickly.  Your skin feels cold and clammy.  You feel confused.  You have pain when you urinate.  You have signs of dehydration, such as: ? Dark urine, very little, or no urine. ? Cracked lips. ? Dry mouth. ? Sunken eyes. ? Sleepiness. ? Weakness. These symptoms may represent a serious problem that is an emergency. Do not wait to see if the symptoms will go  away. Get medical help right away. Call your local emergency services (911 in the [place] Do not drive yourself to the hospital. This information is not intended to replace advice given to you by your health care provider. Make sure you discuss any questions you have with your health care provider. Document Released: 02/13/2004 Document Revised: 06/10/2015 Document Reviewed: 09/11/2014 Elsevier Interactive Patient Education  2017 Elsevier Inc.  

## 2016-10-07 NOTE — Progress Notes (Signed)
Subjective:    Patient ID: Alice Jones, female    DOB: 1960/03/10, 56 y.o.   MRN: [id number]  10/07/2016  Abdominal Pain (since Monday pain is more on the left side ) and Nausea (with vomitting since Monday )   HPI This 56 y.o. female presents for evaluation of nausea and vomiting.  Onset three days ago.   Horribly persistent nausea.  Woke up this morning with extreme pain. Now concentratng in midsection; goes in waves.  For several months, will have intermittent episodes of nausea with vomiting.  Takes a lot of medication; bydureon can cause nuasea.  Totally intolreant to Victoza.  One year of bydureon.  Stools have also been really loose lately.  Recommended Metformin holiday but did not do that.  Protein shake instead of yogurt has helped loose stools.   Explosive stools some solid.  Just had colonoscopy 2017 by Dr. Collene Mares.  Has never really recovered from that.  2-3 stools per day. Taking Nexium since startng bydureon.   No melena or bloody stools.  Last vomit two days ago.   No diverticulosis on colonoscopy in 2017.  +polyps on colonoscopy.    BP Readings from Last 3 Encounters:  10/10/16 (!) 151/85  10/07/16 137/84  01/10/16 137/88   Wt Readings from Last 3 Encounters:  10/10/16 171 lb (77.6 kg)  10/07/16 172 lb (78 kg)  01/10/16 165 lb (74.8 kg)    There is no immunization history on file for this patient.  Review of Systems  Constitutional: Negative for chills, diaphoresis, fatigue and fever.  Eyes: Negative for visual disturbance.  Respiratory: Negative for cough and shortness of breath.   Cardiovascular: Negative for chest pain, palpitations and leg swelling.  Gastrointestinal: Positive for abdominal pain, diarrhea, nausea and vomiting. Negative for abdominal distention, anal bleeding, blood in stool, constipation and rectal pain.  Endocrine: Negative for cold intolerance, heat intolerance, polydipsia, polyphagia and polyuria.  Neurological: Negative for  dizziness, tremors, seizures, syncope, facial asymmetry, speech difficulty, weakness, light-headedness, numbness and headaches.    Past Medical History:  Diagnosis Date  . Allergic rhinitis   . Carpal tunnel syndrome   . Complication of anesthesia    pt states difficulty with achieving adequate sedation. ? due to narcolepsy med  . Diabetes mellitus    Dr. Chalmers Cater  . Herniated cervical disc   . Hyperlipemia   . Hypothyroid   . Narcolepsy   . Thyroid disease    Past Surgical History:  Procedure Laterality Date  . APPENDECTOMY  09/2009  . BREAST BIOPSY Right   . CARPAL TUNNEL RELEASE Right 11/03/2015  . CERVICAL FUSION  12/09, 12/10   C6/7, C5/6  . MM BREAST STEREO BX*R*R/S Right   . REDUCTION MAMMAPLASTY    . REFRACTIVE SURGERY Left 09/2013  . SHOULDER SURGERY     Right  . TENNIS ELBOW RELEASE/NIRSCHEL PROCEDURE Right 01/10/2016   Procedure: Right Elbow Repair;  Surgeon: Melrose Nakayama, MD;  Location: [facility];  Service: Orthopedics;  Laterality: Right;   Allergies  Allergen Reactions  . Liraglutide     nausea    Social History   Social History  . Marital status: Married    Spouse name: [name]  . Number of children: 1  . Years of education: law degree   Occupational History  . lawyer    Social History Main Topics  . Smoking status: Never Smoker  . Smokeless tobacco: Never Used  . Alcohol use 0.0 oz/week  Comment: 2-3 beers weekly  . Drug use: No  . Sexual activity: Yes    Partners: Female   Other Topics Concern  . Not on file   Social History Narrative   Patient is married ([name]) and lives at home with her wife.   Patient has one child.   Patient works full-time.   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   Patient is right-handed.   Patient drinks 5-6 cups of tea before 5 pm.    Family History  Problem Relation Age of Onset  . Cancer Mother        tongue  . Diabetes Maternal Grandfather        Objective:    BP  137/84   Pulse 87   Temp 99 F (37.2 C) (Oral)   Resp 16   Ht 5' 1.81" (1.57 m)   Wt 172 lb (78 kg)   LMP 09/23/2016 (Approximate)   SpO2 98%   BMI 31.65 kg/m  Physical Exam  Constitutional: She is oriented to person, place, and time. She appears well-developed and well-nourished. No distress.  HENT:  Head: Normocephalic and atraumatic.  Right Ear: External ear normal.  Left Ear: External ear normal.  Nose: Nose normal.  Mouth/Throat: Oropharynx is clear and moist.  Eyes: Pupils are equal, round, and reactive to light. Conjunctivae and EOM are normal.  Neck: Normal range of motion. Neck supple. Carotid bruit is not present. No thyromegaly present.  Cardiovascular: Normal rate, regular rhythm, normal heart sounds and intact distal pulses.  Exam reveals no gallop and no friction rub.   No murmur heard. Pulmonary/Chest: Effort normal and breath sounds normal. She has no wheezes. She has no rales.  Abdominal: Soft. Bowel sounds are normal. She exhibits no distension and no mass. There is no hepatosplenomegaly. There is tenderness in the right upper quadrant and epigastric area. There is no rebound, no guarding and no CVA tenderness. No hernia.  Lymphadenopathy:    She has no cervical adenopathy.  Neurological: She is alert and oriented to person, place, and time. No cranial nerve deficit.  Skin: Skin is warm and dry. No rash noted. She is not diaphoretic. No erythema. No pallor.  Psychiatric: She has a normal mood and affect. Her behavior is normal.    No results found. Depression screen PHQ 2/9 10/07/2016  Decreased Interest 0  Down, Depressed, Hopeless 0  PHQ - 2 Score 0   Fall Risk  10/07/2016 06/01/2014  Falls in the past year? No No        Assessment & Plan:   1. Right upper quadrant abdominal pain   2. Non-intractable vomiting with nausea, unspecified vomiting type   3. Breast cancer screening   4. Type 2 diabetes mellitus without complication, without long-term  current use of insulin ([place])    -new onset abdominal pain with nausea and vomiting; benign abdominal exam; obtain labs; urine and CBC reassuring; recommend 24 hour observation and recommend BRAT diet. If symptoms persist or acutely worsen, obtain CT abdomen and pelvis to evaluate for acute diverticulitis.  S/p colonoscopy in past five years; obtain copy of report.   -pt requesting amylase and lipase yet exam not consistent with acute pancreatitis yet at increased risk with DMII. -recommend abdominal US for vague ongoing GI symptoms for several months that symptoms have been attributed to diabetes medications.  -refer for mammogram per patient request as well. -rx for Zofran provided for ongoing nausea.  Orders Placed This Encounter  Procedures  .  Urine Culture  . US Abdomen Complete    Standing Status:   Future    Number of Occurrences:   1    Standing Expiration Date:   12/07/2017    Scheduling Instructions:     Not Thursday or Friday unless after 5:00pm; Saturdays are good.    Order Specific Question:   Reason for Exam (SYMPTOM  OR DIAGNOSIS REQUIRED)    Answer:   nausea and vomting    Order Specific Question:   Preferred imaging location?    Answer:   [facility]  . MM SCREENING BREAST TOMO BILATERAL    Standing Status:   Future    Number of Occurrences:   1    Standing Expiration Date:   12/08/2017    Order Specific Question:   Reason for Exam (SYMPTOM  OR DIAGNOSIS REQUIRED)    Answer:   annual screening    Order Specific Question:   Is the patient pregnant?    Answer:   No    Order Specific Question:   Preferred imaging location?    Answer:   [facility]  . Comprehensive metabolic panel  . Amylase  . Lipase  . Lipase  . Amylase  . Specimen status report  . POCT CBC  . POCT urinalysis dipstick   Meds ordered this encounter  Medications  . ondansetron (ZOFRAN-ODT) 8 MG disintegrating tablet    Sig: Take 1 tablet (8 mg total) by mouth every 8 (eight)  hours as needed for nausea.    Dispense:  20 tablet    Refill:  0    No Follow-up on file.   Scottie Metayer Elayne Guerin, M.D. [facility] at [facility] [address] [place], [place]  [phone] phone [phone] fax

## 2016-10-08 ENCOUNTER — Ambulatory Visit (HOSPITAL_BASED_OUTPATIENT_CLINIC_OR_DEPARTMENT_OTHER)
Admission: RE | Admit: 2016-10-08 | Discharge: 2016-10-08 | Disposition: A | Discharge status: Home / Self Care | Payer: 59 | Source: Ambulatory Visit | Attending: Family Medicine | Admitting: Family Medicine

## 2016-10-08 ENCOUNTER — Encounter (HOSPITAL_BASED_OUTPATIENT_CLINIC_OR_DEPARTMENT_OTHER): Payer: Self-pay

## 2016-10-08 ENCOUNTER — Encounter: Payer: Self-pay | Admitting: Family Medicine

## 2016-10-08 DIAGNOSIS — R112 Nausea with vomiting, unspecified: Secondary | ICD-10-CM | POA: Insufficient documentation

## 2016-10-08 DIAGNOSIS — D3000 Benign neoplasm of unspecified kidney: Secondary | ICD-10-CM | POA: Insufficient documentation

## 2016-10-08 DIAGNOSIS — R1011 Right upper quadrant pain: Secondary | ICD-10-CM | POA: Diagnosis not present

## 2016-10-08 DIAGNOSIS — K760 Fatty (change of) liver, not elsewhere classified: Secondary | ICD-10-CM | POA: Diagnosis not present

## 2016-10-08 DIAGNOSIS — Z1231 Encounter for screening mammogram for malignant neoplasm of breast: Secondary | ICD-10-CM | POA: Diagnosis not present

## 2016-10-08 DIAGNOSIS — Z1239 Encounter for other screening for malignant neoplasm of breast: Secondary | ICD-10-CM

## 2016-10-08 LAB — COMPREHENSIVE METABOLIC PANEL
ALT: 22 IU/L (ref 0–32)
AST: 20 IU/L (ref 0–40)
Albumin/Globulin Ratio: 1.6 (ref 1.2–2.2)
Albumin: 4.1 g/dL (ref 3.5–5.5)
Alkaline Phosphatase: 89 IU/L (ref 39–117)
BUN/Creatinine Ratio: 15 (ref 9–23)
BUN: 11 mg/dL (ref 6–24)
Bilirubin Total: <0.2 mg/dL (ref 0.0–1.2)
CALCIUM: 9.6 mg/dL (ref 8.7–10.2)
CHLORIDE: 98 mmol/L (ref 96–106)
CO2: 20 mmol/L (ref 20–29)
Creatinine, Ser: 0.71 mg/dL (ref 0.57–1.00)
GFR, EST AFRICAN AMERICAN: 112 mL/min/{1.73_m2} (ref >59)
GFR, EST NON AFRICAN AMERICAN: 97 mL/min/{1.73_m2} (ref >59)
Globulin, Total: 2.6 g/dL (ref 1.5–4.5)
Glucose: 110 mg/dL — ABNORMAL HIGH (ref 65–99)
Potassium: 4.5 mmol/L (ref 3.5–5.2)
Sodium: 135 mmol/L (ref 134–144)
TOTAL PROTEIN: 6.7 g/dL (ref 6.0–8.5)

## 2016-10-08 LAB — URINE CULTURE

## 2016-10-09 ENCOUNTER — Encounter: Payer: Self-pay | Admitting: Family Medicine

## 2016-10-09 NOTE — Telephone Encounter (Signed)
Pt called to check the status of MRI req.  Please call to as pt is still having discomfort [phone]

## 2016-10-10 ENCOUNTER — Emergency Department
Admission: EM | Admit: 2016-10-10 | Discharge: 2016-10-10 | Disposition: A | Discharge status: Home / Self Care | Payer: 59 | Source: Home / Self Care | Attending: Family Medicine | Admitting: Family Medicine

## 2016-10-10 ENCOUNTER — Encounter: Payer: Self-pay | Admitting: Emergency Medicine

## 2016-10-10 ENCOUNTER — Telehealth: Payer: Self-pay | Admitting: Emergency Medicine

## 2016-10-10 DIAGNOSIS — R1084 Generalized abdominal pain: Secondary | ICD-10-CM | POA: Diagnosis not present

## 2016-10-10 DIAGNOSIS — R112 Nausea with vomiting, unspecified: Secondary | ICD-10-CM

## 2016-10-10 NOTE — ED Provider Notes (Signed)
[facility]    CSN: [id number] Arrival date & time: 10/10/16  1505     History   Chief Complaint Chief Complaint  Patient presents with  . Abdominal Pain    HPI Alice Jones is a 56 y.o. female.   HPI Alice Jones is a 56 y.o. female presenting to UC with c/o RUQ aching pain that radiates throughout her abdomen.  Pain is sharp to touch at times. Associated nausea and vomiting with last episode of vomiting yesterday.  She was seen by her PCP on Wednesday 9/19 when symptoms started.  She had an U/S abdomin complete on 9/20, which showed no gall stones or wall thickening of gallbladder.  No Murphy's sign but it did show hepatic steatosis with probable fatty sparing near gallbladder fossa.  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  Pt received a message in her MyChart that the ordering provided, Dr. Reginia Forts recommended a CT or MRI of her abdomen for further evaluation of the ultrasound findings, however, pt has not heard back about scheduling f/u imaging.  Pt is concerned and is requesting CT or MRI be ordered today in UC.   Denies fever. Denies vomiting today. Mild nausea. Abdominal pain is aching, mild at this time. No urinary symptoms.    Past Medical History:  Diagnosis Date  . Allergic rhinitis   . Carpal tunnel syndrome   . Complication of anesthesia    pt states difficulty with achieving adequate sedation. ? due to narcolepsy med  . Diabetes mellitus    Dr. Chalmers Cater  . Herniated cervical disc   . Hyperlipemia   . Hypothyroid   . Narcolepsy   . Thyroid disease     Patient Active Problem List   Diagnosis Date Noted  . Narcolepsy   . Primary narcolepsy without cataplexy 11/26/2015  . Other seasonal allergic rhinitis 03/16/2014  . Controlled hypersomnia 03/16/2014  . Nocturnal hypoxia 11/01/2013  . Narcolepsy without cataplexy(347.00) 10/13/2013  . Sleep related choking sensation 07/17/2013  .  Snoring 07/17/2013  . Obesity (BMI 30.0-34.9) 12/08/2012  . 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 Dizziness 09/01/2011  . Glaucoma, narrow-angle 11/13/2010  . Allergic rhinitis   . Diabetes ([place])   . Hyperlipemia   . Herniated cervical disc     Past Surgical History:  Procedure Laterality Date  . APPENDECTOMY  09/2009  . BREAST BIOPSY Right   . CARPAL TUNNEL RELEASE Right 11/03/2015  . CERVICAL FUSION  12/09, 12/10   C6/7, C5/6  . MM BREAST STEREO BX*R*R/S Right   . REDUCTION MAMMAPLASTY    . REFRACTIVE SURGERY Left 09/2013  . SHOULDER SURGERY     Right  . TENNIS ELBOW RELEASE/NIRSCHEL PROCEDURE Right 01/10/2016   Procedure: Right Elbow Repair;  Surgeon: Melrose Nakayama, MD;  Location: [facility];  Service: Orthopedics;  Laterality: Right;    OB History    Gravida Para Term Preterm AB Living   1       1     SAB TAB Ectopic Multiple Live Births   1              Obstetric Comments   1 Adopted child       Home Medications    Prior to Admission medications   Medication Sig Start Date End Date Taking? Authorizing Provider  ACCU-CHEK SMARTVIEW test strip  11/17/13   [provider]  Armodafinil 250 MG tablet TAKE 1 TABLET BY MOUTH  EVERY MORNING WITHOUT CAFFEINE 06/16/16   Dohmeier, Asencion Partridge, MD  Azelastine-Fluticasone [facility]) 137-50 MCG/ACT SUSP Place 2 sprays into the nose daily.     [provider]  cholecalciferol (VITAMIN D) 1000 UNITS tablet Take 1,000 Units by mouth daily.    [provider]  Coenzyme Q10 (COQ10) 100 MG CAPS Take 1 capsule by mouth daily.    [provider]  diphenhydrAMINE (BENADRYL) 25 MG tablet Take 12.5-25 mg by mouth at bedtime.     [provider]  Esomeprazole Magnesium (NEXIUM PO) Take 20 mg by mouth daily.     [provider]  Exenatide ER 2 MG PEN Inject 2 mg into the skin once a week.    [provider]  fish oil-omega-3 fatty acids 1000 MG capsule Take 2 g by mouth  daily.    [provider]  levothyroxine (SYNTHROID, LEVOTHROID) 137 MCG tablet Take 137 mcg by mouth daily before breakfast.    [provider]  metFORMIN (GLUCOPHAGE-XR) 500 MG 24 hr tablet Take 1,000 mg by mouth 2 (two) times daily with a meal. Actually doing 500 AM and 1000 PM.    [provider]  Multiple Vitamins-Minerals (MULTIVITAMINS) CHEW Chew 1 tablet by mouth daily.    [provider]  NON FORMULARY Take 1 capsule by mouth daily. Tumeric    [provider]  NON FORMULARY Take 2 capsules by mouth daily. Elysium supplement    [provider]  ondansetron (ZOFRAN-ODT) 8 MG disintegrating tablet Take 1 tablet (8 mg total) by mouth every 8 (eight) hours as needed for nausea. 10/07/16   Wardell Honour, MD    Family History Family History  Problem Relation Age of Onset  . Cancer Mother        tongue  . Diabetes Maternal Grandfather     Social History Social History  Substance Use Topics  . Smoking status: Never Smoker  . Smokeless tobacco: Never Used  . Alcohol use 0.0 oz/week     Comment: 2-3 beers weekly     Allergies   Liraglutide   Review of Systems Review of Systems  Constitutional: Negative for appetite change, chills and fever.  Gastrointestinal: Positive for abdominal pain, nausea and vomiting. Negative for diarrhea.  Genitourinary: Positive for flank pain (Right). Negative for decreased urine volume, dysuria, frequency, pelvic pain and urgency.  Musculoskeletal: Negative for back pain and myalgias.     Physical Exam Triage Vital Signs ED Triage Vitals  Enc Vitals Group     BP 10/10/16 1535 (!) 151/85     Pulse Rate 10/10/16 1535 92     Resp 10/10/16 1535 16     Temp 10/10/16 1535 98.2 F (36.8 C)     Temp Source 10/10/16 1535 Oral     SpO2 10/10/16 1535 96 %     Weight 10/10/16 1535 171 lb (77.6 kg)     Height 10/10/16 1535 5\' 2"  (1.575 m)     Head Circumference --      Peak Flow --      Pain  Score 10/10/16 1536 4     Pain Loc --      Pain Edu? --      Excl. in [place]? --    No data found.   Updated Vital Signs BP (!) 151/85 (BP Location: Left Arm)   Pulse 92   Temp 98.2 F (36.8 C) (Oral)   Resp 16   Ht 5\' 2"  (1.575 m)   Wt  171 lb (77.6 kg)   LMP 09/23/2016 (Approximate)   SpO2 96%   BMI 31.28 kg/m      Physical Exam  Constitutional: She is oriented to person, place, and time. She appears well-developed and well-nourished. No distress.  HENT:  Head: Normocephalic and atraumatic.  Mouth/Throat: Oropharynx is clear and moist.  Eyes: EOM are normal.  Neck: Normal range of motion.  Cardiovascular: Normal rate and regular rhythm.   Pulmonary/Chest: Effort normal and breath sounds normal. No respiratory distress. She has no wheezes. She has no rales.  Abdominal: Soft. Bowel sounds are normal. She exhibits no distension and no mass. There is tenderness. There is no rebound, no guarding and no CVA tenderness. No hernia.  Musculoskeletal: Normal range of motion.  Neurological: She is alert and oriented to person, place, and time.  Skin: Skin is warm and dry. She is not diaphoretic.  Psychiatric: She has a normal mood and affect. Her behavior is normal.  Nursing note and vitals reviewed.    UC Treatments / Results  Labs (all labs ordered are listed, but only abnormal results are displayed) Labs Reviewed - No data to display  EKG  EKG Interpretation None       Radiology No results found.  Procedures Procedures (including critical care time)  Medications Ordered in UC Medications - No data to display   Initial Impression / Assessment and Plan / UC Course  I have reviewed the triage vital signs and the nursing notes.  Pertinent labs & imaging results that were available during my care of the patient were reviewed by me and considered in my medical decision making (see chart for details).     Pt c/o generalized abdominal pain with nausea and vomiting  for about 5 days.  Abnormal abdominal U/S from 2 days ago. Pt cannot get in contact with provider who ordered the U/S and recommended f/u CT or MRI.  Pt is concerned and wants additional imaging ordered.  Pt is afebrile. Non-toxic appearing. Mild diffuse abdominal tenderness w/o rebound, guarding, or masses. Labs from 9/19 include CBC: WBC 11.5, CMP: WNL. Amylase and Lipase were ordered, however, no results shown.   Advised pt that CT and MRI unfortunately cannot be ordered outpatient on nights or weekends from UC due to new protocols.  However, advised pt a CT abdomen pelvis w/ contrast could be ordered for Monday at [facility].   If concerned symptoms are worsening, encouraged pt to go to emergency department where CT may be ordered, however, MRI would be determined by treating provider at that time in the emergency department setting.   Pt states she would like to discuss with family. Will call UC if she decides she would like CT to be ordered for Monday.  Discussed symptoms that warrant emergent care in the ED.   Final Clinical Impressions(s) / UC Diagnoses   Final diagnoses:  Generalized abdominal pain  Non-intractable vomiting with nausea, unspecified vomiting type    New Prescriptions Discharge Medication List as of 10/10/2016  3:50 PM       Controlled Substance Prescriptions [place] Controlled Substance Registry consulted? Not Applicable   Tyrell Antonio 10/10/16 1625

## 2016-10-10 NOTE — ED Triage Notes (Signed)
Patient presents to [facility] with C/O pain in the RUQ achy and sharp with touch. Pain and nausea since Monday, patient seen by PCP on Monday she had a CT scan which she brought the results with her. She is here because she want to get an order for a possible MRI. Vomiting times 3 on Monday, and vomiting 1 times yesterday.

## 2016-10-10 NOTE — Telephone Encounter (Signed)
Order placed for CT abdomen pelvis with contrast. Pt expected to have CT performed at [facility] on Monday 10/12/16.

## 2016-10-10 NOTE — Telephone Encounter (Signed)
Patient called and would like to proceed with the CT scan for Monday morning.  Patient will call CT Monday morning @ [phone] as instructed per Leeroy Cha, PA.

## 2016-10-12 ENCOUNTER — Ambulatory Visit (INDEPENDENT_AMBULATORY_CARE_PROVIDER_SITE_OTHER): Payer: 59

## 2016-10-12 DIAGNOSIS — D259 Leiomyoma of uterus, unspecified: Secondary | ICD-10-CM | POA: Diagnosis not present

## 2016-10-12 DIAGNOSIS — M5137 Other intervertebral disc degeneration, lumbosacral region: Secondary | ICD-10-CM

## 2016-10-12 DIAGNOSIS — R1110 Vomiting, unspecified: Secondary | ICD-10-CM | POA: Diagnosis not present

## 2016-10-12 DIAGNOSIS — J3089 Other allergic rhinitis: Secondary | ICD-10-CM | POA: Diagnosis not present

## 2016-10-12 DIAGNOSIS — J301 Allergic rhinitis due to pollen: Secondary | ICD-10-CM | POA: Diagnosis not present

## 2016-10-12 MED ORDER — IOPAMIDOL (ISOVUE-300) INJECTION 61%
100.0000 mL | Freq: Once | INTRAVENOUS | Status: AC | PRN
  Administered 2016-10-12: 100 mL via INTRAVENOUS

## 2016-10-12 NOTE — Telephone Encounter (Signed)
Discussed negative CT abdomen results with patient.  She reports persistent loose stools, abdominal bloating, and recurring nausea for about a year.  She reports that she had a negative screening colonoscopy approximately one year ago.  Review of chart records reveal persistent mild leukocytosis during the past several years.  Recommend follow-up with GI for further evaluation.

## 2016-10-15 LAB — AMYLASE: Amylase: 199 U/L — ABNORMAL HIGH (ref 31–124)

## 2016-10-15 LAB — LIPASE: LIPASE: 29 U/L (ref 14–72)

## 2016-10-15 LAB — SPECIMEN STATUS REPORT

## 2016-10-15 MED FILL — LEVOTHYROXINE 150 MCG TAB: 150 | 30 days supply | Qty: 30 | Fill #4

## 2016-10-15 MED FILL — METFORMIN HCL ER 500 MG TAB: 500 | 90 days supply | Qty: 360 | Fill #1

## 2016-10-16 ENCOUNTER — Telehealth: Payer: Self-pay | Admitting: Family Medicine

## 2016-10-16 DIAGNOSIS — J3089 Other allergic rhinitis: Secondary | ICD-10-CM | POA: Diagnosis not present

## 2016-10-16 DIAGNOSIS — J301 Allergic rhinitis due to pollen: Secondary | ICD-10-CM | POA: Diagnosis not present

## 2016-10-16 MED FILL — ARMODAFINIL 250 MG TABLET: 250 | 30 days supply | Qty: 30 | Fill #4

## 2016-10-16 NOTE — Telephone Encounter (Signed)
Pt called & asked if Dr. Redmond School would take her on as new pt and he agreed.  Left message for pt

## 2016-10-22 ENCOUNTER — Other Ambulatory Visit: Payer: Self-pay | Admitting: Gastroenterology

## 2016-10-22 DIAGNOSIS — R112 Nausea with vomiting, unspecified: Secondary | ICD-10-CM

## 2016-10-22 DIAGNOSIS — R1013 Epigastric pain: Secondary | ICD-10-CM | POA: Diagnosis not present

## 2016-10-22 DIAGNOSIS — R194 Change in bowel habit: Secondary | ICD-10-CM | POA: Diagnosis not present

## 2016-10-22 DIAGNOSIS — R1011 Right upper quadrant pain: Secondary | ICD-10-CM | POA: Diagnosis not present

## 2016-10-22 NOTE — Progress Notes (Signed)
Alice Hennen MD 

## 2016-10-28 DIAGNOSIS — R1310 Dysphagia, unspecified: Secondary | ICD-10-CM | POA: Diagnosis not present

## 2016-10-28 DIAGNOSIS — R112 Nausea with vomiting, unspecified: Secondary | ICD-10-CM | POA: Diagnosis not present

## 2016-10-28 DIAGNOSIS — K219 Gastro-esophageal reflux disease without esophagitis: Secondary | ICD-10-CM | POA: Diagnosis not present

## 2016-10-28 DIAGNOSIS — R1013 Epigastric pain: Secondary | ICD-10-CM | POA: Diagnosis not present

## 2016-10-28 DIAGNOSIS — R1011 Right upper quadrant pain: Secondary | ICD-10-CM | POA: Diagnosis not present

## 2016-10-28 DIAGNOSIS — D130 Benign neoplasm of esophagus: Secondary | ICD-10-CM | POA: Diagnosis not present

## 2016-10-28 DIAGNOSIS — K317 Polyp of stomach and duodenum: Secondary | ICD-10-CM | POA: Diagnosis not present

## 2016-10-28 HISTORY — PX: UPPER GI ENDOSCOPY: SHX6162

## 2016-10-28 LAB — HM COLONOSCOPY

## 2016-10-29 DIAGNOSIS — J301 Allergic rhinitis due to pollen: Secondary | ICD-10-CM | POA: Diagnosis not present

## 2016-10-29 DIAGNOSIS — J3089 Other allergic rhinitis: Secondary | ICD-10-CM | POA: Diagnosis not present

## 2016-11-03 ENCOUNTER — Ambulatory Visit (HOSPITAL_COMMUNITY)
Admission: RE | Admit: 2016-11-03 | Discharge: 2016-11-03 | Disposition: A | Discharge status: Home / Self Care | Payer: 59 | Source: Ambulatory Visit | Attending: Gastroenterology | Admitting: Gastroenterology

## 2016-11-03 DIAGNOSIS — R112 Nausea with vomiting, unspecified: Secondary | ICD-10-CM | POA: Diagnosis not present

## 2016-11-03 DIAGNOSIS — R109 Unspecified abdominal pain: Secondary | ICD-10-CM | POA: Diagnosis not present

## 2016-11-03 MED ORDER — TECHNETIUM TC 99M MEBROFENIN IV KIT
5.1000 | PACK | Freq: Once | INTRAVENOUS | Status: AC | PRN
  Administered 2016-11-03: 5.1 via INTRAVENOUS

## 2016-11-04 ENCOUNTER — Other Ambulatory Visit: Payer: Self-pay | Admitting: Gastroenterology

## 2016-11-04 DIAGNOSIS — R112 Nausea with vomiting, unspecified: Secondary | ICD-10-CM

## 2016-11-04 NOTE — Progress Notes (Signed)
Alice Moosman MD 

## 2016-11-06 DIAGNOSIS — J3089 Other allergic rhinitis: Secondary | ICD-10-CM | POA: Diagnosis not present

## 2016-11-06 DIAGNOSIS — J301 Allergic rhinitis due to pollen: Secondary | ICD-10-CM | POA: Diagnosis not present

## 2016-11-12 ENCOUNTER — Ambulatory Visit (INDEPENDENT_AMBULATORY_CARE_PROVIDER_SITE_OTHER): Payer: 59 | Admitting: Family Medicine

## 2016-11-12 ENCOUNTER — Encounter: Payer: Self-pay | Admitting: Family Medicine

## 2016-11-12 ENCOUNTER — Telehealth: Payer: Self-pay | Admitting: Family Medicine

## 2016-11-12 VITALS — BP 134/82 | HR 83 | Temp 98.2°F | Ht 61.0 in | Wt 171.6 lb

## 2016-11-12 DIAGNOSIS — G47419 Narcolepsy without cataplexy: Secondary | ICD-10-CM

## 2016-11-12 DIAGNOSIS — E119 Type 2 diabetes mellitus without complications: Secondary | ICD-10-CM | POA: Diagnosis not present

## 2016-11-12 DIAGNOSIS — R109 Unspecified abdominal pain: Secondary | ICD-10-CM | POA: Diagnosis not present

## 2016-11-12 DIAGNOSIS — J301 Allergic rhinitis due to pollen: Secondary | ICD-10-CM | POA: Diagnosis not present

## 2016-11-12 DIAGNOSIS — Z23 Encounter for immunization: Secondary | ICD-10-CM | POA: Diagnosis not present

## 2016-11-12 DIAGNOSIS — L501 Idiopathic urticaria: Secondary | ICD-10-CM

## 2016-11-12 DIAGNOSIS — J3089 Other allergic rhinitis: Secondary | ICD-10-CM | POA: Diagnosis not present

## 2016-11-12 MED ORDER — ARMODAFINIL 250 MG PO TABS: ORAL_TABLET | ORAL | 3 refills | Status: DC

## 2016-11-12 MED FILL — ARMODAFINIL 250 MG TABLET: 250 | 30 days supply | Qty: 30 | Fill #5

## 2016-11-12 MED FILL — ACCU-CHEK GUIDE TEST STRIP: 30 days supply | Qty: 200 | Fill #1

## 2016-11-12 MED FILL — LEVOTHYROXINE 150 MCG TAB: 150 | 30 days supply | Qty: 30 | Fill #5

## 2016-11-12 NOTE — Progress Notes (Signed)
   Subjective:    Patient ID: Alice Jones, female    DOB: 1960/11/15, 56 y.o.   MRN: [id number]  HPI He is here for consultation and to establish care. She has been having difficulty recently with abdominal pain, nausea, fever. She notes this especially in the morning and also midafternoon. She has had gallbladder ultrasound as well as endoscopy done. She is also had HIDA scan which was negative. She sees Dr. Collene Mares who is interested in doing a gastric emptying study. She also sees Dr. Chalmers Cater for her diabetes and Dr. Brett Fairy for treatment of narcolepsy. Apparently she has been very stable on her narcolepsy medication. She also has a history of urticaria for the last 45 years and does use Benadryl to control these symptoms.   Review of Systems     Objective:   Physical Exam Alert and in no distress otherwise not examined       Assessment & Plan:  Abdominal pain, unspecified abdominal location  Narcolepsy without cataplexy - Plan: Armodafinil 250 MG tablet, Pneumococcal conjugate vaccine 13-valent  Need for vaccination against Streptococcus pneumoniae  Type 2 diabetes mellitus without complication, without long-term current use of insulin (HCC)  Chronic idiopathic urticaria I discussed the abdominal symptoms with her. I agree with the fact that it is probably reasonable stop all of her medications as someone she is on specifically the GLP-1 can cause gastric trouble. Also explained that sometimes a PPI can be difficult. She is to keep track of when her symptoms occur in regard to foods in particular. I will renew her Nuvigil. Also recommended she come in at the beginning of the year for complete exam and blood work. She has been getting her blood work through the endocrinologist however I'm not sure about her immune status. Presently she apparently is not on a blood pressure med or a statin. Over 25 minutes, the entire time spent in counseling and coordination of care

## 2016-11-12 NOTE — Telephone Encounter (Signed)
Called Armodafinil into [facility], pt informed

## 2016-11-19 DIAGNOSIS — J3089 Other allergic rhinitis: Secondary | ICD-10-CM | POA: Diagnosis not present

## 2016-11-19 DIAGNOSIS — J301 Allergic rhinitis due to pollen: Secondary | ICD-10-CM | POA: Diagnosis not present

## 2016-11-24 ENCOUNTER — Ambulatory Visit (HOSPITAL_COMMUNITY): Payer: 59

## 2016-11-25 ENCOUNTER — Ambulatory Visit: Payer: 59 | Admitting: Neurology

## 2016-12-09 DIAGNOSIS — J3089 Other allergic rhinitis: Secondary | ICD-10-CM | POA: Diagnosis not present

## 2016-12-09 DIAGNOSIS — J301 Allergic rhinitis due to pollen: Secondary | ICD-10-CM | POA: Diagnosis not present

## 2016-12-09 MED FILL — BYDUREON 2 MG PEN INJECT: 2 | 84 days supply | Qty: 12 | Fill #1

## 2016-12-21 MED FILL — LEVOTHYROXINE 150 MCG TAB: 150 | 30 days supply | Qty: 30 | Fill #6

## 2016-12-21 MED FILL — ARMODAFINIL 250 MG TABLET: 250 | 30 days supply | Qty: 30 | Fill #0

## 2016-12-25 DIAGNOSIS — J3089 Other allergic rhinitis: Secondary | ICD-10-CM | POA: Diagnosis not present

## 2016-12-25 DIAGNOSIS — J301 Allergic rhinitis due to pollen: Secondary | ICD-10-CM | POA: Diagnosis not present

## 2016-12-30 DIAGNOSIS — H40033 Anatomical narrow angle, bilateral: Secondary | ICD-10-CM | POA: Diagnosis not present

## 2016-12-30 DIAGNOSIS — H11823 Conjunctivochalasis, bilateral: Secondary | ICD-10-CM | POA: Diagnosis not present

## 2016-12-30 DIAGNOSIS — E119 Type 2 diabetes mellitus without complications: Secondary | ICD-10-CM | POA: Diagnosis not present

## 2017-01-01 DIAGNOSIS — J301 Allergic rhinitis due to pollen: Secondary | ICD-10-CM | POA: Diagnosis not present

## 2017-01-01 DIAGNOSIS — J3089 Other allergic rhinitis: Secondary | ICD-10-CM | POA: Diagnosis not present

## 2017-01-05 LAB — HM DIABETES EYE EXAM

## 2017-01-06 ENCOUNTER — Encounter: Payer: Self-pay | Admitting: Family Medicine

## 2017-01-15 MED FILL — LEVOTHYROXINE 150 MCG TAB: 150 | 30 days supply | Qty: 30 | Fill #7

## 2017-01-15 MED FILL — ARMODAFINIL 250 MG TABLET: 250 | 30 days supply | Qty: 30 | Fill #1

## 2017-01-28 DIAGNOSIS — J3089 Other allergic rhinitis: Secondary | ICD-10-CM | POA: Diagnosis not present

## 2017-01-28 DIAGNOSIS — J301 Allergic rhinitis due to pollen: Secondary | ICD-10-CM | POA: Diagnosis not present

## 2017-02-02 DIAGNOSIS — J301 Allergic rhinitis due to pollen: Secondary | ICD-10-CM | POA: Diagnosis not present

## 2017-02-02 DIAGNOSIS — J3089 Other allergic rhinitis: Secondary | ICD-10-CM | POA: Diagnosis not present

## 2017-02-09 DIAGNOSIS — E7800 Pure hypercholesterolemia, unspecified: Secondary | ICD-10-CM | POA: Diagnosis not present

## 2017-02-09 DIAGNOSIS — E039 Hypothyroidism, unspecified: Secondary | ICD-10-CM | POA: Diagnosis not present

## 2017-02-09 DIAGNOSIS — I10 Essential (primary) hypertension: Secondary | ICD-10-CM | POA: Diagnosis not present

## 2017-02-09 DIAGNOSIS — E1165 Type 2 diabetes mellitus with hyperglycemia: Secondary | ICD-10-CM | POA: Diagnosis not present

## 2017-02-09 MED FILL — UNIFINE PENTIPS 31GX3/16": 31G X 5 MM | 90 days supply | Qty: 100 | Fill #0

## 2017-02-09 MED FILL — LEVOTHYROXINE 150 MCG TAB: 150 | 30 days supply | Qty: 30 | Fill #8

## 2017-02-09 MED FILL — TRESIBA FLEXTOUCH 100 UNITS: 100 | 90 days supply | Qty: 15 | Fill #0

## 2017-02-09 MED FILL — UNIFINE PENTIPS 31GX3/16: 31G X 5 MM | 90 days supply | Qty: 100 | Fill #0

## 2017-02-09 MED FILL — ARMODAFINIL 250 MG TABLET: 250 | 30 days supply | Qty: 30 | Fill #2

## 2017-02-15 ENCOUNTER — Telehealth: Payer: 59 | Admitting: Nurse Practitioner

## 2017-02-15 ENCOUNTER — Telehealth: Payer: Self-pay | Admitting: Family Medicine

## 2017-02-15 DIAGNOSIS — R059 Cough, unspecified: Secondary | ICD-10-CM

## 2017-02-15 DIAGNOSIS — R05 Cough: Secondary | ICD-10-CM | POA: Diagnosis not present

## 2017-02-15 DIAGNOSIS — J029 Acute pharyngitis, unspecified: Secondary | ICD-10-CM

## 2017-02-15 MED ORDER — OSELTAMIVIR PHOSPHATE 75 MG PO CAPS: 75.0000 mg | ORAL_CAPSULE | Freq: Two times a day (BID) | ORAL | 0 refills | Status: DC

## 2017-02-15 MED FILL — OSELTAMIVIR PHOSPHATE 75 MG: 75 | 5 days supply | Qty: 10 | Fill #0

## 2017-02-15 NOTE — Telephone Encounter (Signed)
Pt requesting script for Tamiflu since she is starting to have cold, congestion, sore throat, low grade fever. Pt has been exposed to 2 family members in her household with the flu. One is still on Tamiflu and the other was just very sick with the flu. One of the family members work in the [employer] and is/was exposed to flu.

## 2017-02-15 NOTE — Progress Notes (Signed)

## 2017-02-16 ENCOUNTER — Encounter: Payer: Self-pay | Admitting: Nurse Practitioner

## 2017-02-16 ENCOUNTER — Ambulatory Visit (INDEPENDENT_AMBULATORY_CARE_PROVIDER_SITE_OTHER): Payer: Self-pay | Admitting: Nurse Practitioner

## 2017-02-16 VITALS — BP 100/74 | HR 76 | Temp 98.5°F | Wt 172.2 lb

## 2017-02-16 DIAGNOSIS — R05 Cough: Secondary | ICD-10-CM

## 2017-02-16 DIAGNOSIS — R059 Cough, unspecified: Secondary | ICD-10-CM

## 2017-02-16 DIAGNOSIS — J069 Acute upper respiratory infection, unspecified: Secondary | ICD-10-CM

## 2017-02-16 MED ORDER — PSEUDOEPH-BROMPHEN-DM 30-2-10 MG/5ML PO SYRP: 5.0000 mL | ORAL_SOLUTION | Freq: Four times a day (QID) | ORAL | 0 refills | Status: AC | PRN

## 2017-02-16 MED ORDER — AZITHROMYCIN 250 MG PO TABS: ORAL_TABLET | ORAL | 0 refills | Status: AC

## 2017-02-16 NOTE — Progress Notes (Signed)
Subjective:  Alice Jones is a 57 y.o. female who presents for evaluation of URI like symptoms.  Symptoms include achiness, chills, productive cough with yellow colored sputum, sinus pressure and wheezing.  Onset of symptoms was 3 days ago, and has been rapidly worsening since that time.  Treatment to date:  cough suppressants and decongestants. Patient was exposed to influenza, started Tamiflu on 1/28.  High risk factors for influenza complications:  none.  The following portions of the patient's history were reviewed and updated as appropriate:  allergies, current medications and past medical history.  Constitutional: positive for chills, fatigue and malaise, negative for anorexia, night sweats and sweats Eyes: negative Ears, nose, mouth, throat, and face: positive for nasal congestion and sore throat, negative for ear drainage, earaches and hoarseness Respiratory: positive for cough, sputum, wheezing and history of bronchitis, negative for asthma, dyspnea on exertion, pleurisy/chest pain, pneumonia and stridor Cardiovascular: negative Gastrointestinal: negative Neurological: positive for headaches Behavioral/Psych: negative Objective:  BP 100/74   Pulse 76   Temp 98.5 F (36.9 C)   Wt 172 lb 3.2 oz (78.1 kg)   SpO2 97%   BMI 32.54 kg/m  General appearance: alert, cooperative, fatigued and no distress Head: Normocephalic, without obvious abnormality, atraumatic Eyes: conjunctivae/corneas clear. PERRL, EOM's intact. Fundi benign. Ears: normal TM's and external ear canals both ears Nose: no discharge, turbinates swollen, inflamed, no sinus tenderness Throat: abnormal findings: mild oropharyngeal erythema Lungs: clear to auscultation bilaterally Heart: regular rate and rhythm, S1, S2 normal, no murmur, click, rub or gallop Abdomen: soft, non-tender; bowel sounds normal; no masses,  no organomegaly Pulses: 2+ and symmetric Skin: Skin color, texture, turgor normal. No rashes or  lesions Lymph nodes: cervical lymphadenopathy, right Neurologic: Grossly normal    Assessment:  Acute Upper Respiratory Infection    Plan:  Discussed diagnosis and treatment of URI. Educational material distributed and questions answered. Suggested symptomatic OTC remedies. Supportive care with appropriate antipyretics and fluids. Nasal saline spray for congestion. Azithromycin and Bromfed  per orders.

## 2017-02-16 NOTE — Patient Instructions (Addendum)
Upper Respiratory Infection, Adult Most upper respiratory infections (URIs) are a viral infection of the air passages leading to the lungs. A URI affects the nose, throat, and upper air passages. The most common type of URI is nasopharyngitis and is typically referred to as "the common cold." URIs run their course and usually go away on their own. Most of the time, a URI does not require medical attention, but sometimes a bacterial infection in the upper airways can follow a viral infection. This is called a secondary infection. Sinus and middle ear infections are common types of secondary upper respiratory infections. Bacterial pneumonia can also complicate a URI. A URI can worsen asthma and chronic obstructive pulmonary disease (COPD). Sometimes, these complications can require emergency medical care and may be life threatening. What are the causes? Almost all URIs are caused by viruses. A virus is a type of germ and can spread from one person to another. What increases the risk? You may be at risk for a URI if:  You smoke.  You have chronic heart or lung disease.  You have a weakened defense (immune) system.  You are very young or very old.  You have nasal allergies or asthma.  You work in crowded or poorly ventilated areas.  You work in health care facilities or schools.  What are the signs or symptoms? Symptoms typically develop 2-3 days after you come in contact with a cold virus. Most viral URIs last 7-10 days. However, viral URIs from the influenza virus (flu virus) can last 14-18 days and are typically more severe. Symptoms may include:  Runny or stuffy (congested) nose.  Sneezing.  Cough.  Sore throat.  Headache.  Fatigue.  Fever.  Loss of appetite.  Pain in your forehead, behind your eyes, and over your cheekbones (sinus pain).  Muscle aches.  How is this diagnosed? Your health care provider may diagnose a URI by:  Physical exam.  Tests to check that your  symptoms are not due to another condition such as: ? Strep throat. ? Sinusitis. ? Pneumonia. ? Asthma.  How is this treated? A URI goes away on its own with time. It cannot be cured with medicines, but medicines may be prescribed or recommended to relieve symptoms. Medicines may help:  Reduce your fever.  Reduce your cough.  Relieve nasal congestion.  Follow these instructions at home:  Take medicines only as directed by your health care provider.  Gargle warm saltwater or take cough drops to comfort your throat as directed by your health care provider.  Use a warm mist humidifier or inhale steam from a shower to increase air moisture. This may make it easier to breathe.  Drink enough fluid to keep your urine clear or pale yellow.  Eat soups and other clear broths and maintain good nutrition.  Rest as needed.  Return to work when your temperature has returned to normal or as your health care provider advises. You may need to stay home longer to avoid infecting others. You can also use a face mask and careful hand washing to prevent spread of the virus.  Increase the usage of your inhaler if you have asthma.  Do not use any tobacco products, including cigarettes, chewing tobacco, or electronic cigarettes. If you need help quitting, ask your health care provider. How is this prevented? The best way to protect yourself from getting a cold is to practice good hygiene.  Avoid oral or hand contact with people with cold symptoms.  Wash your   hands often if contact occurs.  There is no clear evidence that vitamin C, vitamin E, echinacea, or exercise reduces the chance of developing a cold. However, it is always recommended to get plenty of rest, exercise, and practice good nutrition. Contact a health care provider if:  You are getting worse rather than better.  Your symptoms are not controlled by medicine.  You have chills.  You have worsening shortness of breath.  You have  brown or red mucus.  You have yellow or brown nasal discharge.  You have pain in your face, especially when you bend forward.  You have a fever.  You have swollen neck glands.  You have pain while swallowing.  You have white areas in the back of your throat. Get help right away if:  You have severe or persistent: ? Headache. ? Ear pain. ? Sinus pain. ? Chest pain.  You have chronic lung disease and any of the following: ? Wheezing. ? Prolonged cough. ? Coughing up blood. ? A change in your usual mucus.  You have a stiff neck.  You have changes in your: ? Vision. ? Hearing. ? Thinking. ? Mood. This information is not intended to replace advice given to you by your health care provider. Make sure you discuss any questions you have with your health care provider. Document Released: 07/01/2000 Document Revised: 09/08/2015 Document Reviewed: 04/12/2013 Elsevier Interactive Patient Education  2018 Elsevier Inc.  Cough, Adult Coughing is a reflex that clears your throat and your airways. Coughing helps to heal and protect your lungs. It is normal to cough occasionally, but a cough that happens with other symptoms or lasts a long time may be a sign of a condition that needs treatment. A cough may last only 2-3 weeks (acute), or it may last longer than 8 weeks (chronic). What are the causes? Coughing is commonly caused by:  Breathing in substances that irritate your lungs.  A viral or bacterial respiratory infection.  Allergies.  Asthma.  Postnasal drip.  Smoking.  Acid backing up from the stomach into the esophagus (gastroesophageal reflux).  Certain medicines.  Chronic lung problems, including COPD (or rarely, lung cancer).  Other medical conditions such as heart failure.  Follow these instructions at home: Pay attention to any changes in your symptoms. Take these actions to help with your discomfort:  Take medicines only as told by your health care  provider. ? If you were prescribed an antibiotic medicine, take it as told by your health care provider. Do not stop taking the antibiotic even if you start to feel better. ? Talk with your health care provider before you take a cough suppressant medicine.  Drink enough fluid to keep your urine clear or pale yellow.  If the air is dry, use a cold steam vaporizer or humidifier in your bedroom or your home to help loosen secretions.  Avoid anything that causes you to cough at work or at home.  If your cough is worse at night, try sleeping in a semi-upright position.  Avoid cigarette smoke. If you smoke, quit smoking. If you need help quitting, ask your health care provider.  Avoid caffeine.  Avoid alcohol.  Rest as needed.  Contact a health care provider if:  You have new symptoms.  You cough up pus.  Your cough does not get better after 2-3 weeks, or your cough gets worse.  You cannot control your cough with suppressant medicines and you are losing sleep.  You develop pain that is   getting worse or pain that is not controlled with pain medicines.  You have a fever.  You have unexplained weight loss.  You have night sweats. Get help right away if:  You cough up blood.  You have difficulty breathing.  Your heartbeat is very fast. This information is not intended to replace advice given to you by your health care provider. Make sure you discuss any questions you have with your health care provider. Document Released: 07/04/2010 Document Revised: 06/13/2015 Document Reviewed: 03/14/2014 Elsevier Interactive Patient Education  2018 Elsevier Inc.  

## 2017-02-18 ENCOUNTER — Other Ambulatory Visit: Payer: Self-pay | Admitting: Nurse Practitioner

## 2017-02-18 MED ORDER — PREDNISONE 20 MG PO TABS: 20.0000 mg | ORAL_TABLET | Freq: Every day | ORAL | 0 refills | Status: AC

## 2017-02-18 MED FILL — predniSONE 20 MG TABS: 20 | 3 days supply | Qty: 3 | Fill #0

## 2017-02-18 NOTE — Progress Notes (Signed)
Patient called stating she isn't feeling any better and her congestion is worse.  Prednisone 20mg  po once daily for 3 days prescribed and sent to preferred pharmacy.

## 2017-02-20 ENCOUNTER — Other Ambulatory Visit: Payer: Self-pay | Admitting: Nurse Practitioner

## 2017-02-20 MED ORDER — ALBUTEROL SULFATE HFA 108 (90 BASE) MCG/ACT IN AERS: 1.0000 | INHALATION_SPRAY | Freq: Four times a day (QID) | RESPIRATORY_TRACT | 0 refills | Status: DC | PRN

## 2017-02-20 MED ORDER — AMOXICILLIN-POT CLAVULANATE 875-125 MG PO TABS: 1.0000 | ORAL_TABLET | Freq: Two times a day (BID) | ORAL | 0 refills | Status: AC

## 2017-02-20 NOTE — Progress Notes (Signed)
Patient called stating she is still SOB, coughing and has sinus pain and pressure.  Patient has completed Tamiflu and z-pak previously prescribed.  Informed patient I will send script for albuterol inhaler and augmentin.  Patient will follow up if no improvement.

## 2017-02-23 MED FILL — FREESTYLE LIBRE 14 DAY SENS: 28 days supply | Qty: 2 | Fill #0

## 2017-02-23 MED FILL — BYDUREON 2 MG PEN INJECT: 2 | 84 days supply | Qty: 12 | Fill #2

## 2017-02-25 ENCOUNTER — Encounter: Payer: Self-pay | Admitting: Family Medicine

## 2017-02-25 ENCOUNTER — Ambulatory Visit: Payer: 59 | Admitting: Family Medicine

## 2017-02-25 VITALS — BP 130/80 | HR 80 | Temp 98.2°F | Ht 61.0 in | Wt 173.0 lb

## 2017-02-25 DIAGNOSIS — J0100 Acute maxillary sinusitis, unspecified: Secondary | ICD-10-CM | POA: Diagnosis not present

## 2017-02-25 NOTE — Progress Notes (Signed)
Chief Complaint  Patient presents with  . Cough    started with a slight cough 2 weeks ago and about 10 days ago worsened. Called [place] and [facility] gave her Tamiflu last Monday, finished and no better. Then went to [facility] through [facility] and got Augmentin-still on and not getting any better, now in sinuses and coughing uncontrollably. Has has low grade temp. Needs to get better, can't work. Has been using inhaler and mucinex (stopped mucinex yesterday). Augmentin giving her terrible diarrhea.     See above.  She was at her worst this past weekend, a little better in the last few days.  She has been on Augmentin since 2/3.  Started in chest, never left the chest, but also moved up into the head and sinuses. She has had low grade fevers, never over 99.5.  Had low grade fever yesterday. Nasal drainage isn't as discolored as the weekend, was bright yellow, and has been getting lighter/better. She has an albuterol inhaler (no h/o asthma, but frequent bronchitis), has been using it, might help some.  Having some DOE.  She stayed home all last week. She had to work Monday (managed to try a custody case)--trying to lighten her schedule when she can, working half days since.  She was using Mucinex 12 hour (plain) up until yesterday.  Timeline of events: Wife had the flu, called here for Tamiflu 1/28 when she started with symptoms. 1/29 went to [facility], treated with zpak and Bromfed.  1/31 given prednisone 93m once daily for 3 days. 2/2 rx'd albuterol and augmentin  PMH, PSH, SH reviewed  Outpatient Encounter Medications as of 02/25/2017  Medication Sig Note  . ACCU-CHEK SMARTVIEW test strip  12/13/2013: Received from: External Pharmacy  . albuterol (PROVENTIL HFA) 108 (90 Base) MCG/ACT inhaler Inhale 1-2 puffs into the lungs every 6 (six) hours as needed for up to 5 days for wheezing or shortness of breath (SOB).   .Marland Kitchenamoxicillin-clavulanate (AUGMENTIN) 875-125 MG tablet Take 1 tablet by mouth 2 (two)  times daily for 10 days.   . Armodafinil 250 MG tablet TAKE 1 TABLET BY MOUTH EVERY MORNING WITHOUT CAFFEINE   . cholecalciferol (VITAMIN D) 1000 UNITS tablet Take 1,000 Units by mouth daily.   . Coenzyme Q10 (COQ10) 100 MG CAPS Take 1 capsule by mouth daily.   . Continuous Blood Gluc Sensor (FREESTYLE LIBRE 14 DAY SENSOR) MISC USE AS DIRECTED EVERY 2 WEEKS   . diphenhydrAMINE (BENADRYL) 25 MG tablet Take 12.5-25 mg by mouth at bedtime.    . Esomeprazole Magnesium (NEXIUM PO) Take 20 mg by mouth daily.  11/15/2014: sts. taking otc dose/fim  . Exenatide ER 2 MG PEN Inject 2 mg into the skin once a week.   . levothyroxine (SYNTHROID, LEVOTHROID) 137 MCG tablet Take 137 mcg by mouth daily before breakfast.   . Multiple Vitamins-Minerals (MULTIVITAMINS) CHEW Chew 1 tablet by mouth daily.   . NON FORMULARY Take 1 capsule by mouth daily. Tumeric   . NON FORMULARY Take 2 capsules by mouth daily. Elysium supplement   . TRESIBA FLEXTOUCH 100 UNIT/ML SOPN FlexTouch Pen INJECT 5 UNITS INTO THE SKIN ONCE A DAY   . fish oil-omega-3 fatty acids 1000 MG capsule Take 2 g by mouth daily.   . ondansetron (ZOFRAN-ODT) 8 MG disintegrating tablet Take 1 tablet (8 mg total) by mouth every 8 (eight) hours as needed for nausea. (Patient not taking: Reported on 02/25/2017)   . [DISCONTINUED] metFORMIN (GLUCOPHAGE-XR) 500 MG 24 hr tablet Take  1,000 mg by mouth 2 (two) times daily with a meal. Actually doing 500 AM and 1000 PM.   . [DISCONTINUED] oseltamivir (TAMIFLU) 75 MG capsule Take 1 capsule (75 mg total) by mouth 2 (two) times daily.    No facility-administered encounter medications on file as of 02/25/2017.    Allergies  Allergen Reactions  . Liraglutide     nausea    ROS:  URI symptoms per HPI (low grade fevers, cough, sinus pain/congestion). +watery diarrhea since being on the augmentin.  Some fecal incontinence with coughing. Slight nausea yesterday. No rashes No urinary complaints, bleeding, bruising. No  vomiting.  PHYSICAL EXAM:  BP 130/80   Pulse 80   Temp 98.2 F (36.8 C) (Tympanic)   Ht '5\' 1"'  (1.549 m)   Wt 173 lb (78.5 kg)   LMP 02/09/2017 (Exact Date)   BMI 32.69 kg/m   Well appearing female, with intermittent dry cough HEENT: PERRL, EOMI, conjunctiva and sclera are clear. TM's and EACs normal. Nasal mucosa is fairly normal--no erythema, no purulence. Tender over bilateral maxillary sinuses. OP is clear Neck: no lymphadenopathy or mass Heart: regular rate and rhythm, no murmur Lungs: clear bilaterally. No wheezes, rales, ronchi, good air movement Skin: normal turgor, no rash Psych: normal mood, affect, hygiene and grooming Neuro: alert and oriented, cranial nerves intact, normal gait  ASSESSMENT/PLAN:  Acute non-recurrent maxillary sinusitis - improving with Augmentin. Some diarrhea as SE--reviewed supportive measures    Stay well hydrated, drink plenty of water. Avoid dairy until at least 3-5 days after the diarrhea has resolved. I recommend taking a probiotic daily. You may use some imodium if needed (don't use it long-term for diarrhea, but just while on the antibiotics). If the diarrhea becomes intolerable, please let us know, and we can change the antibiotic, but these measures may help. If your aren't 100% better by the time you finish the Augmentin, it may need to be extended. Call us and let us know.  I recommend sinus rinses (sinus rinse kit or neti-pot) once or twice daily. Restart the Mucinex and consider taking Delsym syrup. If this doesn't help, contact us for a prescription for Tessalon perles.  Consider using a decongestant such as sudafed as needed for sinus pain and congestion.  Okay to use Afrin just at night, short-term, if needed.  Use your inhaler when you feel tightness in the chest and shortness of breath

## 2017-02-25 NOTE — Patient Instructions (Signed)
  Stay well hydrated, drink plenty of water. Avoid dairy until at least 3-5 days after the diarrhea has resolved. I recommend taking a probiotic daily. You may use some imodium if needed (don't use it long-term for diarrhea, but just while on the antibiotics). If the diarrhea becomes intolerable, please let us know, and we can change the antibiotic, but these measures may help. If your aren't 100% better by the time you finish the Augmentin, it may need to be extended. Call us and let us know.  I recommend sinus rinses (sinus rinse kit or neti-pot) once or twice daily. Restart the Mucinex and consider taking Delsym syrup. If this doesn't help, contact us for a prescription for Tessalon perles.  Consider using a decongestant such as sudafed as needed for sinus pain and congestion.  Okay to use Afrin just at night, short-term, if needed.  Use your inhaler when you feel tightness in the chest and shortness of breath

## 2017-02-26 DIAGNOSIS — J3089 Other allergic rhinitis: Secondary | ICD-10-CM | POA: Diagnosis not present

## 2017-02-26 DIAGNOSIS — J301 Allergic rhinitis due to pollen: Secondary | ICD-10-CM | POA: Diagnosis not present

## 2017-03-01 ENCOUNTER — Telehealth: Payer: Self-pay | Admitting: Family Medicine

## 2017-03-01 MED ORDER — FLUCONAZOLE 150 MG PO TABS: 150.0000 mg | ORAL_TABLET | Freq: Once | ORAL | 0 refills | Status: AC

## 2017-03-01 MED ORDER — MOXIFLOXACIN HCL 400 MG PO TABS: 400.0000 mg | ORAL_TABLET | Freq: Every day | ORAL | 0 refills | Status: DC

## 2017-03-01 MED FILL — FLUCONAZOLE 150 MG TABLET: 150 | 1 days supply | Qty: 1 | Fill #0

## 2017-03-01 MED FILL — MOXIFLOXACIN HCL 400 MG TAB: 400 | 7 days supply | Qty: 7 | Fill #0

## 2017-03-01 NOTE — Telephone Encounter (Signed)
Done

## 2017-03-01 NOTE — Telephone Encounter (Signed)
Pt called and left message that she needs her augmentin refilled and she thinks she has a yeast infection.   Please advise pt since she left message.  [phone]

## 2017-03-01 NOTE — Telephone Encounter (Signed)
Okay to change to avelox 400mg  once daily x 7 days

## 2017-03-01 NOTE — Telephone Encounter (Signed)
Okay to give another #14 (1 week) of Augmentin, and a dose of 150mg  diflucan

## 2017-03-01 NOTE — Telephone Encounter (Signed)
Patient called back and said her sinus is now yellow mucous again.  Are you sure the augmentin is the right med for her or do you want to try something else.  Please advise pt [phone]

## 2017-03-03 DIAGNOSIS — J3081 Allergic rhinitis due to animal (cat) (dog) hair and dander: Secondary | ICD-10-CM | POA: Diagnosis not present

## 2017-03-03 DIAGNOSIS — J3089 Other allergic rhinitis: Secondary | ICD-10-CM | POA: Diagnosis not present

## 2017-03-03 DIAGNOSIS — J301 Allergic rhinitis due to pollen: Secondary | ICD-10-CM | POA: Diagnosis not present

## 2017-03-17 MED FILL — FREESTYLE LIBRE 14 DAY SENS: 28 days supply | Qty: 2 | Fill #1

## 2017-03-17 MED FILL — LEVOTHYROXINE 150 MCG TAB: 150 | 30 days supply | Qty: 30 | Fill #9

## 2017-03-17 MED FILL — ARMODAFINIL 250 MG TABLET: 250 | 30 days supply | Qty: 30 | Fill #3

## 2017-03-19 DIAGNOSIS — J3089 Other allergic rhinitis: Secondary | ICD-10-CM | POA: Diagnosis not present

## 2017-03-19 DIAGNOSIS — J301 Allergic rhinitis due to pollen: Secondary | ICD-10-CM | POA: Diagnosis not present

## 2017-03-19 DIAGNOSIS — R05 Cough: Secondary | ICD-10-CM | POA: Diagnosis not present

## 2017-03-26 DIAGNOSIS — J301 Allergic rhinitis due to pollen: Secondary | ICD-10-CM | POA: Diagnosis not present

## 2017-03-26 DIAGNOSIS — J3089 Other allergic rhinitis: Secondary | ICD-10-CM | POA: Diagnosis not present

## 2017-04-07 DIAGNOSIS — J301 Allergic rhinitis due to pollen: Secondary | ICD-10-CM | POA: Diagnosis not present

## 2017-04-07 DIAGNOSIS — J3089 Other allergic rhinitis: Secondary | ICD-10-CM | POA: Diagnosis not present

## 2017-04-14 DIAGNOSIS — J301 Allergic rhinitis due to pollen: Secondary | ICD-10-CM | POA: Diagnosis not present

## 2017-04-14 DIAGNOSIS — J3089 Other allergic rhinitis: Secondary | ICD-10-CM | POA: Diagnosis not present

## 2017-04-19 MED FILL — ARMODAFINIL 250 MG TABLET: 250 | 30 days supply | Qty: 30 | Fill #4

## 2017-04-19 MED FILL — FREESTYLE LIBRE 14 DAY SENS: 28 days supply | Qty: 2 | Fill #2

## 2017-04-19 MED FILL — LEVOTHYROXINE 150 MCG TAB: 150 | 30 days supply | Qty: 30 | Fill #10

## 2017-04-23 DIAGNOSIS — J301 Allergic rhinitis due to pollen: Secondary | ICD-10-CM | POA: Diagnosis not present

## 2017-04-23 DIAGNOSIS — J3089 Other allergic rhinitis: Secondary | ICD-10-CM | POA: Diagnosis not present

## 2017-04-28 DIAGNOSIS — J301 Allergic rhinitis due to pollen: Secondary | ICD-10-CM | POA: Diagnosis not present

## 2017-04-28 DIAGNOSIS — J3089 Other allergic rhinitis: Secondary | ICD-10-CM | POA: Diagnosis not present

## 2017-05-17 ENCOUNTER — Other Ambulatory Visit: Payer: Self-pay | Admitting: Family Medicine

## 2017-05-17 DIAGNOSIS — G47419 Narcolepsy without cataplexy: Secondary | ICD-10-CM

## 2017-05-17 MED FILL — BYDUREON 2 MG PEN INJECT: 2 | 84 days supply | Qty: 12 | Fill #3

## 2017-05-17 MED FILL — ARMODAFINIL 250 MG TABLET: 250 | 90 days supply | Qty: 90 | Fill #0

## 2017-05-17 MED FILL — LEVOTHYROXINE 150 MCG TAB: 150 | 30 days supply | Qty: 30 | Fill #11

## 2017-05-17 MED FILL — UNIFINE PENTIPS 31GX3/16": 31G X 5 MM | 90 days supply | Qty: 100 | Fill #1

## 2017-05-17 MED FILL — FREESTYLE LIBRE 14 DAY SENS: 28 days supply | Qty: 2 | Fill #3

## 2017-05-17 MED FILL — UNIFINE PENTIPS 31GX3/16: 31G X 5 MM | 90 days supply | Qty: 100 | Fill #1

## 2017-05-17 NOTE — Telephone Encounter (Signed)
Alice Jones out pt is requesting to fill [name]. Please advise [facility]

## 2017-05-28 DIAGNOSIS — J3089 Other allergic rhinitis: Secondary | ICD-10-CM | POA: Diagnosis not present

## 2017-05-28 DIAGNOSIS — J301 Allergic rhinitis due to pollen: Secondary | ICD-10-CM | POA: Diagnosis not present

## 2017-05-28 DIAGNOSIS — J3081 Allergic rhinitis due to animal (cat) (dog) hair and dander: Secondary | ICD-10-CM | POA: Diagnosis not present

## 2017-06-05 ENCOUNTER — Ambulatory Visit (INDEPENDENT_AMBULATORY_CARE_PROVIDER_SITE_OTHER): Payer: Self-pay | Admitting: Family Medicine

## 2017-06-05 VITALS — BP 120/84 | HR 85 | Temp 98.4°F | Resp 16 | Wt 170.8 lb

## 2017-06-05 DIAGNOSIS — R05 Cough: Secondary | ICD-10-CM

## 2017-06-05 DIAGNOSIS — J329 Chronic sinusitis, unspecified: Secondary | ICD-10-CM

## 2017-06-05 DIAGNOSIS — J40 Bronchitis, not specified as acute or chronic: Secondary | ICD-10-CM

## 2017-06-05 DIAGNOSIS — R059 Cough, unspecified: Secondary | ICD-10-CM

## 2017-06-05 DIAGNOSIS — R062 Wheezing: Secondary | ICD-10-CM

## 2017-06-05 MED ORDER — DOXYCYCLINE HYCLATE 100 MG PO TABS: 100.0000 mg | ORAL_TABLET | Freq: Two times a day (BID) | ORAL | 0 refills | Status: DC

## 2017-06-05 MED ORDER — PREDNISONE 20 MG PO TABS: 40.0000 mg | ORAL_TABLET | Freq: Every day | ORAL | 0 refills | Status: AC

## 2017-06-05 MED ORDER — MONTELUKAST SODIUM 10 MG PO TABS: 10.0000 mg | ORAL_TABLET | Freq: Every day | ORAL | 3 refills | Status: DC

## 2017-06-05 NOTE — Patient Instructions (Signed)

## 2017-06-05 NOTE — Progress Notes (Signed)
Alice Jones is a 57 y.o. female who presents today with concerns frequent forceful cough that has persisted for 4-5 days without relief with OTC medications for cough.Patient reports running a fever and using an OTC antipyretic for symptoms.  Review of Systems  Constitutional: Negative for chills, fever and malaise/fatigue.  HENT: Positive for congestion, sinus pain and sore throat. Negative for ear discharge and ear pain.   Eyes: Negative.   Respiratory: Positive for cough. Negative for sputum production and shortness of breath.   Cardiovascular: Negative.  Negative for chest pain.  Gastrointestinal: Negative for abdominal pain, diarrhea, nausea and vomiting.  Genitourinary: Negative for dysuria, frequency, hematuria and urgency.  Musculoskeletal: Negative for myalgias.  Skin: Negative.   Neurological: Negative for headaches.  Endo/Heme/Allergies: Negative.   Psychiatric/Behavioral: Negative.     O: Vitals:   06/05/17 1250  BP: 120/84  Pulse: 85  Resp: 16  Temp: 98.4 F (36.9 C)  SpO2: 98%     Physical Exam  Constitutional: She is oriented to person, place, and time. Vital signs are normal. She appears well-developed and well-nourished. She is active.  Non-toxic appearance. She does not have a sickly appearance. She appears ill.  HENT:  Head: Normocephalic.  Right Ear: Hearing, tympanic membrane, external ear and ear canal normal.  Left Ear: Hearing, tympanic membrane, external ear and ear canal normal.  Nose: Rhinorrhea present. Right sinus exhibits maxillary sinus tenderness. Left sinus exhibits maxillary sinus tenderness.  Mouth/Throat: Uvula is midline. Posterior oropharyngeal erythema present. No tonsillar abscesses. Tonsils are 1+ on the right. Tonsils are 1+ on the left.  Neck: Normal range of motion. Neck supple.  Cardiovascular: Normal rate, regular rhythm, normal heart sounds and normal pulses.  Pulmonary/Chest: Effort normal. She has wheezes in the right  upper field and the left upper field. She has rhonchi in the right upper field, the right middle field, the right lower field, the left upper field, the left middle field and the left lower field.  Frequent dry intractable cough during exam  Abdominal: Soft. Bowel sounds are normal.  Musculoskeletal: Normal range of motion.  Lymphadenopathy:       Head (right side): No submental and no submandibular adenopathy present.       Head (left side): No submental and no submandibular adenopathy present.    She has no cervical adenopathy.  Neurological: She is alert and oriented to person, place, and time.  Skin: She is diaphoretic.  Psychiatric: She has a normal mood and affect.  Vitals reviewed.    A: 1. Sinusitis, unspecified chronicity, unspecified location   2. Bronchitis   3. Wheezing   4. Cough      P: Patient previously did not tolerate Augmentin and Azithromycin in January bout of similar symptoms dx: sinusitis  did not improve symptoms end result was treatment with Moxifloxacin discussed with patient the risks and benefits of first line treatment alternative like Doxycycline versus a last resort use of Flouroquinolone typically reserved for non responsive or lingering symptoms. Agrees to trial first line alternative to Augmentin. Will call in 48 hours to assess if there is patient improvement.   Exam findings, diagnosis etiology and medication use and indications reviewed with patient. Follow- Up and discharge instructions provided. No emergent/urgent issues found on exam.  Patient verbalized understanding of information provided and agrees with plan of care (POC), all questions answered.  1. Sinusitis, unspecified chronicity, unspecified location - doxycycline (VIBRA-TABS) 100 MG tablet; Take 1 tablet (100 mg total) by mouth  2 (two) times daily.  2. Bronchitis - predniSONE (DELTASONE) 20 MG tablet; Take 2 tablets (40 mg total) by mouth daily with breakfast for 5 days.  3.  Wheezing - predniSONE (DELTASONE) 20 MG tablet; Take 2 tablets (40 mg total) by mouth daily with breakfast for 5 days.  4. Cough - montelukast (SINGULAIR) 10 MG tablet; Take 1 tablet (10 mg total) by mouth at bedtime.  Other orders - DYMISTA 137-50 MCG/ACT SUSP; Place 1 spray into both nostrils 2 (two) times daily.

## 2017-06-09 DIAGNOSIS — J3089 Other allergic rhinitis: Secondary | ICD-10-CM | POA: Diagnosis not present

## 2017-06-09 DIAGNOSIS — J301 Allergic rhinitis due to pollen: Secondary | ICD-10-CM | POA: Diagnosis not present

## 2017-06-10 ENCOUNTER — Ambulatory Visit: Payer: 59 | Admitting: Family Medicine

## 2017-06-10 ENCOUNTER — Encounter: Payer: Self-pay | Admitting: Family Medicine

## 2017-06-10 VITALS — BP 138/84 | HR 85 | Temp 97.6°F | Ht 61.0 in | Wt 174.6 lb

## 2017-06-10 DIAGNOSIS — E039 Hypothyroidism, unspecified: Secondary | ICD-10-CM | POA: Diagnosis not present

## 2017-06-10 DIAGNOSIS — J209 Acute bronchitis, unspecified: Secondary | ICD-10-CM | POA: Diagnosis not present

## 2017-06-10 DIAGNOSIS — E1165 Type 2 diabetes mellitus with hyperglycemia: Secondary | ICD-10-CM | POA: Diagnosis not present

## 2017-06-10 DIAGNOSIS — E7800 Pure hypercholesterolemia, unspecified: Secondary | ICD-10-CM | POA: Diagnosis not present

## 2017-06-10 MED ORDER — MOXIFLOXACIN HCL 400 MG PO TABS: 400.0000 mg | ORAL_TABLET | Freq: Every day | ORAL | 0 refills | Status: DC

## 2017-06-10 MED FILL — MOXIFLOXACIN HCL 400 MG TAB: 400 | 10 days supply | Qty: 10 | Fill #0

## 2017-06-10 NOTE — Progress Notes (Signed)
   Subjective:    Patient ID: Alice Jones, female    DOB: 15-Aug-1960, 57 y.o.   MRN: [id number]  HPI She is here for consultation concerning continued difficulty with cough and congestion.  She started having a cough approximately 1 week ago on Wednesday.  She was seen Saturday in an urgent care center and given steroids as well as doxycycline however she continues have cough and congestion but no fever, chills, sore throat or earache.  She has a previous history of difficulty with this and did respond nicely to Avelox.   Review of Systems     Objective:   Physical Exam Alert and in no distress. Tympanic membranes and canals are normal. Pharyngeal area is normal. Neck is supple without adenopathy or thyromegaly. Cardiac exam shows a regular sinus rhythm without murmurs or gallops. Lungs are clear to auscultation.       Assessment & Plan:  Acute bronchitis, unspecified organism - Plan: moxifloxacin (AVELOX) 400 MG tablet  I will switch her to Avelox which has worked well in the past.  She will continue to finish out the [employer].

## 2017-06-14 ENCOUNTER — Encounter: Payer: Self-pay | Admitting: Family Medicine

## 2017-06-16 MED FILL — FREESTYLE LIBRE 14 DAY SENS: 28 days supply | Qty: 2 | Fill #4

## 2017-06-17 DIAGNOSIS — J3089 Other allergic rhinitis: Secondary | ICD-10-CM | POA: Diagnosis not present

## 2017-06-17 DIAGNOSIS — J301 Allergic rhinitis due to pollen: Secondary | ICD-10-CM | POA: Diagnosis not present

## 2017-06-18 DIAGNOSIS — E039 Hypothyroidism, unspecified: Secondary | ICD-10-CM | POA: Diagnosis not present

## 2017-06-18 DIAGNOSIS — E1165 Type 2 diabetes mellitus with hyperglycemia: Secondary | ICD-10-CM | POA: Diagnosis not present

## 2017-06-18 DIAGNOSIS — E7800 Pure hypercholesterolemia, unspecified: Secondary | ICD-10-CM | POA: Diagnosis not present

## 2017-06-18 MED FILL — JANUVIA 50 MG TABLET: 50 | 90 days supply | Qty: 90 | Fill #0

## 2017-06-18 MED FILL — LEVOTHYROXINE 150 MCG TAB: 150 | 90 days supply | Qty: 90 | Fill #0

## 2017-07-01 DIAGNOSIS — J301 Allergic rhinitis due to pollen: Secondary | ICD-10-CM | POA: Diagnosis not present

## 2017-07-01 DIAGNOSIS — J3089 Other allergic rhinitis: Secondary | ICD-10-CM | POA: Diagnosis not present

## 2017-07-02 DIAGNOSIS — J301 Allergic rhinitis due to pollen: Secondary | ICD-10-CM | POA: Diagnosis not present

## 2017-07-05 DIAGNOSIS — J3089 Other allergic rhinitis: Secondary | ICD-10-CM | POA: Diagnosis not present

## 2017-07-07 DIAGNOSIS — J301 Allergic rhinitis due to pollen: Secondary | ICD-10-CM | POA: Diagnosis not present

## 2017-07-07 DIAGNOSIS — J3089 Other allergic rhinitis: Secondary | ICD-10-CM | POA: Diagnosis not present

## 2017-07-12 MED FILL — FREESTYLE LIBRE 14 DAY SENS: 28 days supply | Qty: 2 | Fill #5

## 2017-07-20 DIAGNOSIS — J3089 Other allergic rhinitis: Secondary | ICD-10-CM | POA: Diagnosis not present

## 2017-07-20 DIAGNOSIS — J301 Allergic rhinitis due to pollen: Secondary | ICD-10-CM | POA: Diagnosis not present

## 2017-08-11 MED FILL — AZELASTINE HCL 137 MCG SPRY: 0.1 | 30 days supply | Qty: 30 | Fill #0

## 2017-08-11 MED FILL — FLUTICASONE PROP 50 MCG SPR: 50 | 30 days supply | Qty: 16 | Fill #0

## 2017-08-11 MED FILL — BYDUREON 2 MG PEN INJECT: 2 | 84 days supply | Qty: 12 | Fill #4

## 2017-08-12 MED FILL — UNIFINE PENTIPS 31GX3/16: 31G X 5 MM | 90 days supply | Qty: 100 | Fill #2

## 2017-08-12 MED FILL — TRESIBA FLEXTOUCH 100 UNITS: 100 | 90 days supply | Qty: 6 | Fill #1

## 2017-08-12 MED FILL — FREESTYLE LIBRE 14 DAY SENS: 28 days supply | Qty: 2 | Fill #6

## 2017-08-12 MED FILL — UNIFINE PENTIPS 31GX3/16": 31G X 5 MM | 90 days supply | Qty: 100 | Fill #2

## 2017-08-13 MED FILL — ARMODAFINIL 250 MG TABLET: 250 | 90 days supply | Qty: 90 | Fill #1

## 2017-08-19 DIAGNOSIS — J301 Allergic rhinitis due to pollen: Secondary | ICD-10-CM | POA: Diagnosis not present

## 2017-08-19 DIAGNOSIS — J3089 Other allergic rhinitis: Secondary | ICD-10-CM | POA: Diagnosis not present

## 2017-08-26 DIAGNOSIS — J3089 Other allergic rhinitis: Secondary | ICD-10-CM | POA: Diagnosis not present

## 2017-08-26 DIAGNOSIS — J301 Allergic rhinitis due to pollen: Secondary | ICD-10-CM | POA: Diagnosis not present

## 2017-09-02 ENCOUNTER — Ambulatory Visit (INDEPENDENT_AMBULATORY_CARE_PROVIDER_SITE_OTHER): Payer: Self-pay | Admitting: Nurse Practitioner

## 2017-09-02 VITALS — BP 124/72 | HR 62 | Temp 98.5°F | Resp 20 | Wt 180.0 lb

## 2017-09-02 DIAGNOSIS — Z789 Other specified health status: Secondary | ICD-10-CM

## 2017-09-02 DIAGNOSIS — N390 Urinary tract infection, site not specified: Secondary | ICD-10-CM

## 2017-09-02 LAB — POCT URINALYSIS DIPSTICK
BILIRUBIN UA: NEGATIVE
Glucose, UA: NEGATIVE
KETONES UA: NEGATIVE
Nitrite, UA: NEGATIVE
Protein, UA: NEGATIVE
Urobilinogen, UA: 0.2 E.U./dL
pH, UA: 5 (ref 5.0–8.0)

## 2017-09-02 MED ORDER — NITROFURANTOIN MONOHYD MACRO 100 MG PO CAPS: 100.0000 mg | ORAL_CAPSULE | Freq: Two times a day (BID) | ORAL | 0 refills | Status: AC

## 2017-09-02 NOTE — Patient Instructions (Addendum)
Urinary Tract Infection, Adult  -Take Nitrofurantoin as prescribed. -Use OTC Azo for urinary discomfort. -Ibuprofen or Tylenol for pain, fever or general discomfort. -Increase fluids. Avoid caffeine, teas, soda and coffee until symptoms improve. -Follow up if symptoms do not improve or see PCP.    A urinary tract infection (UTI) is an infection of any part of the urinary tract, which includes the kidneys, ureters, bladder, and urethra. These organs make, store, and get rid of urine in the body. UTI can be a bladder infection (cystitis) or kidney infection (pyelonephritis). What are the causes? This infection may be caused by fungi, viruses, or bacteria. Bacteria are the most common cause of UTIs. This condition can also be caused by repeated incomplete emptying of the bladder during urination. What increases the risk? This condition is more likely to develop if:  You ignore your need to urinate or hold urine for long periods of time.  You do not empty your bladder completely during urination.  You wipe back to front after urinating or having a bowel movement, if you are female.  You are uncircumcised, if you are female.  You are constipated.  You have a urinary catheter that stays in place (indwelling).  You have a weak defense (immune) system.  You have a medical condition that affects your bowels, kidneys, or bladder.  You have diabetes.  You take antibiotic medicines frequently or for long periods of time, and the antibiotics no longer work well against certain types of infections (antibiotic resistance).  You take medicines that irritate your urinary tract.  You are exposed to chemicals that irritate your urinary tract.  You are female.  What are the signs or symptoms? Symptoms of this condition include:  Fever.  Frequent urination or passing small amounts of urine frequently.  Needing to urinate urgently.  Pain or burning with urination.  Urine that smells bad  or unusual.  Cloudy urine.  Pain in the lower abdomen or back.  Trouble urinating.  Blood in the urine.  Vomiting or being less hungry than normal.  Diarrhea or abdominal pain.  Vaginal discharge, if you are female.  How is this diagnosed? This condition is diagnosed with a medical history and physical exam. You will also need to provide a urine sample to test your urine. Other tests may be done, including:  Blood tests.  Sexually transmitted disease (STD) testing.  If you have had more than one UTI, a cystoscopy or imaging studies may be done to determine the cause of the infections. How is this treated? Treatment for this condition often includes a combination of two or more of the following:  Antibiotic medicine.  Other medicines to treat less common causes of UTI.  Over-the-counter medicines to treat pain.  Drinking enough water to stay hydrated.  Follow these instructions at home:  Take over-the-counter and prescription medicines only as told by your health care provider.  If you were prescribed an antibiotic, take it as told by your health care provider. Do not stop taking the antibiotic even if you start to feel better.  Avoid alcohol, caffeine, tea, and carbonated beverages. They can irritate your bladder.  Drink enough fluid to keep your urine clear or pale yellow.  Keep all follow-up visits as told by your health care provider. This is important.  Make sure to: ? Empty your bladder often and completely. Do not hold urine for long periods of time. ? Empty your bladder before and after sex. ? Wipe from front to  back after a bowel movement if you are female. Use each tissue one time when you wipe. Contact a health care provider if:  You have back pain.  You have a fever.  You feel nauseous or vomit.  Your symptoms do not get better after 3 days.  Your symptoms go away and then return. Get help right away if:  You have severe back pain or lower  abdominal pain.  You are vomiting and cannot keep down any medicines or water. This information is not intended to replace advice given to you by your health care provider. Make sure you discuss any questions you have with your health care provider. Document Released: 10/15/2004 Document Revised: 06/19/2015 Document Reviewed: 11/26/2014 Elsevier Interactive Patient Education  [employer].

## 2017-09-02 NOTE — Progress Notes (Signed)
Subjective:    Alice Jones is a 57 y.o. female who complains of burning with urination, frequency and urgency for 2 days.  Patient also complains of vaginal discharge. Patient denies back pain, congestion, fever, headache, sorethroat and stomach ache.  Patient does not have a history of recurrent UTI.  Patient does not have a history of pyelonephritis.   The following portions of the patient's history were reviewed and updated as appropriate: allergies, current medications and past medical history. Review of Systems Constitutional: negative Eyes: negative Ears, nose, mouth, throat, and face: negative Respiratory: negative Cardiovascular: negative Gastrointestinal: negative Genitourinary:positive for vaginal discharge, dysuria and frequency, negative for decreased stream, hematuria, nocturia and urinary incontinence    Objective:    BP 124/72 (BP Location: Right Arm, Patient Position: Sitting, Cuff Size: Normal)   Pulse 62   Temp 98.5 F (36.9 C) (Oral)   Resp 20   Wt 180 lb (81.6 kg)   SpO2 97%   BMI 34.01 kg/m  General: alert, cooperative and no distress  Abdomen: soft, non-tender, without masses or organomegaly in the lower abdomen  Back: back muscles are full ROM, CVA tenderness absent  GU: defer exam   Laboratory:  Urine dipstick shows sp gravity 1.025, negative for glucose, trace for hemoglobin, negative for ketones, positive for leukocyte esterase, negative for nitrites, negative for protein and 0.2 for urobilinogen.   Micro exam: not done.    Assessment:    Acute cystitis    Plan: Plan:   Exam findings, diagnosis etiology and medication use and indications reviewed with patient. Follow- Up and discharge instructions provided. No emergent/urgent issues found on exam.  Patient verbalized understanding of information provided and agrees with plan of care (POC), all questions answered.  1. Urinary tract infection without hematuria, site unspecified  -  nitrofurantoin, macrocrystal-monohydrate, (MACROBID) 100 MG capsule; Take 1 capsule (100 mg total) by mouth 2 (two) times daily for 5 days.  Dispense: 10 capsule; Refill: 0 -Take Nitrofurantoin as prescribed. -Use OTC Azo for urinary discomfort. -Ibuprofen or Tylenol for pain, fever or general discomfort. -Increase fluids. Avoid caffeine, teas, soda and coffee until symptoms improve. -Follow up if symptoms do not improve or see PCP

## 2017-09-06 MED FILL — FREESTYLE LIBRE 14 DAY SENS: 28 days supply | Qty: 2 | Fill #0

## 2017-09-06 MED FILL — LEVOTHYROXINE 150 MCG TAB: 150 | 90 days supply | Qty: 90 | Fill #1

## 2017-09-06 MED FILL — JANUVIA 50 MG TABLET: 50 | 90 days supply | Qty: 90 | Fill #1

## 2017-09-08 DIAGNOSIS — J3089 Other allergic rhinitis: Secondary | ICD-10-CM | POA: Diagnosis not present

## 2017-09-08 DIAGNOSIS — J301 Allergic rhinitis due to pollen: Secondary | ICD-10-CM | POA: Diagnosis not present

## 2017-09-17 DIAGNOSIS — J3089 Other allergic rhinitis: Secondary | ICD-10-CM | POA: Diagnosis not present

## 2017-09-17 DIAGNOSIS — J301 Allergic rhinitis due to pollen: Secondary | ICD-10-CM | POA: Diagnosis not present

## 2017-09-30 MED FILL — FREESTYLE LIBRE 14 DAY SENS: 28 days supply | Qty: 2 | Fill #1

## 2017-10-01 DIAGNOSIS — J3089 Other allergic rhinitis: Secondary | ICD-10-CM | POA: Diagnosis not present

## 2017-10-01 DIAGNOSIS — J301 Allergic rhinitis due to pollen: Secondary | ICD-10-CM | POA: Diagnosis not present

## 2017-10-08 DIAGNOSIS — J3089 Other allergic rhinitis: Secondary | ICD-10-CM | POA: Diagnosis not present

## 2017-10-08 DIAGNOSIS — J301 Allergic rhinitis due to pollen: Secondary | ICD-10-CM | POA: Diagnosis not present

## 2017-10-14 DIAGNOSIS — J301 Allergic rhinitis due to pollen: Secondary | ICD-10-CM | POA: Diagnosis not present

## 2017-10-14 DIAGNOSIS — J3089 Other allergic rhinitis: Secondary | ICD-10-CM | POA: Diagnosis not present

## 2017-10-19 DIAGNOSIS — J301 Allergic rhinitis due to pollen: Secondary | ICD-10-CM | POA: Diagnosis not present

## 2017-10-19 DIAGNOSIS — J3089 Other allergic rhinitis: Secondary | ICD-10-CM | POA: Diagnosis not present

## 2017-10-19 MED FILL — FREESTYLE LIBRE 14 DAY SENS: 28 days supply | Qty: 2 | Fill #2

## 2017-11-02 MED FILL — BYDUREON 2 MG PEN INJECT: 2 | 84 days supply | Qty: 12 | Fill #0

## 2017-11-04 MED FILL — TRESIBA FLEXTOUCH 100 UNITS: 100 | 90 days supply | Qty: 6 | Fill #2

## 2017-11-05 DIAGNOSIS — J301 Allergic rhinitis due to pollen: Secondary | ICD-10-CM | POA: Diagnosis not present

## 2017-11-05 DIAGNOSIS — J3089 Other allergic rhinitis: Secondary | ICD-10-CM | POA: Diagnosis not present

## 2017-11-15 MED FILL — FREESTYLE LIBRE 14 DAY SENS: 28 days supply | Qty: 2 | Fill #3

## 2017-11-16 DIAGNOSIS — J3089 Other allergic rhinitis: Secondary | ICD-10-CM | POA: Diagnosis not present

## 2017-11-16 DIAGNOSIS — J301 Allergic rhinitis due to pollen: Secondary | ICD-10-CM | POA: Diagnosis not present

## 2017-11-17 MED FILL — JANUVIA 50 MG TABLET: 50 | 90 days supply | Qty: 90 | Fill #2

## 2017-11-18 ENCOUNTER — Other Ambulatory Visit: Payer: Self-pay | Admitting: Family Medicine

## 2017-11-18 DIAGNOSIS — G47419 Narcolepsy without cataplexy: Secondary | ICD-10-CM

## 2017-11-18 MED FILL — ARMODAFINIL 250 MG TABLET: 250 | 90 days supply | Qty: 90 | Fill #0

## 2017-11-18 NOTE — Telephone Encounter (Signed)
[facility] is requesting to fill pt  Armodafinil. Please advise [facility] 

## 2017-12-07 MED FILL — UNIFINE PENTIPS 31GX3/16: 31G X 5 MM | 90 days supply | Qty: 100 | Fill #3

## 2017-12-07 MED FILL — UNIFINE PENTIPS 31GX3/16": 31G X 5 MM | 90 days supply | Qty: 100 | Fill #3

## 2017-12-07 MED FILL — LEVOTHYROXINE 150 MCG TAB: 150 | 90 days supply | Qty: 90 | Fill #2

## 2017-12-08 DIAGNOSIS — J3089 Other allergic rhinitis: Secondary | ICD-10-CM | POA: Diagnosis not present

## 2017-12-08 DIAGNOSIS — J301 Allergic rhinitis due to pollen: Secondary | ICD-10-CM | POA: Diagnosis not present

## 2017-12-14 DIAGNOSIS — J3089 Other allergic rhinitis: Secondary | ICD-10-CM | POA: Diagnosis not present

## 2017-12-14 DIAGNOSIS — J301 Allergic rhinitis due to pollen: Secondary | ICD-10-CM | POA: Diagnosis not present

## 2017-12-15 DIAGNOSIS — E1165 Type 2 diabetes mellitus with hyperglycemia: Secondary | ICD-10-CM | POA: Diagnosis not present

## 2017-12-15 DIAGNOSIS — E7800 Pure hypercholesterolemia, unspecified: Secondary | ICD-10-CM | POA: Diagnosis not present

## 2017-12-15 MED FILL — FREESTYLE LIBRE 14 DAY SENS: 28 days supply | Qty: 2 | Fill #4

## 2017-12-25 IMAGING — NM NM MISC PROCEDURE
6 series · 36 of 36 positions shown · non-contrast
Comparison: none

[Series 1: wbr rest · 6.40mm/px · 6 of 64 frames shown]
[frame 6/64]
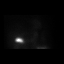
[frame 16/64]
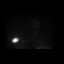
[frame 27/64]
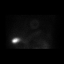
[frame 38/64]
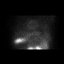
[frame 48/64]
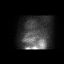
[frame 59/64]
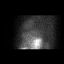

[Series 1: wbr_r-proj_st wbr rest · 6.40mm/px · 6 of 64 frames shown]
[frame 6/64]
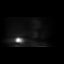
[frame 16/64]
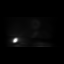
[frame 27/64]
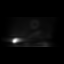
[frame 38/64]
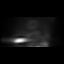
[frame 48/64]
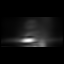
[frame 59/64]
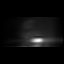

[Series 2: wbr_s-proj_st wbr stress-gsp · 6.40mm/px · 6 of 512 frames shown]
[frame 43/512]
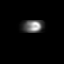
[frame 128/512]
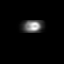
[frame 214/512]
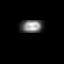
[frame 299/512]
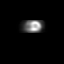
[frame 384/512]
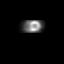
[frame 470/512]
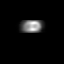

[Series 2: wbr stress-gsp · 6.40mm/px · 6 of 512 frames shown]
[frame 43/512]
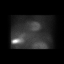
[frame 128/512]
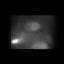
[frame 214/512]
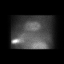
[frame 299/512]
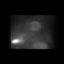
[frame 384/512]
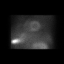
[frame 470/512]
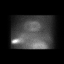

[Series 3: wbr_s-proj_st wbr stress-sum-em · 6.40mm/px · 6 of 64 frames shown]
[frame 6/64]
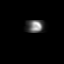
[frame 16/64]
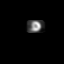
[frame 27/64]
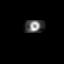
[frame 38/64]
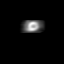
[frame 48/64]
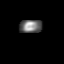
[frame 59/64]
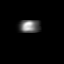

[Series 3: wbr stress-sum-em · 6.40mm/px · 6 of 64 frames shown]
[frame 6/64]
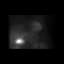
[frame 16/64]
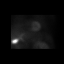
[frame 27/64]
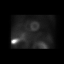
[frame 38/64]
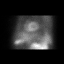
[frame 48/64]
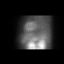
[frame 59/64]
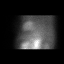

[36 of 36 positions shown; findings below may reference images not displayed]

Canned report from images found in remote index.

Refer to host system for actual result text.

## 2017-12-30 DIAGNOSIS — J3089 Other allergic rhinitis: Secondary | ICD-10-CM | POA: Diagnosis not present

## 2017-12-30 DIAGNOSIS — J301 Allergic rhinitis due to pollen: Secondary | ICD-10-CM | POA: Diagnosis not present

## 2018-01-07 DIAGNOSIS — H40033 Anatomical narrow angle, bilateral: Secondary | ICD-10-CM | POA: Diagnosis not present

## 2018-01-07 DIAGNOSIS — J3089 Other allergic rhinitis: Secondary | ICD-10-CM | POA: Diagnosis not present

## 2018-01-07 DIAGNOSIS — J301 Allergic rhinitis due to pollen: Secondary | ICD-10-CM | POA: Diagnosis not present

## 2018-01-07 DIAGNOSIS — H11823 Conjunctivochalasis, bilateral: Secondary | ICD-10-CM | POA: Diagnosis not present

## 2018-01-07 DIAGNOSIS — E119 Type 2 diabetes mellitus without complications: Secondary | ICD-10-CM | POA: Diagnosis not present

## 2018-01-07 LAB — HM DIABETES EYE EXAM

## 2018-01-07 MED FILL — FREESTYLE LIBRE 14 DAY SENS: 28 days supply | Qty: 2 | Fill #5

## 2018-01-13 ENCOUNTER — Ambulatory Visit (INDEPENDENT_AMBULATORY_CARE_PROVIDER_SITE_OTHER): Payer: Self-pay | Admitting: Physician Assistant

## 2018-01-13 ENCOUNTER — Telehealth: Payer: Self-pay

## 2018-01-13 VITALS — BP 115/80 | HR 90 | Temp 98.2°F | Resp 16 | Wt 181.4 lb

## 2018-01-13 DIAGNOSIS — B379 Candidiasis, unspecified: Secondary | ICD-10-CM

## 2018-01-13 DIAGNOSIS — R300 Dysuria: Secondary | ICD-10-CM

## 2018-01-13 DIAGNOSIS — T3695XA Adverse effect of unspecified systemic antibiotic, initial encounter: Secondary | ICD-10-CM

## 2018-01-13 LAB — POCT URINALYSIS DIPSTICK
Bilirubin, UA: NEGATIVE
Glucose, UA: NEGATIVE
Ketones, UA: NEGATIVE
Nitrite, UA: NEGATIVE
Protein, UA: NEGATIVE
Spec Grav, UA: 1.015
Urobilinogen, UA: 0.2 U/dL
pH, UA: 5

## 2018-01-13 MED ORDER — NITROFURANTOIN MONOHYD MACRO 100 MG PO CAPS: 100.0000 mg | ORAL_CAPSULE | Freq: Two times a day (BID) | ORAL | 0 refills | Status: AC

## 2018-01-13 MED ORDER — FLUCONAZOLE 150 MG PO TABS: 150.0000 mg | ORAL_TABLET | Freq: Once | ORAL | 0 refills | Status: AC

## 2018-01-13 MED FILL — NITROFURANTOIN MONO-MCR 100: 100 | 5 days supply | Qty: 10 | Fill #0

## 2018-01-13 MED FILL — FLUCONAZOLE 150 MG TABS: 150 | 2 days supply | Qty: 2 | Fill #0

## 2018-01-13 NOTE — Patient Instructions (Addendum)
Your results indicate you have a UTI. I have given you a prescription for an antibiotic. Please take with food. We do not obtain urine cultures at our office; therefore, if your symptoms worsen over the next 24-48 hours or you develop fever, chills, flank pian, nausea and vomiting, please seek care immediately. If you have a recurrent UTI, you will need to follow with your family doctor or endocrinologist.   Urinary Tract Infection, Adult A urinary tract infection (UTI) is an infection of any part of the urinary tract. The urinary tract includes:  The kidneys.  The ureters.  The bladder.  The urethra. These organs make, store, and get rid of pee (urine) in the body. What are the causes? This is caused by germs (bacteria) in your genital area. These germs grow and cause swelling (inflammation) of your urinary tract. What increases the risk? You are more likely to develop this condition if:  You have a small, thin tube (catheter) to drain pee.  You cannot control when you pee or poop (incontinence).  You are female, and: ? You use these methods to prevent pregnancy: ? A medicine that kills sperm (spermicide). ? A device that blocks sperm (diaphragm). ? You have low levels of a female hormone (estrogen). ? You are pregnant.  You have genes that add to your risk.  You are sexually active.  You take antibiotic medicines.  You have trouble peeing because of: ? A prostate that is bigger than normal, if you are female. ? A blockage in the part of your body that drains pee from the bladder (urethra). ? A kidney stone. ? A nerve condition that affects your bladder (neurogenic bladder). ? Not getting enough to drink. ? Not peeing often enough.  You have other conditions, such as: ? Diabetes. ? A weak disease-fighting system (immune system). ? Sickle cell disease. ? Gout. ? Injury of the spine. What are the signs or symptoms? Symptoms of this condition include:  Needing to pee  right away (urgently).  Peeing often.  Peeing small amounts often.  Pain or burning when peeing.  Blood in the pee.  Pee that smells bad or not like normal.  Trouble peeing.  Pee that is cloudy.  Fluid coming from the vagina, if you are female.  Pain in the belly or lower back. Other symptoms include:  Throwing up (vomiting).  No urge to eat.  Feeling mixed up (confused).  Being tired and grouchy (irritable).  A fever.  Watery poop (diarrhea). How is this treated? This condition may be treated with:  Antibiotic medicine.  Other medicines.  Drinking enough water. Follow these instructions at home:  Medicines  Take over-the-counter and prescription medicines only as told by your doctor.  If you were prescribed an antibiotic medicine, take it as told by your doctor. Do not stop taking it even if you start to feel better. General instructions  Make sure you: ? Pee until your bladder is empty. ? Do not hold pee for a long time. ? Empty your bladder after sex. ? Wipe from front to back after pooping if you are a female. Use each tissue one time when you wipe.  Drink enough fluid to keep your pee pale yellow.  Keep all follow-up visits as told by your doctor. This is important. Contact a doctor if:  You do not get better after 1-2 days.  Your symptoms go away and then come back. Get help right away if:  You have very bad back  pain.  You have very bad pain in your lower belly.  You have a fever.  You are sick to your stomach (nauseous).  You are throwing up. Summary  A urinary tract infection (UTI) is an infection of any part of the urinary tract.  This condition is caused by germs in your genital area.  There are many risk factors for a UTI. These include having a small, thin tube to drain pee and not being able to control when you pee or poop.  Treatment includes antibiotic medicines for germs.  Drink enough fluid to keep your pee pale  yellow. This information is not intended to replace advice given to you by your health care provider. Make sure you discuss any questions you have with your health care provider. Document Released: 06/24/2007 Document Revised: 07/15/2017 Document Reviewed: 07/15/2017 Elsevier Interactive Patient Education  2019 [employer].

## 2018-01-13 NOTE — Progress Notes (Signed)
01/13/2018 at 10:27 AM  Alice Jones / DOB: Oct 14, 1960 / MRN: [id number]  The patient has Allergic rhinitis; Diabetes ([place]); Hyperlipemia; Herniated cervical disc; Glaucoma, narrow-angle; Dizziness; Vestibular neuronitis; Obesity (BMI 30.0-34.9); Sleep related choking sensation; Snoring; Narcolepsy without cataplexy; Nocturnal hypoxia; Other seasonal allergic rhinitis; Controlled hypersomnia; and Need for vaccination against Streptococcus pneumoniae on their problem list.  SUBJECTIVE  Alice Jones is a 57 y.o. female who complains of dysuria, urinary frequency and urinary urgency x 3 days. She denies hematuria, flank pain, abdominal pain, pelvic pain, genital rash, genital irritation and vaginal discharge. Has tried azo and water w/ lemon with some relief. Most recent UTI prior to this was 08/2017, seen here at [facility], tx with macrobid, sx completely resolved. Notes prior to 08/2017, she has not had consistent UTIs. Only change she can think of is that she started [place] in August. She is following with endocrinology regularly, just had an appointment and A1C was ~6.7. She is sexually active with females. No concern for STDs today.    She  has a past medical history of Allergic rhinitis, Carpal tunnel syndrome, Complication of anesthesia, Diabetes mellitus, Herniated cervical disc, Hyperlipemia, Hypothyroid, Narcolepsy, and Thyroid disease.    Medications reviewed and updated by myself where necessary, and exist elsewhere in the encounter.   Ms. Falter is allergic to liraglutide. She  reports that she has never smoked. She has never used smokeless tobacco. She reports current alcohol use. She reports that she does not use drugs. She  reports being sexually active and has had partner(s) who are Female. The patient  has a past surgical history that includes Shoulder surgery; Appendectomy (09/2009); Cervical fusion (12/09, 12/10); Refractive surgery (Left, 09/2013); Carpal tunnel release  (Right, 11/03/2015); Tennis elbow release/nirschel procedure (Right, 01/10/2016); MM BREAST STEREO BX*R*R/S (Right); Breast biopsy (Right); Reduction mammaplasty; and Upper gi endoscopy (10/28/2016).  Her family history includes Cancer in her mother; Diabetes in her maternal grandfather.  Review of Systems  Constitutional: Negative for chills and fever.  Gastrointestinal: Negative for nausea and vomiting.    OBJECTIVE  Her  weight is 181 lb 6.4 oz (82.3 kg). Her oral temperature is 98.2 F (36.8 C). Her blood pressure is 115/80 and her pulse is 90. Her respiration is 16 and oxygen saturation is 96%.  The patient's body mass index is 34.28 kg/m.  Physical Exam Vitals signs reviewed.  Constitutional:      General: She is not in acute distress.    Appearance: Normal appearance. She is well-developed.  HENT:     Head: Normocephalic and atraumatic.  Eyes:     Conjunctiva/sclera: Conjunctivae normal.  Neck:     Musculoskeletal: Normal range of motion.  Pulmonary:     Effort: Pulmonary effort is normal.  Abdominal:     Palpations: Abdomen is soft.     Tenderness: There is no abdominal tenderness. There is no right CVA tenderness or left CVA tenderness.  Skin:    General: Skin is warm and dry.  Neurological:     Mental Status: She is alert and oriented to person, place, and time.     Results for orders placed or performed in visit on 01/13/18 (from the past 24 hour(s))  POCT urinalysis dipstick     Status: Abnormal   Collection Time: 01/13/18 10:03 AM  Result Value Ref Range   Color, UA Orange    Clarity, UA clear    Glucose, UA Negative Negative   Bilirubin, UA negative    Ketones,  UA negative    Spec Grav, UA 1.015 1.010 - 1.025   Blood, UA moderate    pH, UA 5.0 5.0 - 8.0   Protein, UA Negative Negative   Urobilinogen, UA 0.2 0.2 or 1.0 E.U./dL   Nitrite, UA negative    Leukocytes, UA Moderate (2+) (A) Negative   Appearance     Odor      ASSESSMENT & PLAN  [name]  was seen today for dysuria.  Diagnoses and all orders for this visit:  Dysuria -     POCT urinalysis dipstick  Other orders -     nitrofurantoin, macrocrystal-monohydrate, (MACROBID) 100 MG capsule; Take 1 capsule (100 mg total) by mouth 2 (two) times daily for 5 days. -     fluconazole (DIFLUCAN) 150 MG tablet; Take 1 tablet (150 mg total) by mouth once for 1 dose. Repeat if needed    Hx and UA dip suggestive of UTI. Will treat empirically at this time. Explained to pt that we do not obtain urine cx in our office. She has now had 2 UTIs in the past 4 months. Due to this and her comorbidities, would recommend she f/u with PCP for future recurrences of urinary sx for more comprehensive evaluation or sooner if no improvement/sx worsen with current tx plan. Pt voices understanding.   Tenna Delaine, Hershal Coria  [facility] 01/13/2018 10:27 AM

## 2018-01-28 DIAGNOSIS — J301 Allergic rhinitis due to pollen: Secondary | ICD-10-CM | POA: Diagnosis not present

## 2018-01-28 DIAGNOSIS — J3089 Other allergic rhinitis: Secondary | ICD-10-CM | POA: Diagnosis not present

## 2018-02-01 ENCOUNTER — Other Ambulatory Visit: Payer: Self-pay | Admitting: Family Medicine

## 2018-02-01 DIAGNOSIS — G47419 Narcolepsy without cataplexy: Secondary | ICD-10-CM

## 2018-02-01 MED FILL — FREESTYLE LIBRE 14 DAY SENS: 28 days supply | Qty: 2 | Fill #6

## 2018-02-01 MED FILL — BYDUREON 2 MG PEN INJECT: 2 | 84 days supply | Qty: 12 | Fill #1

## 2018-02-01 NOTE — Telephone Encounter (Signed)
[facility] is requesting to fill pt armodafinil . Please advise [facility] 

## 2018-02-07 NOTE — Telephone Encounter (Signed)
PHONECALL  

## 2018-02-15 DIAGNOSIS — E039 Hypothyroidism, unspecified: Secondary | ICD-10-CM | POA: Diagnosis not present

## 2018-02-15 DIAGNOSIS — E1165 Type 2 diabetes mellitus with hyperglycemia: Secondary | ICD-10-CM | POA: Diagnosis not present

## 2018-02-15 DIAGNOSIS — E7800 Pure hypercholesterolemia, unspecified: Secondary | ICD-10-CM | POA: Diagnosis not present

## 2018-02-15 MED FILL — ARMODAFINIL 250 MG TABLET: 250 | 90 days supply | Qty: 90 | Fill #0

## 2018-02-25 DIAGNOSIS — J3089 Other allergic rhinitis: Secondary | ICD-10-CM | POA: Diagnosis not present

## 2018-02-25 DIAGNOSIS — J301 Allergic rhinitis due to pollen: Secondary | ICD-10-CM | POA: Diagnosis not present

## 2018-03-03 DIAGNOSIS — J301 Allergic rhinitis due to pollen: Secondary | ICD-10-CM | POA: Diagnosis not present

## 2018-03-03 DIAGNOSIS — J3089 Other allergic rhinitis: Secondary | ICD-10-CM | POA: Diagnosis not present

## 2018-03-04 DIAGNOSIS — E11649 Type 2 diabetes mellitus with hypoglycemia without coma: Secondary | ICD-10-CM | POA: Diagnosis not present

## 2018-03-08 DIAGNOSIS — J3089 Other allergic rhinitis: Secondary | ICD-10-CM | POA: Diagnosis not present

## 2018-03-08 DIAGNOSIS — J301 Allergic rhinitis due to pollen: Secondary | ICD-10-CM | POA: Diagnosis not present

## 2018-03-08 MED FILL — HumaLOG 100 UNIT/ML SOLN: 100 | 56 days supply | Qty: 20 | Fill #0 | Status: TO

## 2018-03-16 MED FILL — LEVOTHYROXINE 150 MCG TAB: 150 | 90 days supply | Qty: 90 | Fill #3

## 2018-03-17 DIAGNOSIS — J301 Allergic rhinitis due to pollen: Secondary | ICD-10-CM | POA: Diagnosis not present

## 2018-03-17 DIAGNOSIS — J3089 Other allergic rhinitis: Secondary | ICD-10-CM | POA: Diagnosis not present

## 2018-03-17 MED FILL — EPINEPHRINE 0.3 MG AUTO-INJ: 0.3 | 25 days supply | Qty: 2 | Fill #0

## 2018-03-30 MED FILL — CONTOUR NEXT STRIPS: 30 days supply | Qty: 200 | Fill #0

## 2018-04-01 DIAGNOSIS — J3089 Other allergic rhinitis: Secondary | ICD-10-CM | POA: Diagnosis not present

## 2018-04-01 DIAGNOSIS — J301 Allergic rhinitis due to pollen: Secondary | ICD-10-CM | POA: Diagnosis not present

## 2018-04-01 MED FILL — FLUTICASONE PROP 50 MCG SPR: 50 | 30 days supply | Qty: 16 | Fill #0

## 2018-04-01 MED FILL — AZELASTINE HCL 137 MCG SPRY: 0.1 | 50 days supply | Qty: 30 | Fill #0

## 2018-04-04 DIAGNOSIS — E1165 Type 2 diabetes mellitus with hyperglycemia: Secondary | ICD-10-CM | POA: Diagnosis not present

## 2018-04-04 DIAGNOSIS — Z794 Long term (current) use of insulin: Secondary | ICD-10-CM | POA: Diagnosis not present

## 2018-04-04 MED FILL — DEXCOM G6 SENSOR MISC: 30 days supply | Qty: 3 | Fill #0

## 2018-04-04 MED FILL — DEXCOM G6 TRANSMITTER MISC: 30 days supply | Qty: 1 | Fill #0

## 2018-04-08 DIAGNOSIS — J3089 Other allergic rhinitis: Secondary | ICD-10-CM | POA: Diagnosis not present

## 2018-04-08 DIAGNOSIS — J301 Allergic rhinitis due to pollen: Secondary | ICD-10-CM | POA: Diagnosis not present

## 2018-04-14 ENCOUNTER — Other Ambulatory Visit: Payer: Self-pay | Admitting: Family Medicine

## 2018-04-14 DIAGNOSIS — G47419 Narcolepsy without cataplexy: Secondary | ICD-10-CM

## 2018-04-15 DIAGNOSIS — J301 Allergic rhinitis due to pollen: Secondary | ICD-10-CM | POA: Diagnosis not present

## 2018-04-15 DIAGNOSIS — J3081 Allergic rhinitis due to animal (cat) (dog) hair and dander: Secondary | ICD-10-CM | POA: Diagnosis not present

## 2018-04-15 DIAGNOSIS — J3089 Other allergic rhinitis: Secondary | ICD-10-CM | POA: Diagnosis not present

## 2018-04-19 MED FILL — HumaLOG 100 UNIT/ML SOLN: 100 | 84 days supply | Qty: 30 | Fill #0

## 2018-04-19 MED FILL — BYDUREON 2 MG PEN INJECT: 2 | 84 days supply | Qty: 12 | Fill #0

## 2018-04-22 DIAGNOSIS — J301 Allergic rhinitis due to pollen: Secondary | ICD-10-CM | POA: Diagnosis not present

## 2018-04-22 DIAGNOSIS — J3089 Other allergic rhinitis: Secondary | ICD-10-CM | POA: Diagnosis not present

## 2018-04-28 DIAGNOSIS — J301 Allergic rhinitis due to pollen: Secondary | ICD-10-CM | POA: Diagnosis not present

## 2018-04-28 DIAGNOSIS — J3089 Other allergic rhinitis: Secondary | ICD-10-CM | POA: Diagnosis not present

## 2018-05-04 DIAGNOSIS — J3081 Allergic rhinitis due to animal (cat) (dog) hair and dander: Secondary | ICD-10-CM | POA: Diagnosis not present

## 2018-05-04 DIAGNOSIS — J301 Allergic rhinitis due to pollen: Secondary | ICD-10-CM | POA: Diagnosis not present

## 2018-05-04 DIAGNOSIS — J3089 Other allergic rhinitis: Secondary | ICD-10-CM | POA: Diagnosis not present

## 2018-05-12 ENCOUNTER — Other Ambulatory Visit: Payer: Self-pay | Admitting: Family Medicine

## 2018-05-12 DIAGNOSIS — G47419 Narcolepsy without cataplexy: Secondary | ICD-10-CM

## 2018-05-12 MED FILL — ARMODAFINIL 250 MG TABLET: 250 | 90 days supply | Qty: 90 | Fill #0

## 2018-05-12 NOTE — Telephone Encounter (Signed)
Is this okay to refill? 

## 2018-06-01 DIAGNOSIS — E11649 Type 2 diabetes mellitus with hypoglycemia without coma: Secondary | ICD-10-CM | POA: Diagnosis not present

## 2018-06-02 DIAGNOSIS — J3089 Other allergic rhinitis: Secondary | ICD-10-CM | POA: Diagnosis not present

## 2018-06-02 DIAGNOSIS — J301 Allergic rhinitis due to pollen: Secondary | ICD-10-CM | POA: Diagnosis not present

## 2018-06-10 DIAGNOSIS — E039 Hypothyroidism, unspecified: Secondary | ICD-10-CM | POA: Diagnosis not present

## 2018-06-10 DIAGNOSIS — Z9641 Presence of insulin pump (external) (internal): Secondary | ICD-10-CM | POA: Diagnosis not present

## 2018-06-10 DIAGNOSIS — J301 Allergic rhinitis due to pollen: Secondary | ICD-10-CM | POA: Diagnosis not present

## 2018-06-10 DIAGNOSIS — E1165 Type 2 diabetes mellitus with hyperglycemia: Secondary | ICD-10-CM | POA: Diagnosis not present

## 2018-06-10 DIAGNOSIS — E7800 Pure hypercholesterolemia, unspecified: Secondary | ICD-10-CM | POA: Diagnosis not present

## 2018-06-10 DIAGNOSIS — J3089 Other allergic rhinitis: Secondary | ICD-10-CM | POA: Diagnosis not present

## 2018-06-14 MED FILL — LEVOTHYROXINE 150 MCG TAB: 150 | 90 days supply | Qty: 90 | Fill #4

## 2018-06-24 DIAGNOSIS — J301 Allergic rhinitis due to pollen: Secondary | ICD-10-CM | POA: Diagnosis not present

## 2018-06-24 DIAGNOSIS — J3089 Other allergic rhinitis: Secondary | ICD-10-CM | POA: Diagnosis not present

## 2018-06-28 DIAGNOSIS — Z794 Long term (current) use of insulin: Secondary | ICD-10-CM | POA: Diagnosis not present

## 2018-06-28 DIAGNOSIS — E1165 Type 2 diabetes mellitus with hyperglycemia: Secondary | ICD-10-CM | POA: Diagnosis not present

## 2018-07-01 DIAGNOSIS — J301 Allergic rhinitis due to pollen: Secondary | ICD-10-CM | POA: Diagnosis not present

## 2018-07-01 DIAGNOSIS — J3089 Other allergic rhinitis: Secondary | ICD-10-CM | POA: Diagnosis not present

## 2018-07-04 DIAGNOSIS — J3089 Other allergic rhinitis: Secondary | ICD-10-CM | POA: Diagnosis not present

## 2018-07-12 MED FILL — BYDUREON 2 MG PEN INJECT: 2 | 84 days supply | Qty: 12 | Fill #0

## 2018-07-15 DIAGNOSIS — J3089 Other allergic rhinitis: Secondary | ICD-10-CM | POA: Diagnosis not present

## 2018-07-15 DIAGNOSIS — J301 Allergic rhinitis due to pollen: Secondary | ICD-10-CM | POA: Diagnosis not present

## 2018-07-21 MED FILL — HumaLOG 100 UNIT/ML SOLN: 100 | 80 days supply | Qty: 40 | Fill #0

## 2018-07-29 DIAGNOSIS — J301 Allergic rhinitis due to pollen: Secondary | ICD-10-CM | POA: Diagnosis not present

## 2018-07-29 DIAGNOSIS — J3089 Other allergic rhinitis: Secondary | ICD-10-CM | POA: Diagnosis not present

## 2018-08-08 ENCOUNTER — Other Ambulatory Visit: Payer: Self-pay | Admitting: Family Medicine

## 2018-08-08 DIAGNOSIS — G47419 Narcolepsy without cataplexy: Secondary | ICD-10-CM

## 2018-08-08 NOTE — Telephone Encounter (Signed)
LVM for pt to call and make an appointment for a med check [facility]

## 2018-08-08 NOTE — Telephone Encounter (Signed)
Needs an appt

## 2018-08-08 NOTE — Telephone Encounter (Signed)
Alice Jones is requesting to fill [name] Please advise [facility]

## 2018-08-12 ENCOUNTER — Ambulatory Visit: Payer: 59 | Admitting: Family Medicine

## 2018-08-12 ENCOUNTER — Encounter: Payer: Self-pay | Admitting: Family Medicine

## 2018-08-12 ENCOUNTER — Other Ambulatory Visit: Payer: Self-pay

## 2018-08-12 VITALS — BP 112/74 | HR 72 | Temp 98.4°F | Ht 61.5 in | Wt 175.0 lb

## 2018-08-12 DIAGNOSIS — E039 Hypothyroidism, unspecified: Secondary | ICD-10-CM | POA: Diagnosis not present

## 2018-08-12 DIAGNOSIS — H4020X4 Unspecified primary angle-closure glaucoma, indeterminate stage: Secondary | ICD-10-CM | POA: Diagnosis not present

## 2018-08-12 DIAGNOSIS — E119 Type 2 diabetes mellitus without complications: Secondary | ICD-10-CM | POA: Diagnosis not present

## 2018-08-12 DIAGNOSIS — Z23 Encounter for immunization: Secondary | ICD-10-CM | POA: Diagnosis not present

## 2018-08-12 DIAGNOSIS — J3089 Other allergic rhinitis: Secondary | ICD-10-CM | POA: Diagnosis not present

## 2018-08-12 DIAGNOSIS — G47419 Narcolepsy without cataplexy: Secondary | ICD-10-CM | POA: Diagnosis not present

## 2018-08-12 DIAGNOSIS — H409 Unspecified glaucoma: Secondary | ICD-10-CM

## 2018-08-12 DIAGNOSIS — J309 Allergic rhinitis, unspecified: Secondary | ICD-10-CM | POA: Diagnosis not present

## 2018-08-12 DIAGNOSIS — E785 Hyperlipidemia, unspecified: Secondary | ICD-10-CM

## 2018-08-12 DIAGNOSIS — J301 Allergic rhinitis due to pollen: Secondary | ICD-10-CM | POA: Diagnosis not present

## 2018-08-12 MED ORDER — ARMODAFINIL 250 MG PO TABS: 250.0000 mg | ORAL_TABLET | Freq: Every day | ORAL | 3 refills | Status: DC

## 2018-08-12 MED ORDER — ATORVASTATIN CALCIUM 20 MG PO TABS: 20.0000 mg | ORAL_TABLET | Freq: Every day | ORAL | 3 refills | Status: DC

## 2018-08-12 MED ORDER — LOSARTAN POTASSIUM 25 MG PO TABS: 25.0000 mg | ORAL_TABLET | Freq: Every day | ORAL | 3 refills | Status: DC

## 2018-08-12 MED FILL — LOSARTAN POTASSIUM 25 MG TA: 25 | 30 days supply | Qty: 30 | Fill #0

## 2018-08-12 MED FILL — ARMODAFINIL 250 MG TABLET: 250 | 90 days supply | Qty: 90 | Fill #0

## 2018-08-12 MED FILL — ATORVASTATIN 20 MG TABLET: 20 | 90 days supply | Qty: 90 | Fill #0

## 2018-08-12 NOTE — Progress Notes (Signed)
   Subjective:    Patient ID: Alice Jones, female    DOB: [birth year], 59 y.o.   MRN: [id number]  HPI She is here for medication management visit.  She does have underlying diabetes as well as hypothyroidism and is followed by Dr. Debbora Presto.  She is using a pump.  She states her last A1c was 6.5.  She also has a history of narcolepsy and is doing quite nicely on Motofen all current 50 mg/day.  Her allergies are under good control and she has been getting shots for several years.  She sees Dr. Carolynn Sayers for her glaucoma and general eye check.  Recent blood work did show cholesterol 228, LDL 122, triglycerides 189.  Her immunizations were reviewed.  General medical care indicates she needs a Pap which she will set up in a couple of months.  She will also get a mammogram.   Review of Systems     Objective:   Physical Exam Alert and in no distress otherwise not examined       Assessment & Plan:   Encounter Diagnoses  Name Primary?  . Angle-closure glaucoma, indeterminate stage   . Type 2 diabetes mellitus without complication, without long-term current use of insulin ([place])   . Narcolepsy without cataplexy Yes  . Allergic rhinitis, unspecified seasonality, unspecified trigger   . Need for vaccination against Streptococcus pneumoniae   . Glaucoma, unspecified glaucoma type, unspecified laterality   . Hyperlipidemia, unspecified hyperlipidemia type   . Hypothyroidism, unspecified type   She will continue to be followed by Dr. Michiel Sites.  She will also return here in 2 months for Pap as well as blood work. Modafinil was renewed.

## 2018-08-27 DIAGNOSIS — M542 Cervicalgia: Secondary | ICD-10-CM | POA: Diagnosis not present

## 2018-08-29 DIAGNOSIS — E11649 Type 2 diabetes mellitus with hypoglycemia without coma: Secondary | ICD-10-CM | POA: Diagnosis not present

## 2018-09-06 DIAGNOSIS — M4722 Other spondylosis with radiculopathy, cervical region: Secondary | ICD-10-CM | POA: Diagnosis not present

## 2018-09-06 DIAGNOSIS — M5030 Other cervical disc degeneration, unspecified cervical region: Secondary | ICD-10-CM | POA: Diagnosis not present

## 2018-09-06 DIAGNOSIS — R030 Elevated blood-pressure reading, without diagnosis of hypertension: Secondary | ICD-10-CM | POA: Diagnosis not present

## 2018-09-06 DIAGNOSIS — Z6832 Body mass index (BMI) 32.0-32.9, adult: Secondary | ICD-10-CM | POA: Diagnosis not present

## 2018-09-06 DIAGNOSIS — M5412 Radiculopathy, cervical region: Secondary | ICD-10-CM | POA: Diagnosis not present

## 2018-09-09 MED FILL — LOSARTAN POTASSIUM 25 MG TA: 25 | 30 days supply | Qty: 30 | Fill #1

## 2018-09-09 MED FILL — LEVOTHYROXINE 150 MCG TAB: 150 | 90 days supply | Qty: 90 | Fill #0

## 2018-09-15 DIAGNOSIS — M4722 Other spondylosis with radiculopathy, cervical region: Secondary | ICD-10-CM | POA: Diagnosis not present

## 2018-09-15 DIAGNOSIS — M542 Cervicalgia: Secondary | ICD-10-CM | POA: Diagnosis not present

## 2018-09-19 DIAGNOSIS — M5412 Radiculopathy, cervical region: Secondary | ICD-10-CM | POA: Diagnosis not present

## 2018-09-19 DIAGNOSIS — M4722 Other spondylosis with radiculopathy, cervical region: Secondary | ICD-10-CM | POA: Diagnosis not present

## 2018-09-19 DIAGNOSIS — M4724 Other spondylosis with radiculopathy, thoracic region: Secondary | ICD-10-CM | POA: Diagnosis not present

## 2018-09-19 DIAGNOSIS — M5030 Other cervical disc degeneration, unspecified cervical region: Secondary | ICD-10-CM | POA: Diagnosis not present

## 2018-09-27 MED FILL — MELOXICAM 7.5 MG TABLET: 7.5 | 30 days supply | Qty: 30 | Fill #0

## 2018-10-11 MED FILL — HumaLOG 100 UNIT/ML SOLN: 100 | 80 days supply | Qty: 40 | Fill #1

## 2018-10-11 MED FILL — LOSARTAN POTASSIUM 25 MG TA: 25 | 30 days supply | Qty: 30 | Fill #2

## 2018-10-11 MED FILL — BYDUREON 2 MG PEN INJECT: 2 | 28 days supply | Qty: 4 | Fill #0

## 2018-10-12 MED FILL — TRULICITY 0.75 MG/0.5 ML PE: 0.75 | 28 days supply | Qty: 2 | Fill #0

## 2018-10-18 ENCOUNTER — Telehealth: Payer: Self-pay

## 2018-10-18 NOTE — Telephone Encounter (Signed)
Called pt to screen for appt 10-21-18. Alice Jones

## 2018-10-19 DIAGNOSIS — E1165 Type 2 diabetes mellitus with hyperglycemia: Secondary | ICD-10-CM | POA: Diagnosis not present

## 2018-10-19 DIAGNOSIS — Z794 Long term (current) use of insulin: Secondary | ICD-10-CM | POA: Diagnosis not present

## 2018-10-21 ENCOUNTER — Ambulatory Visit (INDEPENDENT_AMBULATORY_CARE_PROVIDER_SITE_OTHER): Payer: 59 | Admitting: Family Medicine

## 2018-10-21 ENCOUNTER — Other Ambulatory Visit (HOSPITAL_COMMUNITY)
Admission: RE | Admit: 2018-10-21 | Discharge: 2018-10-21 | Disposition: A | Discharge status: Home / Self Care | Payer: 59 | Source: Ambulatory Visit | Attending: Family Medicine | Admitting: Family Medicine

## 2018-10-21 ENCOUNTER — Encounter: Payer: Self-pay | Admitting: Family Medicine

## 2018-10-21 ENCOUNTER — Other Ambulatory Visit: Payer: Self-pay

## 2018-10-21 VITALS — BP 116/72 | HR 71 | Temp 98.0°F | Ht 61.5 in | Wt 175.0 lb

## 2018-10-21 DIAGNOSIS — E66811 Obesity, class 1: Secondary | ICD-10-CM

## 2018-10-21 DIAGNOSIS — I499 Cardiac arrhythmia, unspecified: Secondary | ICD-10-CM | POA: Diagnosis not present

## 2018-10-21 DIAGNOSIS — J309 Allergic rhinitis, unspecified: Secondary | ICD-10-CM

## 2018-10-21 DIAGNOSIS — L501 Idiopathic urticaria: Secondary | ICD-10-CM

## 2018-10-21 DIAGNOSIS — H409 Unspecified glaucoma: Secondary | ICD-10-CM

## 2018-10-21 DIAGNOSIS — E119 Type 2 diabetes mellitus without complications: Secondary | ICD-10-CM | POA: Diagnosis not present

## 2018-10-21 DIAGNOSIS — Z124 Encounter for screening for malignant neoplasm of cervix: Secondary | ICD-10-CM | POA: Diagnosis not present

## 2018-10-21 DIAGNOSIS — E785 Hyperlipidemia, unspecified: Secondary | ICD-10-CM | POA: Diagnosis not present

## 2018-10-21 DIAGNOSIS — Z0000 Encounter for general adult medical examination without abnormal findings: Secondary | ICD-10-CM

## 2018-10-21 DIAGNOSIS — Z23 Encounter for immunization: Secondary | ICD-10-CM | POA: Diagnosis not present

## 2018-10-21 DIAGNOSIS — G47419 Narcolepsy without cataplexy: Secondary | ICD-10-CM

## 2018-10-21 DIAGNOSIS — E039 Hypothyroidism, unspecified: Secondary | ICD-10-CM

## 2018-10-21 DIAGNOSIS — R42 Dizziness and giddiness: Secondary | ICD-10-CM

## 2018-10-21 DIAGNOSIS — E669 Obesity, unspecified: Secondary | ICD-10-CM

## 2018-10-21 DIAGNOSIS — H4020X4 Unspecified primary angle-closure glaucoma, indeterminate stage: Secondary | ICD-10-CM

## 2018-10-21 LAB — POCT UA - MICROALBUMIN
Albumin/Creatinine Ratio, Urine, POC: <14.6
Creatinine, POC: 34.2 mg/dL
Microalbumin Ur, POC: <5 mg/L

## 2018-10-21 LAB — POCT GLYCOSYLATED HEMOGLOBIN (HGB A1C): Hemoglobin A1C: 6.7 % — AB (ref 4.0–5.6)

## 2018-10-21 NOTE — Progress Notes (Signed)
   Subjective:    Patient ID: Alice Jones, female    DOB: [birth year], 58 y.o.   MRN: [id number]  HPI She is here for complete examination.  She has had a couple of episodes of dizziness that have been intermittent in nature.  There is no associated weakness, numbness, tingling, heart rate changes, chest pain.  There is also question of cardiac irregularity that she has noted on her wrist watch but this is not well documented.  She does have a history of cervical disc disease and has had surgery on that.  She most recently saw Dr. Sherwood Gambler for this and apparently no further intervention is needed.  She continues to see endocrinology for her thyroid as well as diabetes related problems.  She also has narcolepsy and is doing quite well on her present medication regimen.  She does have allergic rhinitis and sees Dr. Donneta Romberg for this regularly.  Apparently she has urticaria and uses 1 Benadryl daily which keeps this under control.  She sees Dr. Katy Fitch for her glaucoma. She is married and her home life is going well.  Family and social history as well as health maintenance and immunizations was reviewed. EKG is negative. Review of Systems  All other systems reviewed and are negative.      Objective:   Physical Exam Alert and in no distress. Tympanic membranes and canals are normal. Pharyngeal area is normal. Neck is supple without adenopathy or thyromegaly. Cardiac exam shows a regular sinus rhythm without murmurs or gallops. Lungs are clear to auscultation. Abdominal exam shows no masses or tenderness.  Pelvic exam shows the labia to be normal.  Cervix normal.  No pelvic masses noted.  Pap taken.      Assessment & Plan:  Narcolepsy without cataplexy  Hypothyroidism, unspecified type  Angle-closure glaucoma, indeterminate stage  Type 2 diabetes mellitus without complication, without long-term current use of insulin (HCC) - Plan: POCT glycosylated hemoglobin (Hb A1C), POCT UA - Microalbumin   Allergic rhinitis, unspecified seasonality, unspecified trigger  Glaucoma, unspecified glaucoma type, unspecified laterality  Hyperlipidemia, unspecified hyperlipidemia type  Cardiac arrhythmia, unspecified cardiac arrhythmia type - Plan: EKG 12-Lead  Routine general medical examination at a health care facility - Plan: Cytology - PAP([place])  Screening for cervical cancer - Plan: Cytology - PAP([place])  Need for influenza vaccination - Plan: Flu Vaccine QUAD 6+ mos PF IM (Fluarix Quad PF)  Obesity (BMI 30.0-34.9)  Dizziness  Chronic idiopathic urticaria  She will continue on her present medication regimen and follow-up with the appropriate specialist.  Continue on Benadryl.  Discussed the dizziness with her and recommend she keep track of it in terms of other symptoms that might be associated with that and let me know if this occurs.

## 2018-10-26 LAB — CYTOLOGY - PAP
Adequacy: ABSENT
Diagnosis: NEGATIVE

## 2018-10-27 MED FILL — CONTOUR NEXT STRIPS: 30 days supply | Qty: 200 | Fill #1

## 2018-11-01 MED FILL — TRULICITY 0.75 MG/0.5 ML PE: 0.75 | 28 days supply | Qty: 2 | Fill #0

## 2018-11-10 MED FILL — LOSARTAN POTASSIUM 25 MG TA: 25 | 30 days supply | Qty: 30 | Fill #3

## 2018-11-10 MED FILL — ARMODAFINIL 250 MG TABLET: 250 | 90 days supply | Qty: 90 | Fill #1

## 2018-11-10 MED FILL — ATORVASTATIN 20 MG TABLET: 20 | 90 days supply | Qty: 90 | Fill #1

## 2018-11-15 DIAGNOSIS — J301 Allergic rhinitis due to pollen: Secondary | ICD-10-CM | POA: Diagnosis not present

## 2018-11-15 DIAGNOSIS — J3089 Other allergic rhinitis: Secondary | ICD-10-CM | POA: Diagnosis not present

## 2018-11-24 DIAGNOSIS — E11649 Type 2 diabetes mellitus with hypoglycemia without coma: Secondary | ICD-10-CM | POA: Diagnosis not present

## 2018-11-28 MED FILL — TRULICITY 0.75 MG/0.5 ML PE: 0.75 | 28 days supply | Qty: 2 | Fill #0

## 2018-11-29 MED FILL — LEVOTHYROXINE 150 MCG TAB: 150 | 90 days supply | Qty: 90 | Fill #1

## 2018-12-02 DIAGNOSIS — E119 Type 2 diabetes mellitus without complications: Secondary | ICD-10-CM | POA: Diagnosis not present

## 2018-12-02 DIAGNOSIS — E7800 Pure hypercholesterolemia, unspecified: Secondary | ICD-10-CM | POA: Diagnosis not present

## 2018-12-02 DIAGNOSIS — E1165 Type 2 diabetes mellitus with hyperglycemia: Secondary | ICD-10-CM | POA: Diagnosis not present

## 2018-12-02 DIAGNOSIS — E039 Hypothyroidism, unspecified: Secondary | ICD-10-CM | POA: Diagnosis not present

## 2018-12-06 MED FILL — LOSARTAN POTASSIUM 25 MG TA: 25 | 30 days supply | Qty: 30 | Fill #4

## 2018-12-07 DIAGNOSIS — J301 Allergic rhinitis due to pollen: Secondary | ICD-10-CM | POA: Diagnosis not present

## 2018-12-07 DIAGNOSIS — J3089 Other allergic rhinitis: Secondary | ICD-10-CM | POA: Diagnosis not present

## 2018-12-09 DIAGNOSIS — Z9641 Presence of insulin pump (external) (internal): Secondary | ICD-10-CM | POA: Diagnosis not present

## 2018-12-09 DIAGNOSIS — E7800 Pure hypercholesterolemia, unspecified: Secondary | ICD-10-CM | POA: Diagnosis not present

## 2018-12-09 DIAGNOSIS — E1165 Type 2 diabetes mellitus with hyperglycemia: Secondary | ICD-10-CM | POA: Diagnosis not present

## 2018-12-09 DIAGNOSIS — E039 Hypothyroidism, unspecified: Secondary | ICD-10-CM | POA: Diagnosis not present

## 2018-12-10 ENCOUNTER — Other Ambulatory Visit: Payer: Self-pay

## 2018-12-10 ENCOUNTER — Encounter: Payer: Self-pay | Admitting: Podiatry

## 2018-12-10 ENCOUNTER — Ambulatory Visit: Payer: 59 | Admitting: Podiatry

## 2018-12-10 VITALS — BP 124/60 | HR 82 | Resp 16

## 2018-12-10 DIAGNOSIS — L600 Ingrowing nail: Secondary | ICD-10-CM | POA: Diagnosis not present

## 2018-12-10 NOTE — Progress Notes (Signed)
Subjective:   Patient ID: Alice Jones, female   DOB: 58 y.o.   MRN: [id number]   HPI Patient presents concerned about the possibility for ingrown toenail of the left big toe stating it is been red but nonpainful and also is concerned about some tingling of the left big toe last month.  Patient does not smoke has diabetes under good control   Review of Systems  All other systems reviewed and are negative.       Objective:  Physical Exam Vitals signs and nursing note reviewed.  Constitutional:      Appearance: She is well-developed.  Pulmonary:     Effort: Pulmonary effort is normal.  Musculoskeletal: Normal range of motion.  Skin:    General: Skin is warm.  Neurological:     Mental Status: She is alert.     Neurovascular status intact muscle strength found to be adequate with patient found to have some irritation left hallux but localized with no drainage no erythema edema noted and no current tenderness with inability to find any change of sharp dull     Assessment:  Possibility for low-grade ingrown toenail discussed for possibility for low-grade trauma which may have created discoloration but not currently painful     Plan:  Reviewed condition do not recommend treatment but if it were to become red or any drainage were to occur may require nail border removal.  Patient will be seen back for [place] to recheck if symptoms indicate but at this point I do not see anything currently to be concerned about

## 2018-12-20 MED FILL — TRULICITY 0.75 MG/0.5 ML PE: 0.75 | 84 days supply | Qty: 6 | Fill #1

## 2018-12-27 ENCOUNTER — Other Ambulatory Visit: Payer: Self-pay

## 2018-12-27 DIAGNOSIS — Z20822 Contact with and (suspected) exposure to covid-19: Secondary | ICD-10-CM

## 2018-12-29 LAB — NOVEL CORONAVIRUS, NAA: SARS-CoV-2, NAA: NOT DETECTED

## 2019-01-06 DIAGNOSIS — H2513 Age-related nuclear cataract, bilateral: Secondary | ICD-10-CM | POA: Diagnosis not present

## 2019-01-06 DIAGNOSIS — H40033 Anatomical narrow angle, bilateral: Secondary | ICD-10-CM | POA: Diagnosis not present

## 2019-01-06 DIAGNOSIS — E119 Type 2 diabetes mellitus without complications: Secondary | ICD-10-CM | POA: Diagnosis not present

## 2019-01-06 DIAGNOSIS — H11823 Conjunctivochalasis, bilateral: Secondary | ICD-10-CM | POA: Diagnosis not present

## 2019-01-06 LAB — HM DIABETES EYE EXAM

## 2019-01-11 MED FILL — LOSARTAN POTASSIUM 25 MG TA: 25 | 30 days supply | Qty: 30 | Fill #5

## 2019-01-18 DIAGNOSIS — E1165 Type 2 diabetes mellitus with hyperglycemia: Secondary | ICD-10-CM | POA: Diagnosis not present

## 2019-01-18 DIAGNOSIS — Z794 Long term (current) use of insulin: Secondary | ICD-10-CM | POA: Diagnosis not present

## 2019-02-10 ENCOUNTER — Other Ambulatory Visit: Payer: Self-pay | Admitting: Family Medicine

## 2019-02-10 DIAGNOSIS — G47419 Narcolepsy without cataplexy: Secondary | ICD-10-CM

## 2019-02-10 MED FILL — ARMODAFINIL 250 MG TABLET: 250 | 90 days supply | Qty: 90 | Fill #0

## 2019-02-10 MED FILL — ATORVASTATIN 20 MG TABLET: 20 | 90 days supply | Qty: 90 | Fill #2

## 2019-02-10 MED FILL — HumaLOG 100 UNIT/ML SOLN: 100 | 80 days supply | Qty: 40 | Fill #2

## 2019-02-10 MED FILL — LOSARTAN POTASSIUM 25 MG TA: 25 | 30 days supply | Qty: 30 | Fill #6

## 2019-02-10 NOTE — Telephone Encounter (Signed)
[name] is requesting to fill pt armodafinil . Please advise [facility] 

## 2019-02-21 DIAGNOSIS — E11649 Type 2 diabetes mellitus with hypoglycemia without coma: Secondary | ICD-10-CM | POA: Diagnosis not present

## 2019-03-06 ENCOUNTER — Other Ambulatory Visit: Payer: Self-pay

## 2019-03-06 ENCOUNTER — Ambulatory Visit (INDEPENDENT_AMBULATORY_CARE_PROVIDER_SITE_OTHER): Payer: 59 | Admitting: Family Medicine

## 2019-03-06 ENCOUNTER — Encounter: Payer: Self-pay | Admitting: Family Medicine

## 2019-03-06 VITALS — BP 130/72 | HR 72 | Temp 96.8°F | Ht 61.0 in | Wt 179.0 lb

## 2019-03-06 DIAGNOSIS — N816 Rectocele: Secondary | ICD-10-CM | POA: Diagnosis not present

## 2019-03-06 MED FILL — LEVOTHYROXINE SODIUM 150 MC: 150 | 90 days supply | Qty: 90 | Fill #2

## 2019-03-06 MED FILL — LOSARTAN POTASSIUM 25 MG TA: 25 | 30 days supply | Qty: 30 | Fill #7

## 2019-03-06 NOTE — Patient Instructions (Signed)
About Rectocele  Overview  A rectocele is a type of hernia which causes different degrees of bulging of the rectal tissues into the vaginal wall.  You may even notice that it presses against the vaginal wall so much that some vaginal tissues droop outside of the opening of your vagina.  Causes of Rectocele  The most common cause is childbirth.  The muscles and ligaments in the pelvis that hold up and support the female organs and vagina become stretched and weakened during labor and delivery.  The more babies you have, the more the support tissues are stretched and weakened.  Not everyone who has a baby will develop a rectocele.  Some women have stronger supporting tissue in the pelvis and may not have as much of a problem as others.  Women who have a Cesarean section usually do not get rectocele's unless they pushed a long time prior to the cesarean delivery.  Other conditions that can cause a rectocele include chronic constipation, a chronic cough, a lot of heavy lifting, and obesity.  Older women may have this problem because the loss of female hormones causes the vaginal tissue to become weaker.  Symptoms  There may not be any symptoms.  If you do have symptoms, they may include:  Pelvic pressure in the rectal area  Protrusion of the lower part of the vagina through the opening of the vagina  Constipation and trapping of the stool, making it difficult to have a bowel movement.  In severe cases, you may have to press on the lower part of your vagina to help push the stool out of you rectum.  This is called splinting to empty.  Diagnosing Rectocele  Your health care provider will ask about your symptoms and perform a pelvic exam.  S/he will ask you to bear down, pushing like you are having a bowel movement so as to see how far the lower part of the vagina protrudes into the vagina and possible outside of the vagina.  Your provider will also ask you to contract the muscles of your pelvis  (like you are stopping the stream in the middle of urinating) to determine the strength of your pelvic muscles.  Your provider may also do a rectal exam.  Treatment Options  If you do not have any symptoms, no treatment may be necessary.  Other treatment options include:  Pelvic floor exercises: Contracting the muscles in your genital area may help strengthen your muscles and support the organs.  Be sure to get proper exercise instruction from you physical therapist.  A pessary (removealbe pelvic support device) sometimes helps rectocele symptoms.  Surgery: Surgical repair may be necessary. In some cases the uterus may need to be taken out ( a hysterectomy) as well.  There are many types of surgery for pelvic support problems.  Look for physicians who specialize in repair procedures.  You can take care of yourself by:  Treating and preventing constipation  Avoiding heavy lifting, and lifting correctly (with your legs, not with you waist or back)  Treating a chronic cough or bronchitis  Not smoking  avoiding too much weight gain  Doing pelvic floor exercises   2007, [employer] Doc.[id number]   Kegel Exercises  Kegel exercises can help strengthen your pelvic floor muscles. The pelvic floor is a group of muscles that support your rectum, small intestine, and bladder. In females, pelvic floor muscles also help support the womb (uterus). These muscles help you control the flow of urine  and stool. Kegel exercises are painless and simple, and they do not require any equipment. Your provider may suggest Kegel exercises to:  Improve bladder and bowel control.  Improve sexual response.  Improve weak pelvic floor muscles after surgery to remove the uterus (hysterectomy) or pregnancy (females).  Improve weak pelvic floor muscles after prostate gland removal or surgery (males). Kegel exercises involve squeezing your pelvic floor muscles, which are the same muscles you squeeze when  you try to stop the flow of urine or keep from passing gas. The exercises can be done while sitting, standing, or lying down, but it is best to vary your position. Exercises How to do Kegel exercises: 1. Squeeze your pelvic floor muscles tight. You should feel a tight lift in your rectal area. If you are a female, you should also feel a tightness in your vaginal area. Keep your stomach, buttocks, and legs relaxed. 2. Hold the muscles tight for up to 10 seconds. 3. Breathe normally. 4. Relax your muscles. 5. Repeat as told by your health care provider. Repeat this exercise daily as told by your health care provider. Continue to do this exercise for at least 4-6 weeks, or for as long as told by your health care provider. You may be referred to a physical therapist who can help you learn more about how to do Kegel exercises. Depending on your condition, your health care provider may recommend:  Varying how long you squeeze your muscles.  Doing several sets of exercises every day.  Doing exercises for several weeks.  Making Kegel exercises a part of your regular exercise routine. This information is not intended to replace advice given to you by your health care provider. Make sure you discuss any questions you have with your health care provider. Document Revised: 08/25/2017 Document Reviewed: 08/25/2017 Elsevier Patient Education  [employer] [place].

## 2019-03-06 NOTE — Progress Notes (Signed)
Chief Complaint  Patient presents with  . Consult    just noticed when she wiping after going to the bathroom that things felt different. Feels like she might have a prolapsed uterus. Did notice some blood as well, but not any urinary symptoms.    Things felt different, a slight discomfort noted while wiping last week.  Felt extra skin.  She also noted some blood on the toilet paper after touching this "extra skin" area. She noticed this on 2/11, persists but is less noticeable and no further blood noted.  She didn't recall straining, denies constipation, or any trauma. She denies any vaginal discharge.  Denies urinary complaints.  Stools tend to be on the softer side, no constipation, no bloody or black bowel movements.   PMH, PSH, SH reviewed Normal pap 10/2018  Outpatient Encounter Medications as of 03/06/2019  Medication Sig Note  . ACCU-CHEK SMARTVIEW test strip  12/13/2013: Received from: External Pharmacy  . Armodafinil 250 MG tablet TAKE 1 TABLET BY MOUTH DAILY.   Marland Kitchen atorvastatin (LIPITOR) 20 MG tablet Take 1 tablet (20 mg total) by mouth daily.   . cholecalciferol (VITAMIN D) 1000 UNITS tablet Take 1,000 Units by mouth daily.   . Coenzyme Q10 (COQ10) 100 MG CAPS Take 1 capsule by mouth daily.   . diphenhydrAMINE (BENADRYL) 25 MG tablet Take 12.5-25 mg by mouth at bedtime.    . Esomeprazole Magnesium (NEXIUM PO) Take 20 mg by mouth daily.  11/15/2014: sts. taking otc dose/fim  . fish oil-omega-3 fatty acids 1000 MG capsule Take 2 g by mouth daily.   Marland Kitchen HUMALOG 100 UNIT/ML injection    . levothyroxine (SYNTHROID) 150 MCG tablet    . losartan (COZAAR) 25 MG tablet Take 1 tablet (25 mg total) by mouth daily.   . Multiple Vitamins-Minerals (MULTIVITAMINS) CHEW Chew 1 tablet by mouth daily.   . NON FORMULARY Take 1 capsule by mouth daily. Tumeric   . NON FORMULARY Take 2 capsules by mouth daily. Elysium supplement   . TRULICITY [id number] 0000000 SOPN    . albuterol (PROVENTIL HFA) 108 (90  Base) MCG/ACT inhaler Inhale 1-2 puffs into the lungs every 6 (six) hours as needed for up to 5 days for wheezing or shortness of breath (SOB).   . DYMISTA 137-50 MCG/ACT SUSP Place 1 spray into both nostrils 2 (two) times daily.   . meloxicam (MOBIC) 7.5 MG tablet Take 7.5 mg by mouth daily.   . ondansetron (ZOFRAN-ODT) 8 MG disintegrating tablet Take 1 tablet (8 mg total) by mouth every 8 (eight) hours as needed for nausea. (Patient not taking: Reported on 09/02/2017)   . [DISCONTINUED] doxycycline (VIBRA-TABS) 100 MG tablet Take 1 tablet (100 mg total) by mouth 2 (two) times daily. (Patient not taking: Reported on 01/13/2018)   . [DISCONTINUED] Exenatide ER 2 MG PEN Inject 2 mg into the skin once a week.   . [DISCONTINUED] levothyroxine (SYNTHROID, LEVOTHROID) 137 MCG tablet Take 155 mcg by mouth daily before breakfast.    . [DISCONTINUED] phenazopyridine (PYRIDIUM) 95 MG tablet Take 95 mg by mouth 3 (three) times daily as needed for pain.   . [DISCONTINUED] TRESIBA FLEXTOUCH 100 UNIT/ML SOPN FlexTouch Pen INJECT 5 UNITS INTO THE SKIN ONCE A DAY    No facility-administered encounter medications on file as of 03/06/2019.   Allergies  Allergen Reactions  . Liraglutide     nausea   ROS: no fever, chills, GI or GU complaints other than as noted in HPI.  No URI  symptoms, shortness of breath or other complaints.    PHYSICAL EXAM:  BP 130/72   Pulse 72   Temp (!) 96.8 F (36 C) (Other (Comment))   Ht 5\' 1"  (1.549 m)   Wt 179 lb (81.2 kg)   BMI 33.82 kg/m   Well-appearing, pleasant female, in no distress HEENT: conjunctiva and sclera are clear, EOMI.  Wearing mask due to COVID-19 pandemic Heart: regular rate and rhythm Lungs: clear bilaterally Abdomen: soft, nontender Extremities: no edema.  Insulin pump on RLE GU: normal external genitalia without lesions.  No visible protuberance. Bimanual exam--uterus is in normal location, no cervical motion tenderness; uterus is normal in size, no  adnexal masses or tenderness.  With valsalva and cough, there is some slight movement of the bladder and urethra (mild).  There is also some relaxation noted posteriorly. Rectal exam--soft, heme negative stool.  The area of her concern is protuberance of the rectum into the vagina, with thin wall between the two.   ASSESSMENT/PLAN:  Rectocele  Minimal cystocele.  Educated re: dx and possible treatments. Encouraged Kegel exercises. Avoid straining. Contact us if symptoms persist or worsen for referral to GYN for further evaluation and treatment.

## 2019-03-21 MED FILL — TRULICITY 0.75 MG/0.5 ML PE: 0.75 | 28 days supply | Qty: 2 | Fill #2

## 2019-03-31 ENCOUNTER — Ambulatory Visit: Payer: 59 | Attending: Internal Medicine

## 2019-03-31 DIAGNOSIS — Z23 Encounter for immunization: Secondary | ICD-10-CM

## 2019-03-31 NOTE — Progress Notes (Signed)
   Covid-19 Vaccination Clinic  Name:  Alice Jones    MRN: [id number] DOB: 02-20-1960  03/31/2019  Alice Jones was observed post Covid-19 immunization for 15 minutes without incident. She was provided with Vaccine Information Sheet and instruction to access the V-Safe system.   Alice Jones was instructed to call 911 with any severe reactions post vaccine: Marland Kitchen Difficulty breathing  . Swelling of face and throat  . A fast heartbeat  . A bad rash all over body  . Dizziness and weakness   Immunizations Administered    Name Date Dose VIS Date Route   Pfizer COVID-19 Vaccine 03/31/2019  1:43 PM 0.3 mL 12/30/2018 Intramuscular   Manufacturer: [place]   Lot: [id number]   [place]: [id number]

## 2019-04-13 MED FILL — LOSARTAN POTASSIUM 25 MG TA: 25 | 30 days supply | Qty: 30 | Fill #8

## 2019-04-18 MED FILL — TRULICITY 0.75 MG/0.5 ML PE: 0.75 | 28 days supply | Qty: 2 | Fill #3

## 2019-04-19 DIAGNOSIS — Z794 Long term (current) use of insulin: Secondary | ICD-10-CM | POA: Diagnosis not present

## 2019-04-19 DIAGNOSIS — E1165 Type 2 diabetes mellitus with hyperglycemia: Secondary | ICD-10-CM | POA: Diagnosis not present

## 2019-04-26 ENCOUNTER — Ambulatory Visit: Payer: 59 | Attending: Internal Medicine

## 2019-04-26 DIAGNOSIS — Z23 Encounter for immunization: Secondary | ICD-10-CM

## 2019-04-26 NOTE — Progress Notes (Signed)
   Covid-19 Vaccination Clinic  Name:  Alice Jones    MRN: [id number] DOB: 10-17-1960  04/26/2019  [name] was observed post Covid-19 immunization for 15 minutes without incident. She was provided with Vaccine Information Sheet and instruction to access the V-Safe system.   [name] was instructed to call 911 with any severe reactions post vaccine: Marland Kitchen Difficulty breathing  . Swelling of face and throat  . A fast heartbeat  . A bad rash all over body  . Dizziness and weakness   Immunizations Administered    Name Date Dose VIS Date Route   Pfizer COVID-19 Vaccine 04/26/2019  4:27 PM 0.3 mL 12/30/2018 Intramuscular   Manufacturer: [place]   Lot: [id number]   [place]: [id number]

## 2019-05-12 ENCOUNTER — Other Ambulatory Visit: Payer: Self-pay

## 2019-05-12 ENCOUNTER — Encounter: Payer: Self-pay | Admitting: Medical

## 2019-05-12 ENCOUNTER — Ambulatory Visit: Payer: 59 | Admitting: Medical

## 2019-05-12 VITALS — BP 140/84 | HR 71 | Temp 98.0°F | Ht 61.0 in | Wt 184.4 lb

## 2019-05-12 DIAGNOSIS — K120 Recurrent oral aphthae: Secondary | ICD-10-CM | POA: Diagnosis not present

## 2019-05-12 MED ORDER — TRIAMCINOLONE ACETONIDE 0.1 % MT PSTE: 1.0000 "application " | PASTE | Freq: Two times a day (BID) | OROMUCOSAL | 0 refills | Status: DC

## 2019-05-12 MED FILL — ATORVASTATIN 20 MG TABLET: 20 | 90 days supply | Qty: 90 | Fill #3

## 2019-05-12 MED FILL — ARMODAFINIL 250 MG TABLET: 250 | 90 days supply | Qty: 90 | Fill #1

## 2019-05-12 MED FILL — LOSARTAN POTASSIUM 25 MG TA: 25 | 30 days supply | Qty: 30 | Fill #9

## 2019-05-12 MED FILL — TRIAMCINOLONE 0.1% PASTE: 0.1 | 7 days supply | Qty: 5 | Fill #0

## 2019-05-12 NOTE — Progress Notes (Signed)
Subjective: Chief Complaint  Patient presents with  . Mouth Lesions    roof of mouth    Has had sore place swollen in roof of mouth posteriorly for 2-3 days.   Gargling with salt water.  Has had occasional cough.  Has lots of allergy problems in the spring, but no concern for illnesses.  No prior similar mouth issue.  She has family hx/o oral cancer, worried about this.   She uses no tobacco.  No other aggravating or relieving factors. No other complaint.   Past Medical History:  Diagnosis Date  . Allergic rhinitis   . Carpal tunnel syndrome   . Complication of anesthesia    pt states difficulty with achieving adequate sedation. ? due to narcolepsy med  . Diabetes mellitus    Dr. Chalmers Cater  . Herniated cervical disc   . Hyperlipemia   . Hypothyroid   . Narcolepsy   . Thyroid disease    Current Outpatient Medications on File Prior to Visit  Medication Sig Dispense Refill  . ACCU-CHEK SMARTVIEW test strip   11  . Armodafinil 250 MG tablet TAKE 1 TABLET BY MOUTH DAILY. 90 tablet 3  . atorvastatin (LIPITOR) 20 MG tablet Take 1 tablet (20 mg total) by mouth daily. 90 tablet 3  . cholecalciferol (VITAMIN D) 1000 UNITS tablet Take 1,000 Units by mouth daily.    . Coenzyme Q10 (COQ10) 100 MG CAPS Take 1 capsule by mouth daily.    . diphenhydrAMINE (BENADRYL) 25 MG tablet Take 12.5-25 mg by mouth at bedtime.     . Esomeprazole Magnesium (NEXIUM PO) Take 20 mg by mouth daily.     . fish oil-omega-3 fatty acids 1000 MG capsule Take 2 g by mouth daily.    Marland Kitchen HUMALOG 100 UNIT/ML injection     . levothyroxine (SYNTHROID) 150 MCG tablet     . losartan (COZAAR) 25 MG tablet Take 1 tablet (25 mg total) by mouth daily. 90 tablet 3  . Multiple Vitamins-Minerals (MULTIVITAMINS) CHEW Chew 1 tablet by mouth daily.    . NON FORMULARY Take 1 capsule by mouth daily. Tumeric    . NON FORMULARY Take 2 capsules by mouth daily. Elysium supplement    . TRULICITY [id number] 0000000 SOPN     . albuterol (PROVENTIL  HFA) 108 (90 Base) MCG/ACT inhaler Inhale 1-2 puffs into the lungs every 6 (six) hours as needed for up to 5 days for wheezing or shortness of breath (SOB). 1 Inhaler 0  . DYMISTA 137-50 MCG/ACT SUSP Place 1 spray into both nostrils 2 (two) times daily.  6  . meloxicam (MOBIC) 7.5 MG tablet Take 7.5 mg by mouth daily.    . ondansetron (ZOFRAN-ODT) 8 MG disintegrating tablet Take 1 tablet (8 mg total) by mouth every 8 (eight) hours as needed for nausea. (Patient not taking: Reported on 09/02/2017) 20 tablet 0   No current facility-administered medications on file prior to visit.   ROS as in subjective   Objective BP 140/84   Pulse 71   Temp 98 F (36.7 C)   Ht 5\' 1"  (1.549 m)   Wt 184 lb 6.4 oz (83.6 kg)   SpO2 96%   BMI 34.84 kg/m   Gen: wd, wn, nad She has a small 3 mm x 5 mm patch of erythema in the roof of the mouth on the hard palate, otherwise no oral lesions, no abnormal findings inside the mouth of the tongue or buccal mucosa Neck: Supple nontender no lymphadenopathy no  mass no thyromegaly    Assessment: Encounter Diagnosis  Name Primary?  . Aphthous ulcer Yes      Plan: Findings suggestive of aphthous ulcer. Continue salt water gargles, good water intake. Can use medication below topically on the lesion. Discussed usual timeframe for lesion to heal within the next week and a half. No other abnormal findings. We discussed her family history of oral cancer and I advise no obvious concerns for tumor, no obvious lymph nodes enlarged. Follow-up as needed with PCP here  Nalea was seen today for mouth lesions.  Diagnoses and all orders for this visit:  Aphthous ulcer  Other orders -     triamcinolone (KENALOG) 0.1 % paste; Use as directed 1 application in the mouth or throat 2 (two) times daily.

## 2019-05-19 DIAGNOSIS — E11649 Type 2 diabetes mellitus with hypoglycemia without coma: Secondary | ICD-10-CM | POA: Diagnosis not present

## 2019-05-24 MED FILL — TRULICITY 0.75 MG/0.5 ML PE: 0.75 | 28 days supply | Qty: 2 | Fill #4

## 2019-05-24 MED FILL — LEVOTHYROXINE SODIUM 150 MC: 150 | 90 days supply | Qty: 90 | Fill #3

## 2019-05-26 DIAGNOSIS — J3089 Other allergic rhinitis: Secondary | ICD-10-CM | POA: Diagnosis not present

## 2019-05-26 DIAGNOSIS — J301 Allergic rhinitis due to pollen: Secondary | ICD-10-CM | POA: Diagnosis not present

## 2019-06-14 ENCOUNTER — Other Ambulatory Visit (HOSPITAL_COMMUNITY): Payer: Self-pay | Admitting: Endocrinology

## 2019-06-14 MED FILL — HumaLOG 100 UNIT/ML SOLN: 100 | 80 days supply | Qty: 40 | Fill #0

## 2019-06-14 MED FILL — LOSARTAN POTASSIUM 25 MG TA: 25 | 30 days supply | Qty: 30 | Fill #10

## 2019-06-15 MED FILL — TRULICITY 0.75 MG/0.5 ML PE: 0.75 | 28 days supply | Qty: 2 | Fill #5

## 2019-07-10 ENCOUNTER — Other Ambulatory Visit (HOSPITAL_COMMUNITY): Payer: Self-pay | Admitting: Endocrinology

## 2019-07-10 DIAGNOSIS — E1165 Type 2 diabetes mellitus with hyperglycemia: Secondary | ICD-10-CM | POA: Diagnosis not present

## 2019-07-10 DIAGNOSIS — E039 Hypothyroidism, unspecified: Secondary | ICD-10-CM | POA: Diagnosis not present

## 2019-07-10 DIAGNOSIS — R252 Cramp and spasm: Secondary | ICD-10-CM | POA: Diagnosis not present

## 2019-07-10 DIAGNOSIS — E7800 Pure hypercholesterolemia, unspecified: Secondary | ICD-10-CM | POA: Diagnosis not present

## 2019-07-10 DIAGNOSIS — Z9641 Presence of insulin pump (external) (internal): Secondary | ICD-10-CM | POA: Diagnosis not present

## 2019-07-10 MED FILL — TRULICITY 1.5 MG/0.5 ML PEN: 1.5 | 28 days supply | Qty: 2 | Fill #0

## 2019-07-10 MED FILL — LOSARTAN POTASSIUM 25 MG TA: 25 | 30 days supply | Qty: 30 | Fill #11

## 2019-07-21 DIAGNOSIS — R252 Cramp and spasm: Secondary | ICD-10-CM | POA: Diagnosis not present

## 2019-07-21 DIAGNOSIS — E7800 Pure hypercholesterolemia, unspecified: Secondary | ICD-10-CM | POA: Diagnosis not present

## 2019-07-21 DIAGNOSIS — E1165 Type 2 diabetes mellitus with hyperglycemia: Secondary | ICD-10-CM | POA: Diagnosis not present

## 2019-07-21 DIAGNOSIS — E039 Hypothyroidism, unspecified: Secondary | ICD-10-CM | POA: Diagnosis not present

## 2019-07-25 ENCOUNTER — Other Ambulatory Visit (HOSPITAL_COMMUNITY): Payer: Self-pay | Admitting: Endocrinology

## 2019-07-25 DIAGNOSIS — E1165 Type 2 diabetes mellitus with hyperglycemia: Secondary | ICD-10-CM | POA: Diagnosis not present

## 2019-07-25 DIAGNOSIS — Z794 Long term (current) use of insulin: Secondary | ICD-10-CM | POA: Diagnosis not present

## 2019-07-25 MED FILL — LEVOTHYROXINE 175 MCG TABLE: 175 | 30 days supply | Qty: 30 | Fill #0

## 2019-08-08 ENCOUNTER — Other Ambulatory Visit: Payer: Self-pay | Admitting: Family Medicine

## 2019-08-08 DIAGNOSIS — E785 Hyperlipidemia, unspecified: Secondary | ICD-10-CM

## 2019-08-08 DIAGNOSIS — E119 Type 2 diabetes mellitus without complications: Secondary | ICD-10-CM

## 2019-08-08 MED FILL — ATORVASTATIN 20 MG TABLET: 20 | 90 days supply | Qty: 90 | Fill #0

## 2019-08-08 MED FILL — TRULICITY 1.5 MG/0.5 ML PEN: 1.5 | 28 days supply | Qty: 2 | Fill #1

## 2019-08-08 MED FILL — LOSARTAN POTASSIUM 25 MG TA: 25 | 30 days supply | Qty: 30 | Fill #0

## 2019-08-08 MED FILL — ARMODAFINIL 250 MG TABLET: 250 | 90 days supply | Qty: 90 | Fill #2

## 2019-08-16 DIAGNOSIS — E11649 Type 2 diabetes mellitus with hypoglycemia without coma: Secondary | ICD-10-CM | POA: Diagnosis not present

## 2019-08-25 MED FILL — LEVOTHYROXINE 175 MCG TABLE: 175 | 30 days supply | Qty: 30 | Fill #1

## 2019-08-29 MED FILL — CONTOUR NEXT STRIPS: 30 days supply | Qty: 200 | Fill #0

## 2019-08-31 MED FILL — TRULICITY 1.5 MG/0.5 ML PEN: 1.5 | 28 days supply | Qty: 2 | Fill #2

## 2019-09-01 MED FILL — LOSARTAN POTASSIUM 25 MG TA: 25 | 30 days supply | Qty: 30 | Fill #1

## 2019-09-15 DIAGNOSIS — E039 Hypothyroidism, unspecified: Secondary | ICD-10-CM | POA: Diagnosis not present

## 2019-09-27 MED FILL — LOSARTAN POTASSIUM 25 MG TA: 25 | 30 days supply | Qty: 30 | Fill #2

## 2019-09-27 MED FILL — TRULICITY 1.5 MG/0.5 ML PEN: 1.5 | 28 days supply | Qty: 2 | Fill #3

## 2019-09-27 MED FILL — LEVOTHYROXINE 175 MCG TABLE: 175 | 30 days supply | Qty: 30 | Fill #2

## 2019-10-25 DIAGNOSIS — Z794 Long term (current) use of insulin: Secondary | ICD-10-CM | POA: Diagnosis not present

## 2019-10-25 DIAGNOSIS — E1165 Type 2 diabetes mellitus with hyperglycemia: Secondary | ICD-10-CM | POA: Diagnosis not present

## 2019-10-27 ENCOUNTER — Encounter: Payer: 59 | Admitting: Family Medicine

## 2019-10-31 MED FILL — TRULICITY 1.5 MG/0.5 ML PEN: 1.5 | 28 days supply | Qty: 2 | Fill #4

## 2019-10-31 MED FILL — LEVOTHYROXINE 175 MCG TABLE: 175 | 30 days supply | Qty: 30 | Fill #3

## 2019-10-31 MED FILL — LOSARTAN POTASSIUM 25 MG TA: 25 | 30 days supply | Qty: 30 | Fill #3

## 2019-10-31 MED FILL — ATORVASTATIN 20 MG TABLET: 20 | 90 days supply | Qty: 90 | Fill #1

## 2019-11-03 ENCOUNTER — Telehealth: Payer: Self-pay | Admitting: Family Medicine

## 2019-11-03 DIAGNOSIS — G47419 Narcolepsy without cataplexy: Secondary | ICD-10-CM

## 2019-11-03 NOTE — Telephone Encounter (Signed)
She needs a follow-up appointment 

## 2019-11-03 NOTE — Telephone Encounter (Signed)
[place] is requesting to fill pt  Armodafinil. Please advise [facility]

## 2019-11-06 ENCOUNTER — Other Ambulatory Visit: Payer: Self-pay | Admitting: Family Medicine

## 2019-11-06 MED ORDER — ARMODAFINIL 250 MG PO TABS: 250.0000 mg | ORAL_TABLET | Freq: Every day | ORAL | 0 refills | Status: DC

## 2019-11-06 MED FILL — ARMODAFINIL 250 MG TABLET: 250 | 90 days supply | Qty: 90 | Fill #0

## 2019-11-06 NOTE — Addendum Note (Signed)
Addended by: Denita Lung on: 11/06/2019 08:27 AM   Modules accepted: Orders

## 2019-11-13 DIAGNOSIS — E11649 Type 2 diabetes mellitus with hypoglycemia without coma: Secondary | ICD-10-CM | POA: Diagnosis not present

## 2019-11-14 MED FILL — HumaLOG 100 UNIT/ML SOLN: 100 | 80 days supply | Qty: 40 | Fill #1

## 2019-11-17 ENCOUNTER — Encounter: Payer: Self-pay | Admitting: Family Medicine

## 2019-11-17 ENCOUNTER — Other Ambulatory Visit: Payer: Self-pay

## 2019-11-17 ENCOUNTER — Telehealth: Payer: Self-pay

## 2019-11-17 ENCOUNTER — Ambulatory Visit (INDEPENDENT_AMBULATORY_CARE_PROVIDER_SITE_OTHER): Payer: 59 | Admitting: Family Medicine

## 2019-11-17 ENCOUNTER — Other Ambulatory Visit: Payer: Self-pay | Admitting: Family Medicine

## 2019-11-17 VITALS — BP 122/80 | HR 72 | Temp 97.1°F | Ht 61.5 in | Wt 185.2 lb

## 2019-11-17 DIAGNOSIS — H8120 Vestibular neuronitis, unspecified ear: Secondary | ICD-10-CM | POA: Diagnosis not present

## 2019-11-17 DIAGNOSIS — E039 Hypothyroidism, unspecified: Secondary | ICD-10-CM | POA: Diagnosis not present

## 2019-11-17 DIAGNOSIS — Z0000 Encounter for general adult medical examination without abnormal findings: Secondary | ICD-10-CM

## 2019-11-17 DIAGNOSIS — H4020X4 Unspecified primary angle-closure glaucoma, indeterminate stage: Secondary | ICD-10-CM | POA: Diagnosis not present

## 2019-11-17 DIAGNOSIS — E669 Obesity, unspecified: Secondary | ICD-10-CM

## 2019-11-17 DIAGNOSIS — R0689 Other abnormalities of breathing: Secondary | ICD-10-CM | POA: Diagnosis not present

## 2019-11-17 DIAGNOSIS — M5020 Other cervical disc displacement, unspecified cervical region: Secondary | ICD-10-CM | POA: Diagnosis not present

## 2019-11-17 DIAGNOSIS — G47419 Narcolepsy without cataplexy: Secondary | ICD-10-CM | POA: Diagnosis not present

## 2019-11-17 DIAGNOSIS — E119 Type 2 diabetes mellitus without complications: Secondary | ICD-10-CM | POA: Diagnosis not present

## 2019-11-17 DIAGNOSIS — J309 Allergic rhinitis, unspecified: Secondary | ICD-10-CM | POA: Diagnosis not present

## 2019-11-17 DIAGNOSIS — E785 Hyperlipidemia, unspecified: Secondary | ICD-10-CM | POA: Diagnosis not present

## 2019-11-17 DIAGNOSIS — L501 Idiopathic urticaria: Secondary | ICD-10-CM

## 2019-11-17 DIAGNOSIS — Z23 Encounter for immunization: Secondary | ICD-10-CM

## 2019-11-17 DIAGNOSIS — Z1159 Encounter for screening for other viral diseases: Secondary | ICD-10-CM | POA: Diagnosis not present

## 2019-11-17 MED ORDER — ATORVASTATIN CALCIUM 20 MG PO TABS: 20.0000 mg | ORAL_TABLET | Freq: Every day | ORAL | 3 refills | Status: DC

## 2019-11-17 MED ORDER — ARMODAFINIL 250 MG PO TABS: 250.0000 mg | ORAL_TABLET | Freq: Every day | ORAL | 3 refills | Status: DC

## 2019-11-17 NOTE — Telephone Encounter (Signed)
Please request lbas from Dr. Chalmers Cater office. Thanks   Blima Singer. RMA

## 2019-11-17 NOTE — Telephone Encounter (Signed)
She will need to sign a medical records request.

## 2019-11-17 NOTE — Progress Notes (Signed)
° °  Subjective:    Patient ID: Alice Jones, female    DOB: 11-Jun-1960, 59 y.o.   MRN: [id number]  HPI She is here for complete examination.  She is taking care of by endocrine for her thyroid as well as diabetes related concerns.  She does have underlying glaucoma and is seeing ophthalmology about that.  She does have allergic rhinitis and has had shots in the past but at the present time seems be doing well without follow-up shots.  She does have underlying urticaria and does use Benadryl consistently to keep this under good control.  She has a previous history of cervical disc disease and has had surgery.  This seems to be quiet at the present time but she does occasionally use Voltaren topical which helps.  She does have narcolepsy and her medication is working quite well for her.  She does occasionally have choking sensation around sleep.  This sometimes does cause some anxiety.  She does occasionally have difficulty with dizziness but none at the present time.  She does occasionally complain of cramping sensation in her hands.  She does a lot of manual work around the house and finds that this sometimes does  Interfere. her weight is stable.  She is in a long-term monogamous relationship.  Otherwise her family and social history as well as health maintenance immunizations was reviewed.   Review of Systems  All other systems reviewed and are negative.      Objective:   Physical Exam Alert and in no distress. Tympanic membranes and canals are normal. Pharyngeal area is normal. Neck is supple without adenopathy or thyromegaly. Cardiac exam shows a regular sinus rhythm without murmurs or gallops. Lungs are clear to auscultation. Abdominal exam shows no masses or tenderness.      Assessment & Plan:  Type 2 diabetes mellitus without complication, without long-term current use of insulin (HCC)  Angle-closure glaucoma, indeterminate stage  Hyperlipidemia, unspecified hyperlipidemia type -  Plan: atorvastatin (LIPITOR) 20 MG tablet  Allergic rhinitis, unspecified seasonality, unspecified trigger  Narcolepsy without cataplexy - Plan: Armodafinil 250 MG tablet  Herniated cervical disc  Obesity (BMI 30.0-34.9)  Vestibular neuronitis, unspecified laterality  Hypothyroidism, unspecified type  Sleep related choking sensation  Chronic idiopathic urticaria  Need for influenza vaccination - Plan: Flu Vaccine QUAD 36+ mos IM  Need for hepatitis C screening test - Plan: Hepatitis C antibody  Routine general medical examination at a health care facility  She will continue to be followed by endocrine.  She will follow up with ophthalmology.  I will get the results of the colonoscopy.  She will continue on her mode of fentanyl for the narcolepsy.  Continue on Benadryl for the urticaria.  She has not had any difficulty recently with dizziness so no intervention needed there.  Encouraged her to continue with her diet and exercise.

## 2019-11-18 LAB — HEPATITIS C ANTIBODY: Hep C Virus Ab: <0.1 s/co ratio (ref 0.0–0.9)

## 2019-11-20 DIAGNOSIS — Z9641 Presence of insulin pump (external) (internal): Secondary | ICD-10-CM | POA: Diagnosis not present

## 2019-11-20 DIAGNOSIS — E039 Hypothyroidism, unspecified: Secondary | ICD-10-CM | POA: Diagnosis not present

## 2019-11-20 DIAGNOSIS — E7800 Pure hypercholesterolemia, unspecified: Secondary | ICD-10-CM | POA: Diagnosis not present

## 2019-11-20 DIAGNOSIS — E1165 Type 2 diabetes mellitus with hyperglycemia: Secondary | ICD-10-CM | POA: Diagnosis not present

## 2019-11-24 ENCOUNTER — Other Ambulatory Visit (HOSPITAL_COMMUNITY): Payer: Self-pay | Admitting: Endocrinology

## 2019-11-24 DIAGNOSIS — Z9641 Presence of insulin pump (external) (internal): Secondary | ICD-10-CM | POA: Diagnosis not present

## 2019-11-24 DIAGNOSIS — E1165 Type 2 diabetes mellitus with hyperglycemia: Secondary | ICD-10-CM | POA: Diagnosis not present

## 2019-11-24 DIAGNOSIS — E7800 Pure hypercholesterolemia, unspecified: Secondary | ICD-10-CM | POA: Diagnosis not present

## 2019-11-24 DIAGNOSIS — E039 Hypothyroidism, unspecified: Secondary | ICD-10-CM | POA: Diagnosis not present

## 2019-11-24 DIAGNOSIS — M19049 Primary osteoarthritis, unspecified hand: Secondary | ICD-10-CM | POA: Diagnosis not present

## 2019-11-24 DIAGNOSIS — R945 Abnormal results of liver function studies: Secondary | ICD-10-CM | POA: Diagnosis not present

## 2019-11-24 MED FILL — ONDANSETRON HCL 4 MG TABLET: 4 | 10 days supply | Qty: 30 | Fill #0

## 2019-11-24 MED FILL — TRULICITY 3 MG/0.5ML SOPN: 3 | 28 days supply | Qty: 2 | Fill #0

## 2019-11-30 ENCOUNTER — Encounter: Payer: Self-pay | Admitting: Family Medicine

## 2019-11-30 MED FILL — LEVOTHYROXINE 175 MCG TABLE: 175 | 30 days supply | Qty: 30 | Fill #4

## 2019-12-26 MED FILL — TRULICITY 3 MG/0.5ML SOPN: 3 | 28 days supply | Qty: 2 | Fill #1

## 2019-12-26 MED FILL — LEVOTHYROXINE 175 MCG TABLE: 175 | 30 days supply | Qty: 30 | Fill #5

## 2019-12-26 MED FILL — ONDANSETRON HCL 4 MG TABLET: 4 | 10 days supply | Qty: 30 | Fill #1

## 2020-01-18 DIAGNOSIS — E1165 Type 2 diabetes mellitus with hyperglycemia: Secondary | ICD-10-CM | POA: Diagnosis not present

## 2020-01-18 DIAGNOSIS — Z794 Long term (current) use of insulin: Secondary | ICD-10-CM | POA: Diagnosis not present

## 2020-01-23 MED FILL — TRULICITY 3 MG/0.5ML SOPN: 3 | 28 days supply | Qty: 2 | Fill #2

## 2020-01-31 MED FILL — ONDANSETRON HCL 4 MG TABLET: 4 | 10 days supply | Qty: 30 | Fill #2

## 2020-01-31 MED FILL — ATORVASTATIN CALCIUM 20 MG: 20 | 90 days supply | Qty: 90 | Fill #2

## 2020-01-31 MED FILL — LEVOTHYROXINE 175 MCG TABLE: 175 | 30 days supply | Qty: 30 | Fill #6

## 2020-02-02 MED FILL — ARMODAFINIL 250 MG TABLET: 250 | 90 days supply | Qty: 90 | Fill #0

## 2020-02-05 ENCOUNTER — Other Ambulatory Visit: Payer: Self-pay | Admitting: Family Medicine

## 2020-02-05 DIAGNOSIS — E119 Type 2 diabetes mellitus without complications: Secondary | ICD-10-CM

## 2020-02-05 MED FILL — LOSARTAN POTASSIUM 25 MG TA: 25 | 30 days supply | Qty: 30 | Fill #0

## 2020-02-06 DIAGNOSIS — S139XXA Sprain of joints and ligaments of unspecified parts of neck, initial encounter: Secondary | ICD-10-CM | POA: Diagnosis not present

## 2020-02-08 DIAGNOSIS — E11649 Type 2 diabetes mellitus with hypoglycemia without coma: Secondary | ICD-10-CM | POA: Diagnosis not present

## 2020-02-20 MED FILL — TRULICITY 3 MG/0.5ML SOPN: 3 | 28 days supply | Qty: 2 | Fill #3

## 2020-02-20 MED FILL — HumaLOG 100 UNIT/ML SOLN: 100 | 80 days supply | Qty: 40 | Fill #2

## 2020-02-21 DIAGNOSIS — H11823 Conjunctivochalasis, bilateral: Secondary | ICD-10-CM | POA: Diagnosis not present

## 2020-02-21 DIAGNOSIS — E119 Type 2 diabetes mellitus without complications: Secondary | ICD-10-CM | POA: Diagnosis not present

## 2020-02-21 DIAGNOSIS — H40033 Anatomical narrow angle, bilateral: Secondary | ICD-10-CM | POA: Diagnosis not present

## 2020-02-21 DIAGNOSIS — H2513 Age-related nuclear cataract, bilateral: Secondary | ICD-10-CM | POA: Diagnosis not present

## 2020-02-21 LAB — HM DIABETES EYE EXAM

## 2020-03-04 ENCOUNTER — Other Ambulatory Visit (HOSPITAL_COMMUNITY): Payer: Self-pay | Admitting: Endocrinology

## 2020-03-04 MED FILL — LEVOTHYROXINE 175 MCG TABLE: 175 | 30 days supply | Qty: 30 | Fill #0

## 2020-03-04 MED FILL — LOSARTAN POTASSIUM 25 MG TA: 25 | 30 days supply | Qty: 30 | Fill #1

## 2020-03-20 MED FILL — TRULICITY 3 MG/0.5ML SOPN: 3 | 28 days supply | Qty: 2 | Fill #4

## 2020-03-28 MED FILL — LEVOTHYROXINE 175 MCG TABLE: 175 | 30 days supply | Qty: 30 | Fill #1

## 2020-03-28 MED FILL — LOSARTAN POTASSIUM 25 MG TA: 25 | 30 days supply | Qty: 30 | Fill #2

## 2020-04-12 DIAGNOSIS — E1165 Type 2 diabetes mellitus with hyperglycemia: Secondary | ICD-10-CM | POA: Diagnosis not present

## 2020-04-12 DIAGNOSIS — E7800 Pure hypercholesterolemia, unspecified: Secondary | ICD-10-CM | POA: Diagnosis not present

## 2020-04-12 DIAGNOSIS — R945 Abnormal results of liver function studies: Secondary | ICD-10-CM | POA: Diagnosis not present

## 2020-04-12 DIAGNOSIS — E039 Hypothyroidism, unspecified: Secondary | ICD-10-CM | POA: Diagnosis not present

## 2020-04-18 DIAGNOSIS — E1165 Type 2 diabetes mellitus with hyperglycemia: Secondary | ICD-10-CM | POA: Diagnosis not present

## 2020-04-18 DIAGNOSIS — Z794 Long term (current) use of insulin: Secondary | ICD-10-CM | POA: Diagnosis not present

## 2020-04-20 ENCOUNTER — Other Ambulatory Visit (HOSPITAL_COMMUNITY): Payer: Self-pay

## 2020-04-24 ENCOUNTER — Other Ambulatory Visit (HOSPITAL_COMMUNITY): Payer: Self-pay

## 2020-04-24 MED FILL — Atorvastatin Calcium Tab 20 MG (Base Equivalent): ORAL | 90 days supply | Qty: 90 | Fill #0 | Status: AC

## 2020-04-24 MED FILL — Losartan Potassium Tab 25 MG: ORAL | 30 days supply | Qty: 30 | Fill #0 | Status: AC

## 2020-04-24 MED FILL — Levothyroxine Sodium Tab 175 MCG: ORAL | 30 days supply | Qty: 30 | Fill #0 | Status: AC

## 2020-04-24 MED FILL — Armodafinil Tab 250 MG: ORAL | 90 days supply | Qty: 90 | Fill #0 | Status: CN

## 2020-04-24 MED FILL — Dulaglutide Soln Auto-injector 3 MG/0.5ML: SUBCUTANEOUS | 28 days supply | Qty: 2 | Fill #0 | Status: AC

## 2020-05-07 DIAGNOSIS — E11649 Type 2 diabetes mellitus with hypoglycemia without coma: Secondary | ICD-10-CM | POA: Diagnosis not present

## 2020-05-10 ENCOUNTER — Other Ambulatory Visit (HOSPITAL_COMMUNITY): Payer: Self-pay

## 2020-05-10 MED FILL — Armodafinil Tab 250 MG: ORAL | 90 days supply | Qty: 90 | Fill #0 | Status: AC

## 2020-05-20 MED FILL — Dulaglutide Soln Auto-injector 3 MG/0.5ML: SUBCUTANEOUS | 28 days supply | Qty: 2 | Fill #1 | Status: AC

## 2020-05-21 ENCOUNTER — Other Ambulatory Visit (HOSPITAL_COMMUNITY): Payer: Self-pay

## 2020-05-27 ENCOUNTER — Other Ambulatory Visit (HOSPITAL_COMMUNITY): Payer: Self-pay

## 2020-05-27 ENCOUNTER — Other Ambulatory Visit: Payer: Self-pay | Admitting: Family Medicine

## 2020-05-27 DIAGNOSIS — E119 Type 2 diabetes mellitus without complications: Secondary | ICD-10-CM

## 2020-05-27 MED ORDER — LOSARTAN POTASSIUM 25 MG PO TABS
ORAL_TABLET | Freq: Every day | ORAL | 3 refills | Status: DC
  Filled 2020-05-27: qty 90, 90d supply, fill #0
  Filled 2020-08-27: qty 30, 30d supply, fill #1

## 2020-05-27 MED FILL — Levothyroxine Sodium Tab 175 MCG: ORAL | 30 days supply | Qty: 30 | Fill #1 | Status: AC

## 2020-05-31 ENCOUNTER — Other Ambulatory Visit (HOSPITAL_COMMUNITY): Payer: Self-pay

## 2020-06-03 ENCOUNTER — Encounter: Payer: Self-pay | Admitting: Medical

## 2020-06-03 ENCOUNTER — Telehealth (INDEPENDENT_AMBULATORY_CARE_PROVIDER_SITE_OTHER): Payer: 59 | Admitting: Medical

## 2020-06-03 VITALS — Ht 62.0 in | Wt 180.0 lb

## 2020-06-03 DIAGNOSIS — Z9189 Other specified personal risk factors, not elsewhere classified: Secondary | ICD-10-CM | POA: Diagnosis not present

## 2020-06-03 DIAGNOSIS — Z20822 Contact with and (suspected) exposure to covid-19: Secondary | ICD-10-CM

## 2020-06-03 DIAGNOSIS — R6889 Other general symptoms and signs: Secondary | ICD-10-CM | POA: Diagnosis not present

## 2020-06-03 DIAGNOSIS — R0989 Other specified symptoms and signs involving the circulatory and respiratory systems: Secondary | ICD-10-CM | POA: Diagnosis not present

## 2020-06-03 NOTE — Progress Notes (Signed)
Subjective:     Patient ID: Alice Jones, female   DOB: Jul 19, 1960, 60 y.o.   MRN: [id number]  This visit type was conducted due to national recommendations for restrictions regarding the COVID-19 Pandemic (e.g. social distancing) in an effort to limit this patient's exposure and mitigate transmission in our community.  Due to their co-morbid illnesses, this patient is at least at moderate risk for complications without adequate follow up.  This format is felt to be most appropriate for this patient at this time.    Documentation for virtual audio and video telecommunications through [facility] encounter:  The patient was located at home. The provider was located in the office. The patient did consent to this visit and is aware of possible charges through their insurance for this visit.  The other persons participating in this telemedicine service were none. Time spent on call was 20 minutes and in review of previous records 20 minutes total.  This virtual service is not related to other E/M service within previous 7 days.   HPI Chief Complaint  Patient presents with  . Covid Exposure    Spouse tested positive. Home test was negative for patient    Virtual consult for illness.    She reports that her wife has been sick with covid since 3 days ago.  She has been exposed to her wife that is ill.  Today she awoke with scratchy throat today.  No body aches, no sore throat, no chills, no fever, no headache, no NVD, no SOB, no wheezing.   Has minimal symptoms but + exposure.  Home test was negative today.  otherwise in normal state of health. Not sure what to do.  Had both initial covid vaccines and 1 booster.  No other aggravating or relieving factors. No other complaint.   Past Medical History:  Diagnosis Date  . Allergic rhinitis   . Carpal tunnel syndrome   . Complication of anesthesia    pt states difficulty with achieving adequate sedation. ? due to narcolepsy med  .  Diabetes mellitus    Dr. Chalmers Cater  . Herniated cervical disc   . Hyperlipemia   . Hypothyroid   . Narcolepsy   . Thyroid disease     Current Outpatient Medications on File Prior to Visit  Medication Sig Dispense Refill  . ACCU-CHEK SMARTVIEW test strip   11  . Armodafinil 250 MG tablet TAKE 1 TABLET (250 MG TOTAL) BY MOUTH DAILY. 90 tablet 3  . atorvastatin (LIPITOR) 20 MG tablet Take 1 tablet (20 mg total) by mouth daily. 90 tablet 3  . atorvastatin (LIPITOR) 20 MG tablet TAKE 1 TABLET BY MOUTH DAILY. 90 tablet 3  . cholecalciferol (VITAMIN D) 1000 UNITS tablet Take 1,000 Units by mouth daily.    . Coenzyme Q10 (COQ10) 100 MG CAPS Take 1 capsule by mouth daily.    . diphenhydrAMINE (BENADRYL) 25 MG tablet Take 12.5-25 mg by mouth at bedtime.     . Dulaglutide 1.5 MG/0.5ML SOPN INJECT 1.5 MG INTO THE SKIN ONCE A WEEK. 2 mL 4  . Dulaglutide 3 MG/0.5ML SOPN INJECT 3MG  UNDER THE SKIN ONCE A WEEK 2 mL 6  . DYMISTA 137-50 MCG/ACT SUSP Place 1 spray into both nostrils 2 (two) times daily.  6  . Esomeprazole Magnesium (NEXIUM PO) Take 20 mg by mouth daily.     . fish oil-omega-3 fatty acids 1000 MG capsule Take 2 g by mouth daily.    Marland Kitchen HUMALOG 100 UNIT/ML injection     .  insulin lispro (HUMALOG) 100 UNIT/ML injection INJECT 50 UNITS PER DAY VIA PUMP UNDER THE SKIN 60 mL 5  . levothyroxine (SYNTHROID) 175 MCG tablet TAKE 1 TABLET BY MOUTH ONCE DAILY IN THE MORNING ON AN EMPTY STOMACH. 30 tablet 6  . losartan (COZAAR) 25 MG tablet TAKE 1 TABLET BY MOUTH DAILY. 30 tablet 3  . meloxicam (MOBIC) 7.5 MG tablet Take 7.5 mg by mouth daily.    . Multiple Vitamins-Minerals (MULTIVITAMINS) CHEW Chew 1 tablet by mouth daily.    . NON FORMULARY Take 1 capsule by mouth daily. Tumeric    . NON FORMULARY Take 2 capsules by mouth daily. Elysium supplement    . ondansetron (ZOFRAN) 4 MG tablet TAKE 1 TABLET BY MOUTH 3 TIMES A DAY FOR 10 DAYS 30 tablet 2  . TRULICITY 4.13 KG/4.0NU SOPN     . albuterol (PROVENTIL  HFA) 108 (90 Base) MCG/ACT inhaler Inhale 1-2 puffs into the lungs every 6 (six) hours as needed for up to 5 days for wheezing or shortness of breath (SOB). (Patient not taking: Reported on 11/17/2019) 1 Inhaler 0  . ondansetron (ZOFRAN-ODT) 8 MG disintegrating tablet Take 1 tablet (8 mg total) by mouth every 8 (eight) hours as needed for nausea. (Patient not taking: No sig reported) 20 tablet 0  . triamcinolone (KENALOG) 0.1 % paste Use as directed 1 application in the mouth or throat 2 (two) times daily. (Patient not taking: No sig reported) 5 g 0   No current facility-administered medications on file prior to visit.    Review of Systems As in subjective    Objective:   Physical Exam Due to coronavirus pandemic stay at home measures, patient visit was virtual and they were not examined in person.   Ht 5\' 2"  (1.575 m)   Wt 180 lb (81.6 kg)   BMI 32.92 kg/m   Gen: nad Otherwise not examined      Assessment:     Encounter Diagnoses  Name Primary?  . Throat symptom Yes  . At increased risk of exposure to COVID-19 virus        Plan:     We discussed her negative COVID test today, her exposure in the household.  She basically is asymptomatic with exposure to 3 days ago.  We discussed quarantine and possibly repeating COVID test within the next 48 hours.  If she continues to be negative and no symptoms she would return around people after 5 days of quarantine with a negative test along with using a mask for the next 5 days.  However if any new symptoms in the coming days call back and we would be more likely to use Paxlovid given her underlying risk factors  Discussed symptoms to be watchful for.  Jemimah was seen today for covid exposure.  Diagnoses and all orders for this visit:  Throat symptom  At increased risk of exposure to COVID-19 virus  f/u prn

## 2020-06-04 DIAGNOSIS — R059 Cough, unspecified: Secondary | ICD-10-CM

## 2020-06-06 ENCOUNTER — Telehealth: Payer: Self-pay | Admitting: Internal Medicine

## 2020-06-06 ENCOUNTER — Other Ambulatory Visit: Payer: Self-pay | Admitting: Medical

## 2020-06-06 MED ORDER — PAXLOVID 20 X 150 MG & 10 X 100MG PO TBPK: 3.0000 | ORAL_TABLET | Freq: Two times a day (BID) | ORAL | 0 refills | Status: DC

## 2020-06-06 NOTE — Telephone Encounter (Signed)
I had sent her reply back to MyChart this morning.  I did send Paxlovid to the pharmacy, [facility] on [address]  This is the antiviral/monoclonal antibody treatment for high risk individuals.  3 tablets twice daily for 5 days.  Continue to quarantine until symptoms mostly resolved at least another 5 days preferably

## 2020-06-06 NOTE — Telephone Encounter (Signed)
Pt was notified.  

## 2020-06-06 NOTE — Telephone Encounter (Signed)
Pt woke up this morning congested, sore throat and had a fever last night. Does she need to come in to be tested or can you just send in paxlovid to [facility]

## 2020-06-11 ENCOUNTER — Telehealth (INDEPENDENT_AMBULATORY_CARE_PROVIDER_SITE_OTHER): Payer: 59 | Admitting: Family Medicine

## 2020-06-11 ENCOUNTER — Other Ambulatory Visit: Payer: 59

## 2020-06-11 ENCOUNTER — Ambulatory Visit (HOSPITAL_COMMUNITY)
Admission: RE | Admit: 2020-06-11 | Discharge: 2020-06-11 | Disposition: A | Discharge status: Home / Self Care | Payer: 59 | Source: Ambulatory Visit | Attending: Family Medicine | Admitting: Family Medicine

## 2020-06-11 ENCOUNTER — Other Ambulatory Visit: Payer: Self-pay

## 2020-06-11 DIAGNOSIS — R059 Cough, unspecified: Secondary | ICD-10-CM | POA: Insufficient documentation

## 2020-06-11 DIAGNOSIS — U071 COVID-19: Secondary | ICD-10-CM | POA: Diagnosis not present

## 2020-06-11 DIAGNOSIS — R0602 Shortness of breath: Secondary | ICD-10-CM | POA: Diagnosis not present

## 2020-06-11 NOTE — Progress Notes (Signed)
   Subjective:    Patient ID: Alice Jones, female    DOB: Dec 28, 1960, 60 y.o.   MRN: [id number]  HPI Documentation for virtual audio and video telecommunications through [facility] encounter: The patient was located at home. 2 patient identifiers used.  The provider was located in the office. The patient did consent to this visit and is aware of possible charges through their insurance for this visit. The other persons participating in this telemedicine service were none. Time spent on call was 5 minutes and in review of previous records >20 minutes total for counseling and coordination of care. This virtual service is not related to other E/M service within previous 7 days. She was seen by Audelia Acton and started on Paxlovid for presumed COVID as her wife was diagnosed with COVID.  She tolerated this well but is continued had difficulty with coughing and congestion as well as malaise.  Review of Systems     Objective:   Physical Exam COVID test done here to verify the diagnosis which was positive.       Assessment & Plan:  [id number] Since she is having more difficulty with cough and congestion in spite of antiviral medication, I will order an x-ray to further evaluate this.

## 2020-06-26 ENCOUNTER — Other Ambulatory Visit: Payer: Self-pay

## 2020-06-26 ENCOUNTER — Other Ambulatory Visit (HOSPITAL_COMMUNITY): Payer: Self-pay

## 2020-06-26 MED ORDER — TRULICITY 3 MG/0.5ML ~~LOC~~ SOAJ
SUBCUTANEOUS | 6 refills | Status: DC
  Filled 2020-06-26: qty 6, 84d supply, fill #0
  Filled 2020-09-19: qty 6, 84d supply, fill #1

## 2020-06-26 MED FILL — Levothyroxine Sodium Tab 175 MCG: ORAL | 30 days supply | Qty: 30 | Fill #2 | Status: AC

## 2020-06-27 ENCOUNTER — Other Ambulatory Visit (HOSPITAL_COMMUNITY): Payer: Self-pay

## 2020-06-27 MED ORDER — INSULIN LISPRO 100 UNIT/ML IJ SOLN
INTRAMUSCULAR | 5 refills | Status: DC
Start: 2020-06-27 — End: 2021-10-20
  Filled 2020-06-27: qty 40, 80d supply, fill #0
  Filled 2020-10-24: qty 40, 80d supply, fill #1
  Filled 2021-02-21: qty 40, 80d supply, fill #2
  Filled 2021-06-24: qty 40, 80d supply, fill #3

## 2020-07-01 ENCOUNTER — Other Ambulatory Visit (HOSPITAL_COMMUNITY): Payer: Self-pay

## 2020-07-01 DIAGNOSIS — E039 Hypothyroidism, unspecified: Secondary | ICD-10-CM | POA: Diagnosis not present

## 2020-07-01 DIAGNOSIS — Z9641 Presence of insulin pump (external) (internal): Secondary | ICD-10-CM | POA: Diagnosis not present

## 2020-07-01 DIAGNOSIS — E1165 Type 2 diabetes mellitus with hyperglycemia: Secondary | ICD-10-CM | POA: Diagnosis not present

## 2020-07-01 DIAGNOSIS — M19049 Primary osteoarthritis, unspecified hand: Secondary | ICD-10-CM | POA: Diagnosis not present

## 2020-07-01 DIAGNOSIS — E7800 Pure hypercholesterolemia, unspecified: Secondary | ICD-10-CM | POA: Diagnosis not present

## 2020-07-01 MED ORDER — CEPHALEXIN 500 MG PO CAPS
500.0000 mg | ORAL_CAPSULE | Freq: Two times a day (BID) | ORAL | 1 refills | Status: DC
  Filled 2020-07-01: qty 14, 7d supply, fill #0

## 2020-07-11 DIAGNOSIS — Z794 Long term (current) use of insulin: Secondary | ICD-10-CM | POA: Diagnosis not present

## 2020-07-11 DIAGNOSIS — E1165 Type 2 diabetes mellitus with hyperglycemia: Secondary | ICD-10-CM | POA: Diagnosis not present

## 2020-07-29 ENCOUNTER — Other Ambulatory Visit (HOSPITAL_COMMUNITY): Payer: Self-pay

## 2020-07-30 ENCOUNTER — Other Ambulatory Visit (HOSPITAL_COMMUNITY): Payer: Self-pay

## 2020-07-30 ENCOUNTER — Other Ambulatory Visit: Payer: Self-pay | Admitting: Family Medicine

## 2020-07-30 DIAGNOSIS — G47419 Narcolepsy without cataplexy: Secondary | ICD-10-CM

## 2020-07-30 DIAGNOSIS — E785 Hyperlipidemia, unspecified: Secondary | ICD-10-CM

## 2020-07-30 MED FILL — Levothyroxine Sodium Tab 175 MCG: ORAL | 30 days supply | Qty: 30 | Fill #3 | Status: AC

## 2020-07-30 NOTE — Telephone Encounter (Signed)
Pt requested to have med filled for a year. Last OV was 11/17/19 for diabetes and med check. Please advise [facility]

## 2020-07-31 ENCOUNTER — Other Ambulatory Visit (HOSPITAL_COMMUNITY): Payer: Self-pay

## 2020-07-31 MED FILL — Atorvastatin Calcium Tab 20 MG (Base Equivalent): ORAL | 90 days supply | Qty: 90 | Fill #0 | Status: AC

## 2020-08-02 ENCOUNTER — Telehealth: Payer: Self-pay

## 2020-08-02 ENCOUNTER — Other Ambulatory Visit (HOSPITAL_COMMUNITY): Payer: Self-pay

## 2020-08-02 ENCOUNTER — Other Ambulatory Visit: Payer: Self-pay | Admitting: Medical

## 2020-08-02 DIAGNOSIS — G47419 Narcolepsy without cataplexy: Secondary | ICD-10-CM

## 2020-08-02 DIAGNOSIS — E11649 Type 2 diabetes mellitus with hypoglycemia without coma: Secondary | ICD-10-CM | POA: Diagnosis not present

## 2020-08-02 MED ORDER — ARMODAFINIL 250 MG PO TABS
250.0000 mg | ORAL_TABLET | Freq: Every day | ORAL | 0 refills | Status: DC
  Filled 2020-08-02 – 2020-08-06 (×2): qty 90, 90d supply, fill #0

## 2020-08-02 NOTE — Telephone Encounter (Signed)
Pt. Called stating she needs her Armodafinil refilled last apt was 06/11/20 and next apt is 11/29/20 to [place]. Pharmacy. Dr. Redmond School sent it in on 07/31/20 but sent it in as PRINT by accident. If you could resend this. Thank you.

## 2020-08-06 ENCOUNTER — Other Ambulatory Visit (HOSPITAL_COMMUNITY): Payer: Self-pay

## 2020-08-27 MED FILL — Levothyroxine Sodium Tab 175 MCG: ORAL | 30 days supply | Qty: 30 | Fill #4 | Status: AC

## 2020-08-28 ENCOUNTER — Other Ambulatory Visit (HOSPITAL_COMMUNITY): Payer: Self-pay

## 2020-09-02 ENCOUNTER — Ambulatory Visit: Payer: 59 | Admitting: Family Medicine

## 2020-09-02 ENCOUNTER — Other Ambulatory Visit: Payer: Self-pay

## 2020-09-02 ENCOUNTER — Encounter: Payer: Self-pay | Admitting: Family Medicine

## 2020-09-02 VITALS — BP 132/74 | HR 71 | Temp 97.7°F | Wt 181.4 lb

## 2020-09-02 DIAGNOSIS — H8110 Benign paroxysmal vertigo, unspecified ear: Secondary | ICD-10-CM | POA: Diagnosis not present

## 2020-09-02 NOTE — Patient Instructions (Signed)
How to Perform the Epley Maneuver The Epley maneuver is an exercise that relieves symptoms of vertigo. Vertigo is the feeling that you or your surroundings are moving when they are not. When you feel vertigo, you may feel like the room is spinning and may have trouble walking. The Epley maneuver is used for a type of vertigo caused by a calcium deposit in a part of the inner ear. The maneuver involves changing headpositions to help the deposit move out of the area. You can do this maneuver at home whenever you have symptoms of vertigo. You canrepeat it in 24 hours if your vertigo has not gone away. Even though the Epley maneuver may relieve your vertigo for a few weeks, it is possible that your symptoms will return. This maneuver relieves vertigo, but itdoes not relieve dizziness. What are the risks? If it is done correctly, the Epley maneuver is considered safe. Sometimes it can lead to dizziness or nausea that goes away after a short time. If you develop other symptoms--such as changes in vision, weakness, or numbness--stopdoing the maneuver and call your health care provider. Supplies needed: A bed or table. A pillow. How to do the Epley maneuver     Sit on the edge of a bed or table with your back straight and your legs extended or hanging over the edge of the bed or table. Turn your head halfway toward the affected ear or side as told by your health care provider. Lie backward quickly with your head turned until you are lying flat on your back. Your head should dangle (head-hanging position). You may want to position a pillow under your shoulders. Hold this position for at least 30 seconds. If you feel dizzy or have symptoms of vertigo, continue to hold the position until the symptoms stop. Turn your head to the opposite direction until your unaffected ear is facing down. Your head should continue to dangle. Hold this position for at least 30 seconds. If you feel dizzy or have symptoms of  vertigo, continue to hold the position until the symptoms stop. Turn your whole body to the same side as your head so that you are positioned on your side. Your head will now be nearly facedown and no longer needs to dangle. Hold for at least 30 seconds. If you feel dizzy or have symptoms of vertigo, continue to hold the position until the symptoms stop. Sit back up. You can repeat the maneuver in 24 hours if your vertigo does not go away. Follow these instructions at home: For 24 hours after doing the Epley maneuver: Keep your head in an upright position. When lying down to sleep or rest, keep your head raised (elevated) with two or more pillows. Avoid excessive neck movements. Activity Do not drive or use machinery if you feel dizzy. After doing the Epley maneuver, return to your normal activities as told by your health care provider. Ask your health care provider what activities are safe for you. General instructions Drink enough fluid to keep your urine pale yellow. Do not drink alcohol. Take over-the-counter and prescription medicines only as told by your health care provider. Keep all follow-up visits. This is important. Preventing vertigo symptoms Ask your health care provider if there is anything you should do at home to prevent vertigo. He or she may recommend that you: Keep your head elevated with two or more pillows while you sleep. Do not sleep on the side of your affected ear. Get up slowly from bed.   Avoid sudden movements during the day. Avoid extreme head positions or movement, such as looking up or bending over. Contact a health care provider if: Your vertigo gets worse. You have other symptoms, including: Nausea. Vomiting. Headache. Get help right away if you: Have vision changes. Have a headache or neck pain that is severe or getting worse. Cannot stop vomiting. Have new numbness or weakness in any part of your body. These symptoms may represent a serious problem  that is an emergency. Do not wait to see if the symptoms will go away. Get medical help right away. Call your local emergency services (911 in the [place] Do not drive yourself to the hospital. Summary Vertigo is the feeling that you or your surroundings are moving when they are not. The Epley maneuver is an exercise that relieves symptoms of vertigo. If the Epley maneuver is done correctly, it is considered safe. This information is not intended to replace advice given to you by your health care provider. Make sure you discuss any questions you have with your healthcare provider. Document Revised: 12/06/2019 Document Reviewed: 12/06/2019 Elsevier Patient Education  2022 [employer] Inc.  

## 2020-09-02 NOTE — Progress Notes (Signed)
   Subjective:    Patient ID: Alice Jones, female    DOB: Jun 18, 1960, 60 y.o.   MRN: [id number]  HPI She states that on Saturday she developed sudden's onset of dizziness and nausea with a couple episodes of vomiting.  She did fairly well on Sunday but is still having difficulty today.  She notes especially dizziness is when she looks down.  No blurred or double vision, weakness numbness or tingling.   Review of Systems     Objective:   Physical Exam Alert and in no distress.  Dizziness reoccurred when looking down.  EOMI.  Other cranial nerves grossly intact.  No carotid bruits noted.       Assessment & Plan:  Benign paroxysmal positional vertigo, unspecified laterality I explained what the problem is and the fact that Epley maneuvers will be very beneficial.  Information given concerning the maneuvers.  She has no luck doing this on her own after couple of days, she will call and I will refer her for further therapy.  She was comfortable with that.

## 2020-09-12 ENCOUNTER — Other Ambulatory Visit (HOSPITAL_BASED_OUTPATIENT_CLINIC_OR_DEPARTMENT_OTHER): Payer: Self-pay

## 2020-09-12 ENCOUNTER — Other Ambulatory Visit (HOSPITAL_COMMUNITY): Payer: Self-pay

## 2020-09-12 MED ORDER — OMNIPOD 5 DEXG7G6 PODS GEN 5 MISC
6 refills | Status: DC
  Filled 2020-09-12 (×2): qty 10, 30d supply, fill #0
  Filled 2021-01-07: qty 10, 30d supply, fill #1
  Filled 2021-02-06: qty 10, 30d supply, fill #2
  Filled 2021-03-12: qty 5, 15d supply, fill #3
  Filled 2021-03-25: qty 5, 15d supply, fill #4
  Filled 2021-04-08: qty 5, 15d supply, fill #5
  Filled 2021-04-24: qty 10, 30d supply, fill #6
  Filled 2021-05-23: qty 10, 30d supply, fill #7
  Filled 2021-06-24: qty 10, 30d supply, fill #8

## 2020-09-12 MED ORDER — OMNIPOD 5 DEXG7G6 INTRO GEN 5 KIT
PACK | 0 refills | Status: AC
  Filled 2020-09-12: qty 1, 90d supply, fill #0
  Filled 2020-09-12: qty 1, 30d supply, fill #0
  Filled 2020-10-25: qty 1, 1d supply, fill #0

## 2020-09-16 ENCOUNTER — Other Ambulatory Visit (HOSPITAL_COMMUNITY): Payer: Self-pay

## 2020-09-20 ENCOUNTER — Other Ambulatory Visit (HOSPITAL_COMMUNITY): Payer: Self-pay

## 2020-09-30 ENCOUNTER — Other Ambulatory Visit: Payer: Self-pay

## 2020-09-30 ENCOUNTER — Other Ambulatory Visit (HOSPITAL_COMMUNITY): Payer: Self-pay

## 2020-09-30 ENCOUNTER — Other Ambulatory Visit: Payer: Self-pay | Admitting: Family Medicine

## 2020-09-30 DIAGNOSIS — E119 Type 2 diabetes mellitus without complications: Secondary | ICD-10-CM

## 2020-09-30 MED ORDER — LEVOTHYROXINE SODIUM 175 MCG PO TABS
175.0000 ug | ORAL_TABLET | Freq: Every morning | ORAL | 12 refills | Status: DC
  Filled 2020-09-30: qty 26, 30d supply, fill #0
  Filled 2020-10-25: qty 26, 30d supply, fill #1
  Filled 2020-11-25: qty 26, 30d supply, fill #2

## 2020-10-01 ENCOUNTER — Other Ambulatory Visit (HOSPITAL_COMMUNITY): Payer: Self-pay

## 2020-10-01 DIAGNOSIS — E1165 Type 2 diabetes mellitus with hyperglycemia: Secondary | ICD-10-CM | POA: Diagnosis not present

## 2020-10-01 DIAGNOSIS — Z794 Long term (current) use of insulin: Secondary | ICD-10-CM | POA: Diagnosis not present

## 2020-10-01 MED ORDER — LOSARTAN POTASSIUM 25 MG PO TABS
25.0000 mg | ORAL_TABLET | Freq: Every day | ORAL | 3 refills | Status: DC
  Filled 2020-10-01: qty 30, 30d supply, fill #0
  Filled 2020-10-24 – 2020-11-04 (×2): qty 30, 30d supply, fill #1
  Filled 2020-11-25: qty 30, 30d supply, fill #2

## 2020-10-24 ENCOUNTER — Other Ambulatory Visit: Payer: Self-pay | Admitting: Medical

## 2020-10-24 ENCOUNTER — Other Ambulatory Visit (HOSPITAL_COMMUNITY): Payer: Self-pay

## 2020-10-24 DIAGNOSIS — G47419 Narcolepsy without cataplexy: Secondary | ICD-10-CM

## 2020-10-24 MED ORDER — ARMODAFINIL 250 MG PO TABS
250.0000 mg | ORAL_TABLET | Freq: Every day | ORAL | 0 refills | Status: DC
  Filled 2020-10-24 – 2020-11-04 (×2): qty 90, 90d supply, fill #0

## 2020-10-24 MED FILL — Atorvastatin Calcium Tab 20 MG (Base Equivalent): ORAL | 90 days supply | Qty: 90 | Fill #1 | Status: AC

## 2020-10-25 ENCOUNTER — Other Ambulatory Visit (HOSPITAL_COMMUNITY): Payer: Self-pay

## 2020-10-28 ENCOUNTER — Other Ambulatory Visit (HOSPITAL_COMMUNITY): Payer: Self-pay

## 2020-11-04 ENCOUNTER — Other Ambulatory Visit (HOSPITAL_COMMUNITY): Payer: Self-pay

## 2020-11-08 ENCOUNTER — Other Ambulatory Visit (HOSPITAL_COMMUNITY): Payer: Self-pay

## 2020-11-08 DIAGNOSIS — E1165 Type 2 diabetes mellitus with hyperglycemia: Secondary | ICD-10-CM | POA: Diagnosis not present

## 2020-11-08 DIAGNOSIS — E039 Hypothyroidism, unspecified: Secondary | ICD-10-CM | POA: Diagnosis not present

## 2020-11-08 DIAGNOSIS — E7800 Pure hypercholesterolemia, unspecified: Secondary | ICD-10-CM | POA: Diagnosis not present

## 2020-11-08 DIAGNOSIS — Z9641 Presence of insulin pump (external) (internal): Secondary | ICD-10-CM | POA: Diagnosis not present

## 2020-11-08 LAB — HEMOGLOBIN A1C: Hemoglobin A1C: 7.5

## 2020-11-08 MED ORDER — MOUNJARO 5 MG/0.5ML ~~LOC~~ SOAJ
5.0000 mg | SUBCUTANEOUS | 6 refills | Status: DC
  Filled 2020-11-08: qty 2, 28d supply, fill #0
  Filled 2020-12-04: qty 2, 28d supply, fill #1
  Filled 2021-01-07: qty 2, 28d supply, fill #2

## 2020-11-15 DIAGNOSIS — M79641 Pain in right hand: Secondary | ICD-10-CM | POA: Diagnosis not present

## 2020-11-15 DIAGNOSIS — M13841 Other specified arthritis, right hand: Secondary | ICD-10-CM | POA: Diagnosis not present

## 2020-11-21 DIAGNOSIS — G5603 Carpal tunnel syndrome, bilateral upper limbs: Secondary | ICD-10-CM | POA: Diagnosis not present

## 2020-11-21 DIAGNOSIS — G5602 Carpal tunnel syndrome, left upper limb: Secondary | ICD-10-CM | POA: Diagnosis not present

## 2020-11-25 ENCOUNTER — Other Ambulatory Visit (HOSPITAL_COMMUNITY): Payer: Self-pay

## 2020-11-27 ENCOUNTER — Other Ambulatory Visit (HOSPITAL_COMMUNITY): Payer: Self-pay

## 2020-11-28 ENCOUNTER — Other Ambulatory Visit (HOSPITAL_COMMUNITY): Payer: Self-pay

## 2020-11-29 ENCOUNTER — Encounter: Payer: Self-pay | Admitting: *Deleted

## 2020-11-29 ENCOUNTER — Other Ambulatory Visit: Payer: Self-pay

## 2020-11-29 ENCOUNTER — Other Ambulatory Visit (HOSPITAL_COMMUNITY): Payer: Self-pay

## 2020-11-29 ENCOUNTER — Encounter: Payer: 59 | Admitting: Family Medicine

## 2020-11-29 ENCOUNTER — Ambulatory Visit (INDEPENDENT_AMBULATORY_CARE_PROVIDER_SITE_OTHER): Payer: 59 | Admitting: Family Medicine

## 2020-11-29 VITALS — BP 122/72 | HR 72 | Ht 61.0 in | Wt 179.0 lb

## 2020-11-29 DIAGNOSIS — Z23 Encounter for immunization: Secondary | ICD-10-CM | POA: Diagnosis not present

## 2020-11-29 DIAGNOSIS — J309 Allergic rhinitis, unspecified: Secondary | ICD-10-CM

## 2020-11-29 DIAGNOSIS — E785 Hyperlipidemia, unspecified: Secondary | ICD-10-CM

## 2020-11-29 DIAGNOSIS — E669 Obesity, unspecified: Secondary | ICD-10-CM | POA: Diagnosis not present

## 2020-11-29 DIAGNOSIS — Z0000 Encounter for general adult medical examination without abnormal findings: Secondary | ICD-10-CM

## 2020-11-29 DIAGNOSIS — G5602 Carpal tunnel syndrome, left upper limb: Secondary | ICD-10-CM | POA: Diagnosis not present

## 2020-11-29 DIAGNOSIS — E119 Type 2 diabetes mellitus without complications: Secondary | ICD-10-CM

## 2020-11-29 DIAGNOSIS — L501 Idiopathic urticaria: Secondary | ICD-10-CM | POA: Diagnosis not present

## 2020-11-29 DIAGNOSIS — G47419 Narcolepsy without cataplexy: Secondary | ICD-10-CM | POA: Diagnosis not present

## 2020-11-29 DIAGNOSIS — M25531 Pain in right wrist: Secondary | ICD-10-CM | POA: Diagnosis not present

## 2020-11-29 DIAGNOSIS — H4020X4 Unspecified primary angle-closure glaucoma, indeterminate stage: Secondary | ICD-10-CM

## 2020-11-29 DIAGNOSIS — E039 Hypothyroidism, unspecified: Secondary | ICD-10-CM

## 2020-11-29 LAB — POCT UA - MICROALBUMIN
Albumin/Creatinine Ratio, Urine, POC: 3.6
Creatinine, POC: 138.9 mg/dL
Microalbumin Ur, POC: 5 mg/L

## 2020-11-29 MED ORDER — ARMODAFINIL 250 MG PO TABS
250.0000 mg | ORAL_TABLET | Freq: Every day | ORAL | 3 refills | Status: DC
  Filled 2020-11-29 – 2021-02-06 (×3): qty 90, 90d supply, fill #0
  Filled 2021-04-24: qty 90, 90d supply, fill #1
  Filled 2021-05-02: qty 30, 30d supply, fill #1
  Filled 2021-05-02: qty 30, 30d supply, fill #2
  Filled 2021-05-02 (×2): qty 60, 60d supply, fill #1
  Filled 2021-05-02: qty 30, 30d supply, fill #1

## 2020-11-29 MED ORDER — LOSARTAN POTASSIUM 25 MG PO TABS
25.0000 mg | ORAL_TABLET | Freq: Every day | ORAL | 3 refills | Status: DC
  Filled 2020-11-29 – 2020-12-25 (×2): qty 90, 90d supply, fill #0
  Filled 2021-03-25: qty 90, 90d supply, fill #1
  Filled 2021-06-24: qty 90, 90d supply, fill #2
  Filled 2021-09-25 – 2021-09-26 (×2): qty 90, 90d supply, fill #3

## 2020-11-29 MED ORDER — ATORVASTATIN CALCIUM 20 MG PO TABS
20.0000 mg | ORAL_TABLET | Freq: Every day | ORAL | 3 refills | Status: DC
Start: 2020-11-29 — End: 2022-01-02
  Filled 2020-11-29 – 2021-01-30 (×2): qty 90, 90d supply, fill #0
  Filled 2021-04-24: qty 90, 90d supply, fill #1
  Filled 2021-07-29: qty 90, 90d supply, fill #2
  Filled 2021-10-20: qty 90, 90d supply, fill #3

## 2020-11-29 NOTE — Progress Notes (Signed)
   Subjective:    Patient ID: Alice Jones, female    DOB: 10/02/1960, 60 y.o.   MRN: [id number]  HPI She is here for complete examination.  She does have underlying diabetes and is on Dexcom pump.  She also is now on [facility] and seems to like its effect.  Her allergies seem to be under good control.  She did stop getting the injections.  She continues on her losartan as well as her levothyroxine and atorvastatin.  Having no difficulty within those medications.  Continues on armodafinil for her narcolepsy type symptoms.  She sees ophthalmology regularly for her diabetes as well as for her glaucoma.  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  She does have a history of urticaria but does not complain of much difficulty with that.  She is up-to-date on her mammograms and Pap smear.  Work and home life are going well.  Family and social history as well as health maintenance and immunizations was reviewed.   Review of Systems  All other systems reviewed and are negative.     Objective:   Physical Exam Alert and in no distress. Tympanic membranes and canals are normal. Pharyngeal area is normal. Neck is supple without adenopathy or thyromegaly. Cardiac exam shows a regular sinus rhythm without murmurs or gallops. Lungs are clear to auscultation.  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  Type 2 diabetes mellitus without complication, without long-term current use of insulin (HCC) - Plan: losartan (COZAAR) 25 MG tablet, CBC with Differential/Platelet, Comprehensive metabolic panel,  Lipid panel, POCT UA - Microalbumin  Hyperlipidemia, unspecified hyperlipidemia type - Plan: atorvastatin (LIPITOR) 20 MG tablet  Narcolepsy without cataplexy - Plan: Armodafinil 250 MG tablet  Hypothyroidism, unspecified type  Chronic idiopathic urticaria  Allergic rhinitis, unspecified seasonality, unspecified trigger  Obesity (BMI 30.0-34.9)  Angle-closure glaucoma, indeterminate stage She will continue on her present medication regimen and follow-up with Dr. Debbora Presto for her diabetes.  She will also see ophthalmology on a regular basis.  Blood work will be sent to Dr. Debbora Presto.

## 2020-11-30 LAB — CBC WITH DIFFERENTIAL/PLATELET
Basophils Absolute: 0 10*3/uL (ref 0.0–0.2)
Basos: 0 %
EOS (ABSOLUTE): 0.2 10*3/uL (ref 0.0–0.4)
Eos: 2 %
Hematocrit: 43.3 % (ref 34.0–46.6)
Hemoglobin: 14.8 g/dL (ref 11.1–15.9)
Immature Grans (Abs): 0 10*3/uL (ref 0.0–0.1)
Immature Granulocytes: 0 %
Lymphocytes Absolute: 2.7 10*3/uL (ref 0.7–3.1)
Lymphs: 29 %
MCH: 30.5 pg (ref 26.6–33.0)
MCHC: 34.2 g/dL (ref 31.5–35.7)
MCV: 89 fL (ref 79–97)
Monocytes Absolute: 0.6 10*3/uL (ref 0.1–0.9)
Monocytes: 6 %
Neutrophils Absolute: 5.6 10*3/uL (ref 1.4–7.0)
Neutrophils: 63 %
Platelets: 335 10*3/uL (ref 150–450)
RBC: 4.85 x10E6/uL (ref 3.77–5.28)
RDW: 12.4 % (ref 11.7–15.4)
WBC: 9.1 10*3/uL (ref 3.4–10.8)

## 2020-11-30 LAB — COMPREHENSIVE METABOLIC PANEL
ALT: 24 IU/L (ref 0–32)
AST: 15 IU/L (ref 0–40)
Albumin/Globulin Ratio: 2 (ref 1.2–2.2)
Albumin: 4.3 g/dL (ref 3.8–4.9)
Alkaline Phosphatase: 152 IU/L — ABNORMAL HIGH (ref 44–121)
BUN/Creatinine Ratio: 16 (ref 9–23)
BUN: 14 mg/dL (ref 6–24)
Bilirubin Total: 0.3 mg/dL (ref 0.0–1.2)
CO2: 24 mmol/L (ref 20–29)
Calcium: 9.7 mg/dL (ref 8.7–10.2)
Chloride: 104 mmol/L (ref 96–106)
Creatinine, Ser: 0.87 mg/dL (ref 0.57–1.00)
Globulin, Total: 2.1 g/dL (ref 1.5–4.5)
Glucose: 187 mg/dL — ABNORMAL HIGH (ref 70–99)
Potassium: 4.9 mmol/L (ref 3.5–5.2)
Sodium: 140 mmol/L (ref 134–144)
Total Protein: 6.4 g/dL (ref 6.0–8.5)
eGFR: 77 mL/min/{1.73_m2} (ref >59)

## 2020-11-30 LAB — LIPID PANEL
Chol/HDL Ratio: 2 ratio (ref 0.0–4.4)
Cholesterol, Total: 152 mg/dL (ref 100–199)
HDL: 77 mg/dL (ref >39)
LDL Chol Calc (NIH): 55 mg/dL (ref 0–99)
Triglycerides: 118 mg/dL (ref 0–149)
VLDL Cholesterol Cal: 20 mg/dL (ref 5–40)

## 2020-12-05 ENCOUNTER — Other Ambulatory Visit (HOSPITAL_COMMUNITY): Payer: Self-pay

## 2020-12-06 ENCOUNTER — Other Ambulatory Visit (HOSPITAL_COMMUNITY): Payer: Self-pay

## 2020-12-07 DIAGNOSIS — M25531 Pain in right wrist: Secondary | ICD-10-CM | POA: Diagnosis not present

## 2020-12-17 DIAGNOSIS — M79641 Pain in right hand: Secondary | ICD-10-CM | POA: Diagnosis not present

## 2020-12-25 ENCOUNTER — Other Ambulatory Visit: Payer: Self-pay

## 2020-12-25 ENCOUNTER — Other Ambulatory Visit (HOSPITAL_COMMUNITY): Payer: Self-pay

## 2020-12-25 DIAGNOSIS — E1165 Type 2 diabetes mellitus with hyperglycemia: Secondary | ICD-10-CM | POA: Diagnosis not present

## 2020-12-25 DIAGNOSIS — Z794 Long term (current) use of insulin: Secondary | ICD-10-CM | POA: Diagnosis not present

## 2020-12-25 MED ORDER — LEVOTHYROXINE SODIUM 175 MCG PO TABS
175.0000 ug | ORAL_TABLET | Freq: Every morning | ORAL | 4 refills | Status: DC
  Filled 2020-12-25: qty 90, 90d supply, fill #0
  Filled 2021-03-25: qty 90, 90d supply, fill #1
  Filled 2021-06-24: qty 90, 90d supply, fill #2
  Filled 2021-09-25 – 2021-09-26 (×2): qty 90, 90d supply, fill #3

## 2020-12-26 DIAGNOSIS — M5412 Radiculopathy, cervical region: Secondary | ICD-10-CM | POA: Diagnosis not present

## 2020-12-26 DIAGNOSIS — Z6832 Body mass index (BMI) 32.0-32.9, adult: Secondary | ICD-10-CM | POA: Diagnosis not present

## 2020-12-26 DIAGNOSIS — R030 Elevated blood-pressure reading, without diagnosis of hypertension: Secondary | ICD-10-CM | POA: Diagnosis not present

## 2020-12-27 ENCOUNTER — Other Ambulatory Visit (HOSPITAL_COMMUNITY): Payer: Self-pay

## 2020-12-31 ENCOUNTER — Other Ambulatory Visit (HOSPITAL_COMMUNITY): Payer: Self-pay

## 2021-01-06 ENCOUNTER — Other Ambulatory Visit (HOSPITAL_COMMUNITY): Payer: Self-pay

## 2021-01-06 MED ORDER — MOUNJARO 7.5 MG/0.5ML ~~LOC~~ SOAJ
7.5000 mg | SUBCUTANEOUS | 6 refills | Status: AC
  Filled 2021-01-06: qty 2, 28d supply, fill #0

## 2021-01-06 MED ORDER — MOUNJARO 5 MG/0.5ML ~~LOC~~ SOAJ
5.0000 mg | SUBCUTANEOUS | 5 refills | Status: AC
  Filled 2021-01-06: qty 2, 28d supply, fill #0

## 2021-01-07 ENCOUNTER — Other Ambulatory Visit (HOSPITAL_COMMUNITY): Payer: Self-pay

## 2021-01-09 ENCOUNTER — Other Ambulatory Visit (HOSPITAL_COMMUNITY): Payer: Self-pay

## 2021-01-16 ENCOUNTER — Other Ambulatory Visit (HOSPITAL_COMMUNITY): Payer: Self-pay

## 2021-01-16 DIAGNOSIS — M654 Radial styloid tenosynovitis [de Quervain]: Secondary | ICD-10-CM | POA: Diagnosis not present

## 2021-01-24 DIAGNOSIS — M654 Radial styloid tenosynovitis [de Quervain]: Secondary | ICD-10-CM | POA: Diagnosis not present

## 2021-01-31 ENCOUNTER — Other Ambulatory Visit (HOSPITAL_COMMUNITY): Payer: Self-pay

## 2021-02-06 ENCOUNTER — Other Ambulatory Visit (HOSPITAL_COMMUNITY): Payer: Self-pay

## 2021-02-21 ENCOUNTER — Encounter: Payer: Self-pay | Admitting: Family Medicine

## 2021-02-21 ENCOUNTER — Other Ambulatory Visit (HOSPITAL_COMMUNITY): Payer: Self-pay

## 2021-02-21 DIAGNOSIS — H40033 Anatomical narrow angle, bilateral: Secondary | ICD-10-CM | POA: Diagnosis not present

## 2021-02-21 DIAGNOSIS — H11823 Conjunctivochalasis, bilateral: Secondary | ICD-10-CM | POA: Diagnosis not present

## 2021-02-21 DIAGNOSIS — E039 Hypothyroidism, unspecified: Secondary | ICD-10-CM | POA: Diagnosis not present

## 2021-02-21 DIAGNOSIS — I10 Essential (primary) hypertension: Secondary | ICD-10-CM | POA: Diagnosis not present

## 2021-02-21 DIAGNOSIS — E119 Type 2 diabetes mellitus without complications: Secondary | ICD-10-CM | POA: Diagnosis not present

## 2021-02-21 DIAGNOSIS — E7800 Pure hypercholesterolemia, unspecified: Secondary | ICD-10-CM | POA: Diagnosis not present

## 2021-02-21 DIAGNOSIS — H2513 Age-related nuclear cataract, bilateral: Secondary | ICD-10-CM | POA: Diagnosis not present

## 2021-02-21 DIAGNOSIS — Z9641 Presence of insulin pump (external) (internal): Secondary | ICD-10-CM | POA: Diagnosis not present

## 2021-02-21 DIAGNOSIS — E1165 Type 2 diabetes mellitus with hyperglycemia: Secondary | ICD-10-CM | POA: Diagnosis not present

## 2021-02-21 MED ORDER — MOUNJARO 7.5 MG/0.5ML ~~LOC~~ SOAJ: 7.5000 mg | SUBCUTANEOUS | 6 refills | Status: AC

## 2021-02-21 MED ORDER — MOUNJARO 7.5 MG/0.5ML ~~LOC~~ SOAJ
7.5000 mg | SUBCUTANEOUS | 6 refills | Status: DC
Start: 2021-02-21 — End: 2021-04-28
  Filled 2021-02-21: qty 2, 28d supply, fill #0
  Filled 2021-03-20: qty 2, 28d supply, fill #1

## 2021-03-12 ENCOUNTER — Other Ambulatory Visit (HOSPITAL_COMMUNITY): Payer: Self-pay

## 2021-03-20 ENCOUNTER — Other Ambulatory Visit (HOSPITAL_COMMUNITY): Payer: Self-pay

## 2021-03-21 DIAGNOSIS — Z794 Long term (current) use of insulin: Secondary | ICD-10-CM | POA: Diagnosis not present

## 2021-03-21 DIAGNOSIS — E1165 Type 2 diabetes mellitus with hyperglycemia: Secondary | ICD-10-CM | POA: Diagnosis not present

## 2021-03-25 ENCOUNTER — Other Ambulatory Visit (HOSPITAL_COMMUNITY): Payer: Self-pay

## 2021-04-08 ENCOUNTER — Other Ambulatory Visit (HOSPITAL_COMMUNITY): Payer: Self-pay

## 2021-04-24 ENCOUNTER — Other Ambulatory Visit (HOSPITAL_COMMUNITY): Payer: Self-pay

## 2021-04-25 ENCOUNTER — Other Ambulatory Visit (HOSPITAL_COMMUNITY): Payer: Self-pay

## 2021-04-25 ENCOUNTER — Other Ambulatory Visit: Payer: Self-pay

## 2021-04-28 ENCOUNTER — Other Ambulatory Visit (HOSPITAL_COMMUNITY): Payer: Self-pay

## 2021-04-28 MED ORDER — MOUNJARO 7.5 MG/0.5ML ~~LOC~~ SOPN
7.5000 mg | PEN_INJECTOR | SUBCUTANEOUS | 6 refills | Status: AC
Start: 2021-04-28 — End: ?
  Filled 2021-04-28: qty 2, 28d supply, fill #0

## 2021-04-29 ENCOUNTER — Other Ambulatory Visit (HOSPITAL_COMMUNITY): Payer: Self-pay

## 2021-04-29 MED ORDER — MOUNJARO 10 MG/0.5ML ~~LOC~~ SOAJ
10.0000 mg | SUBCUTANEOUS | 3 refills | Status: AC
  Filled 2021-04-29: qty 2, 28d supply, fill #0
  Filled 2021-05-26: qty 2, 28d supply, fill #1
  Filled 2021-06-24: qty 2, 28d supply, fill #2
  Filled 2021-07-29: qty 6, 84d supply, fill #3
  Filled 2021-10-20: qty 6, 84d supply, fill #4
  Filled 2021-10-29: qty 2, 28d supply, fill #4
  Filled 2021-11-24: qty 2, 28d supply, fill #5
  Filled 2021-12-26: qty 2, 28d supply, fill #6
  Filled 2022-01-27: qty 2, 28d supply, fill #7
  Filled 2022-02-28: qty 2, 28d supply, fill #0
  Filled 2022-03-31: qty 2, 28d supply, fill #1
  Filled 2022-04-29: qty 2, 28d supply, fill #2

## 2021-05-02 ENCOUNTER — Other Ambulatory Visit (HOSPITAL_COMMUNITY): Payer: Self-pay

## 2021-05-02 ENCOUNTER — Encounter: Payer: Self-pay | Admitting: Family Medicine

## 2021-05-23 ENCOUNTER — Other Ambulatory Visit (HOSPITAL_COMMUNITY): Payer: Self-pay

## 2021-05-26 ENCOUNTER — Other Ambulatory Visit (HOSPITAL_COMMUNITY): Payer: Self-pay

## 2021-06-24 ENCOUNTER — Other Ambulatory Visit (HOSPITAL_COMMUNITY): Payer: Self-pay

## 2021-06-24 MED ORDER — OMNIPOD 5 DEXG7G6 PODS GEN 5 MISC
5 refills | Status: DC
  Filled 2021-06-24: qty 10, 30d supply, fill #0
  Filled 2021-07-29: qty 10, 30d supply, fill #1
  Filled 2021-08-28: qty 10, 30d supply, fill #2
  Filled 2021-09-25: qty 10, 30d supply, fill #3
  Filled 2021-10-24: qty 10, 30d supply, fill #4
  Filled 2021-11-24: qty 10, 30d supply, fill #5
  Filled 2021-12-26: qty 10, 30d supply, fill #6

## 2021-07-29 ENCOUNTER — Other Ambulatory Visit (HOSPITAL_COMMUNITY): Payer: Self-pay

## 2021-07-29 ENCOUNTER — Other Ambulatory Visit: Payer: Self-pay | Admitting: Family Medicine

## 2021-07-29 DIAGNOSIS — G47419 Narcolepsy without cataplexy: Secondary | ICD-10-CM

## 2021-07-29 MED ORDER — ARMODAFINIL 250 MG PO TABS
250.0000 mg | ORAL_TABLET | Freq: Every day | ORAL | 2 refills | Status: DC
  Filled 2021-07-29: qty 30, 30d supply, fill #0
  Filled 2021-07-29: qty 60, 60d supply, fill #0
  Filled 2021-10-30: qty 90, 90d supply, fill #1

## 2021-07-29 NOTE — Telephone Encounter (Signed)
[facility] is requesting to fill pt armodafinil . Please advise [facility]

## 2021-08-23 DIAGNOSIS — Z794 Long term (current) use of insulin: Secondary | ICD-10-CM | POA: Diagnosis not present

## 2021-08-23 DIAGNOSIS — E1165 Type 2 diabetes mellitus with hyperglycemia: Secondary | ICD-10-CM | POA: Diagnosis not present

## 2021-08-28 ENCOUNTER — Other Ambulatory Visit (HOSPITAL_COMMUNITY): Payer: Self-pay

## 2021-08-29 ENCOUNTER — Other Ambulatory Visit (HOSPITAL_COMMUNITY): Payer: Self-pay

## 2021-08-29 DIAGNOSIS — E1165 Type 2 diabetes mellitus with hyperglycemia: Secondary | ICD-10-CM | POA: Diagnosis not present

## 2021-08-29 DIAGNOSIS — I10 Essential (primary) hypertension: Secondary | ICD-10-CM | POA: Diagnosis not present

## 2021-08-29 DIAGNOSIS — E7800 Pure hypercholesterolemia, unspecified: Secondary | ICD-10-CM | POA: Diagnosis not present

## 2021-08-29 DIAGNOSIS — M19049 Primary osteoarthritis, unspecified hand: Secondary | ICD-10-CM | POA: Diagnosis not present

## 2021-08-29 DIAGNOSIS — E039 Hypothyroidism, unspecified: Secondary | ICD-10-CM | POA: Diagnosis not present

## 2021-08-29 DIAGNOSIS — Z9641 Presence of insulin pump (external) (internal): Secondary | ICD-10-CM | POA: Diagnosis not present

## 2021-09-24 ENCOUNTER — Encounter: Payer: Self-pay | Admitting: Internal Medicine

## 2021-09-26 ENCOUNTER — Other Ambulatory Visit (HOSPITAL_COMMUNITY): Payer: Self-pay

## 2021-10-20 ENCOUNTER — Other Ambulatory Visit (HOSPITAL_COMMUNITY): Payer: Self-pay

## 2021-10-20 MED ORDER — INSULIN LISPRO 100 UNIT/ML IJ SOLN
50.0000 [IU] | Freq: Every day | INTRAMUSCULAR | 5 refills | Status: AC
  Filled 2021-10-20: qty 40, 80d supply, fill #0
  Filled 2022-01-20 (×2): qty 40, 80d supply, fill #1

## 2021-10-22 ENCOUNTER — Other Ambulatory Visit (HOSPITAL_COMMUNITY): Payer: Self-pay

## 2021-10-24 ENCOUNTER — Other Ambulatory Visit (HOSPITAL_COMMUNITY): Payer: Self-pay

## 2021-10-29 ENCOUNTER — Other Ambulatory Visit (HOSPITAL_COMMUNITY): Payer: Self-pay

## 2021-10-30 ENCOUNTER — Other Ambulatory Visit (HOSPITAL_COMMUNITY): Payer: Self-pay

## 2021-11-10 ENCOUNTER — Encounter: Payer: Self-pay | Admitting: Internal Medicine

## 2021-11-24 ENCOUNTER — Other Ambulatory Visit (HOSPITAL_COMMUNITY): Payer: Self-pay

## 2021-12-04 DIAGNOSIS — Z794 Long term (current) use of insulin: Secondary | ICD-10-CM | POA: Diagnosis not present

## 2021-12-04 DIAGNOSIS — E1165 Type 2 diabetes mellitus with hyperglycemia: Secondary | ICD-10-CM | POA: Diagnosis not present

## 2021-12-19 ENCOUNTER — Encounter: Payer: 59 | Admitting: Family Medicine

## 2021-12-26 ENCOUNTER — Other Ambulatory Visit (HOSPITAL_COMMUNITY): Payer: Self-pay

## 2021-12-26 ENCOUNTER — Other Ambulatory Visit: Payer: Self-pay | Admitting: Family Medicine

## 2021-12-26 DIAGNOSIS — E119 Type 2 diabetes mellitus without complications: Secondary | ICD-10-CM

## 2021-12-26 MED ORDER — LOSARTAN POTASSIUM 25 MG PO TABS
25.0000 mg | ORAL_TABLET | Freq: Every day | ORAL | 1 refills | Status: DC
  Filled 2021-12-26: qty 90, 90d supply, fill #0

## 2021-12-26 MED ORDER — LEVOTHYROXINE SODIUM 175 MCG PO TABS
175.0000 ug | ORAL_TABLET | Freq: Every morning | ORAL | 4 refills | Status: AC
  Filled 2021-12-26: qty 90, 90d supply, fill #0
  Filled 2022-03-31: qty 90, 90d supply, fill #1
  Filled 2022-07-07: qty 90, 90d supply, fill #2
  Filled 2022-09-28: qty 90, 90d supply, fill #3

## 2021-12-29 ENCOUNTER — Other Ambulatory Visit (HOSPITAL_COMMUNITY): Payer: Self-pay

## 2021-12-29 MED ORDER — OMNIPOD 5 DEXG7G6 PODS GEN 5 MISC
4 refills | Status: AC
  Filled 2021-12-29: qty 30, 90d supply, fill #0
  Filled 2022-01-27 – 2022-02-28 (×2): qty 10, 30d supply, fill #0
  Filled 2022-03-31: qty 10, 30d supply, fill #1
  Filled 2022-04-29: qty 10, 30d supply, fill #2

## 2022-01-02 ENCOUNTER — Ambulatory Visit (INDEPENDENT_AMBULATORY_CARE_PROVIDER_SITE_OTHER): Payer: 59 | Admitting: Family Medicine

## 2022-01-02 ENCOUNTER — Other Ambulatory Visit (HOSPITAL_COMMUNITY): Payer: Self-pay

## 2022-01-02 ENCOUNTER — Encounter: Payer: Self-pay | Admitting: Family Medicine

## 2022-01-02 VITALS — BP 108/70 | HR 72 | Ht 61.25 in | Wt 165.6 lb

## 2022-01-02 DIAGNOSIS — E039 Hypothyroidism, unspecified: Secondary | ICD-10-CM | POA: Diagnosis not present

## 2022-01-02 DIAGNOSIS — L501 Idiopathic urticaria: Secondary | ICD-10-CM | POA: Diagnosis not present

## 2022-01-02 DIAGNOSIS — E785 Hyperlipidemia, unspecified: Secondary | ICD-10-CM

## 2022-01-02 DIAGNOSIS — Z23 Encounter for immunization: Secondary | ICD-10-CM | POA: Diagnosis not present

## 2022-01-02 DIAGNOSIS — G47419 Narcolepsy without cataplexy: Secondary | ICD-10-CM | POA: Diagnosis not present

## 2022-01-02 DIAGNOSIS — E669 Obesity, unspecified: Secondary | ICD-10-CM | POA: Diagnosis not present

## 2022-01-02 DIAGNOSIS — E66811 Obesity, class 1: Secondary | ICD-10-CM

## 2022-01-02 DIAGNOSIS — Z0000 Encounter for general adult medical examination without abnormal findings: Secondary | ICD-10-CM

## 2022-01-02 DIAGNOSIS — E119 Type 2 diabetes mellitus without complications: Secondary | ICD-10-CM

## 2022-01-02 DIAGNOSIS — Z1231 Encounter for screening mammogram for malignant neoplasm of breast: Secondary | ICD-10-CM

## 2022-01-02 DIAGNOSIS — J309 Allergic rhinitis, unspecified: Secondary | ICD-10-CM | POA: Diagnosis not present

## 2022-01-02 DIAGNOSIS — H4020X4 Unspecified primary angle-closure glaucoma, indeterminate stage: Secondary | ICD-10-CM

## 2022-01-02 LAB — POCT URINALYSIS DIP (PROADVANTAGE DEVICE)
Bilirubin, UA: NEGATIVE
Blood, UA: NEGATIVE
Glucose, UA: NEGATIVE mg/dL
Ketones, POC UA: NEGATIVE mg/dL
Leukocytes, UA: NEGATIVE
Nitrite, UA: NEGATIVE
Protein Ur, POC: NEGATIVE mg/dL
Specific Gravity, Urine: 1.025
Urobilinogen, Ur: 0.2
pH, UA: 6 (ref 5.0–8.0)

## 2022-01-02 MED ORDER — ARMODAFINIL 250 MG PO TABS
250.0000 mg | ORAL_TABLET | Freq: Every day | ORAL | 3 refills | Status: DC
  Filled 2022-01-02 – 2022-01-30 (×2): qty 90, 90d supply, fill #0
  Filled 2022-04-29: qty 90, 90d supply, fill #1

## 2022-01-02 MED ORDER — ATORVASTATIN CALCIUM 20 MG PO TABS
20.0000 mg | ORAL_TABLET | Freq: Every day | ORAL | 3 refills | Status: DC
  Filled 2022-01-02 – 2022-01-30 (×2): qty 90, 90d supply, fill #0
  Filled 2022-04-29: qty 90, 90d supply, fill #1
  Filled 2022-07-29 – 2022-08-07 (×2): qty 90, 90d supply, fill #2
  Filled 2022-11-09: qty 90, 90d supply, fill #3

## 2022-01-02 MED ORDER — LOSARTAN POTASSIUM 25 MG PO TABS
25.0000 mg | ORAL_TABLET | Freq: Every day | ORAL | 3 refills | Status: DC
  Filled 2022-01-02 – 2022-03-31 (×2): qty 90, 90d supply, fill #0
  Filled 2022-07-07: qty 90, 90d supply, fill #1
  Filled 2022-09-28: qty 90, 90d supply, fill #2

## 2022-01-02 NOTE — Progress Notes (Signed)
Complete physical exam  Patient: Alice Jones   DOB: Apr 02, 1960   61 y.o. Female  MRN: [id number]  Subjective:    Chief Complaint  Patient presents with   Annual Exam    Fasting. No additional concerns.     Alice Jones is a 61 y.o. female who presents today for a complete physical exam. She reports consuming a general diet. The patient does not participate in regular exercise at present. She generally feels fairly well. She reports sleeping fairly well. She is followed regularly by Dr. Debbora Presto for her diabetes as well as her thyroid.  She is now on [facility] which has helped with her weight and her diabetes.  She continues on Provigil for her narcolepsy and is doing well on that.  She does use Benadryl at night to help with her urticaria and usually 1 pill takes care of her problems.  She continues on Cozaar and having no difficulty with that.  Her allergies are under good control.  She follows up regularly with ophthalmology. Her work and home life are going well.  She has been having some TMJ difficulty.  Otherwise family and social history as well as health maintenance and immunizations was reviewed.  Most recent fall risk assessment:    11/29/2020   10:50 AM  [place] in the past year? 0  Number falls in past yr: 0  Injury with Fall? 0  Risk for fall due to : No Fall Risks     Most recent depression screenings:    01/02/2022   11:20 AM 11/29/2020   10:51 AM  PHQ 2/9 Scores  PHQ - 2 Score 0 0    Vision:Not within last year  and Dental: Receives regular dental care    Patient Care Team: Denita Lung, MD as PCP - General (Family Medicine) Kennith Center, RD as Dietitian [facility])   Outpatient Medications Prior to Visit  Medication Sig   cholecalciferol (VITAMIN D) 1000 UNITS tablet Take 1,000 Units by mouth daily.   Coenzyme Q10 (COQ10) 100 MG CAPS Take 1 capsule by mouth daily.   diphenhydrAMINE (BENADRYL) 25 MG tablet Take 12.5-25 mg  by mouth at bedtime.    Esomeprazole Magnesium (NEXIUM PO) Take 20 mg by mouth daily.    Insulin Disposable Pump (OMNIPOD 5 G6 INTRO, GEN 5,) KIT Use as directed daily   Insulin Disposable Pump (OMNIPOD 5 G6 POD, GEN 5,) MISC Change every 3 days   insulin lispro (HUMALOG) 100 UNIT/ML injection Inject 0.5 mLs (50 Units total) per day via pump   levothyroxine (SYNTHROID) 175 MCG tablet Take 1 tablet (175 mcg total) by mouth in the morning, on an empty stomach   Multiple Vitamins-Minerals (MULTIVITAMINS) CHEW Chew 1 tablet by mouth daily.   NON FORMULARY Take 1 capsule by mouth daily. Tumeric   NON FORMULARY Take 2 capsules by mouth daily. Elysium supplement   tirzepatide (MOUNJARO) 10 MG/0.5ML Pen Inject 10 mg into the skin once a week.   [DISCONTINUED] Armodafinil 250 MG tablet Take 1 tablet (250 mg total) by mouth daily.   [DISCONTINUED] atorvastatin (LIPITOR) 20 MG tablet Take 1 tablet (20 mg total) by mouth daily.   [DISCONTINUED] losartan (COZAAR) 25 MG tablet Take 1 tablet (25 mg total) by mouth daily.   ACCU-CHEK SMARTVIEW test strip    DYMISTA 137-50 MCG/ACT SUSP Place 1 spray into both nostrils 2 (two) times daily. (Patient not taking: Reported on 09/02/2020)   fish oil-omega-3  fatty acids 1000 MG capsule Take 2 g by mouth daily. (Patient not taking: Reported on 01/02/2022)   meloxicam (MOBIC) 7.5 MG tablet Take 7.5 mg by mouth daily. (Patient not taking: Reported on 09/02/2020)   tirzepatide [facility]) 5 MG/0.5ML Pen Inject 5 mg into the skin once a week.   tirzepatide (MOUNJARO) 7.5 MG/0.5ML Pen Inject 7.5 mg into the skin once a week. (Patient not taking: Reported on 01/02/2022)   tirzepatide (MOUNJARO) 7.5 MG/0.5ML Pen Inject 7.5 mg into the skin once a week. (Patient not taking: Reported on 01/02/2022)   tirzepatide (MOUNJARO) 7.5 MG/0.5ML Pen Inject 7.5 mg into the skin once a week. (Patient not taking: Reported on 01/02/2022)   [DISCONTINUED] albuterol (PROVENTIL HFA) 108 (90 Base)  MCG/ACT inhaler Inhale 1-2 puffs into the lungs every 6 (six) hours as needed for up to 5 days for wheezing or shortness of breath (SOB). (Patient not taking: No sig reported)   [DISCONTINUED] levothyroxine (SYNTHROID) 175 MCG tablet TAKE 1 TABLET BY MOUTH ONCE DAILY IN THE MORNING ON AN EMPTY STOMACH.   [DISCONTINUED] tirzepatide [facility]) 5 MG/0.5ML Pen Inject 5 mg into the skin once a week.   [DISCONTINUED] triamcinolone (KENALOG) 0.1 % paste Use as directed 1 application in the mouth or throat 2 (two) times daily. (Patient not taking: No sig reported)   No facility-administered medications prior to visit.    Review of Systems  All other systems reviewed and are negative.         Objective:     BP 108/70   Pulse 72   Ht 5' 1.25" (1.556 m)   Wt 165 lb 9.6 oz (75.1 kg)   SpO2 96% Comment: room air  BMI 31.03 kg/m    Physical Exam   Alert and in no distress. Tympanic membranes and canals are normal. Pharyngeal area is normal. Neck is supple without adenopathy or thyromegaly. Cardiac exam shows a regular sinus rhythm without murmurs or gallops. Lungs are clear to auscultation.     Assessment & Plan:    Routine general medical examination at a health care facility - Plan: CBC with Differential/Platelet, Comprehensive metabolic panel, Lipid panel  Allergic rhinitis, unspecified seasonality, unspecified trigger  Hypothyroidism, unspecified type  Type 2 diabetes mellitus without complication, without long-term current use of insulin (HCC) - Plan: losartan (COZAAR) 25 MG tablet, CBC with Differential/Platelet, Comprehensive metabolic panel, Lipid panel  Chronic idiopathic urticaria  Obesity (BMI 30.0-34.9)  Narcolepsy without cataplexy - Plan: Armodafinil 250 MG tablet  Hyperlipidemia, unspecified hyperlipidemia type - Plan: atorvastatin (LIPITOR) 20 MG tablet, Lipid panel  Angle-closure glaucoma, indeterminate stage  Need for influenza vaccination - Plan: Flu Vaccine  QUAD 6+ mos PF IM (Fluarix Quad PF)  Need for COVID-19 vaccine - Plan: Pfizer Fall 2023 Covid-19 Vaccine 25yr and older  Encounter for screening mammogram for malignant neoplasm of breast - Plan: MM Digital Screening  Immunization History  Administered Date(s) Administered   COVID-19, mRNA, vaccine(Comirnaty)12 years and older 01/02/2022   Influenza,inj,Quad PF,6+ Mos 10/21/2018, 11/17/2019, 11/29/2020, 01/02/2022   PFIZER(Purple Top)SARS-COV-2 Vaccination 03/31/2019, 04/26/2019, 11/03/2019   Pfizer Covid-19 Vaccine Bivalent Booster 120yr& up 11/29/2020   Pneumococcal Conjugate-13 11/12/2016   Pneumococcal Polysaccharide-23 08/12/2018   Tdap 07/20/2010    Health Maintenance  Topic Date Due   FOOT EXAM  Never done   HIV Screening  Never done   Zoster Vaccines- Shingrix (1 of 2) Never done   COLON CANCER SCREENING ANNUAL FOBT  04/04/2016   MAMMOGRAM  10/09/2018  DTaP/Tdap/Td (2 - Td or Tdap) 07/19/2020   OPHTHALMOLOGY EXAM  02/20/2021   HEMOGLOBIN A1C  05/09/2021   PAP SMEAR-Modifier  10/20/2021   COLONOSCOPY (Pts 45-22yr Insurance coverage will need to be confirmed)  10/28/2021   Diabetic kidney evaluation - eGFR measurement  11/29/2021   Diabetic kidney evaluation - Urine ACR  11/29/2021   INFLUENZA VACCINE  Completed   COVID-19 Vaccine  Completed   Hepatitis C Screening  Completed   HPV VACCINES  Aged Out    She will continue on her present medication regimen.  Recommend she discuss the TMJ problem with her dentist. Problem List Items Addressed This Visit     Allergic rhinitis   Chronic idiopathic urticaria   Diabetes (HCC)   Relevant Medications   atorvastatin (LIPITOR) 20 MG tablet   losartan (COZAAR) 25 MG tablet   Other Relevant Orders   CBC with Differential/Platelet   Comprehensive metabolic panel   Lipid panel   Glaucoma, narrow-angle   Hyperlipemia   Relevant Medications   atorvastatin (LIPITOR) 20 MG tablet   losartan (COZAAR) 25 MG tablet   Other  Relevant Orders   Lipid panel   Hypothyroid   Narcolepsy without cataplexy   Relevant Medications   Armodafinil 250 MG tablet   Obesity (BMI 30.0-34.9)   Other Visit Diagnoses     Routine general medical examination at a health care facility    -  Primary   Relevant Orders   CBC with Differential/Platelet   Comprehensive metabolic panel   Lipid panel   Need for influenza vaccination       Relevant Orders   Flu Vaccine QUAD 6+ mos PF IM (Fluarix Quad PF) (Completed)   Need for COVID-19 vaccine       Relevant Orders   Pfizer Fall 2023 Covid-19 Vaccine 114yrand older (Completed)   Encounter for screening mammogram for malignant neoplasm of breast       Relevant Orders   MM Digital Screening     Continue on present medication regimen. Follow-up 1 year but also set up to come in for a Tdap sometime within the next 6 months.    JoJill AlexandersMD

## 2022-01-02 NOTE — Addendum Note (Signed)
Addended by: Randal Buba on: 01/02/2022 02:47 PM   Modules accepted: Orders

## 2022-01-03 LAB — COMPREHENSIVE METABOLIC PANEL
ALT: 27 IU/L (ref 0–32)
AST: 20 IU/L (ref 0–40)
Albumin/Globulin Ratio: 1.6 (ref 1.2–2.2)
Albumin: 4 g/dL (ref 3.8–4.9)
Alkaline Phosphatase: 147 IU/L — ABNORMAL HIGH (ref 44–121)
BUN/Creatinine Ratio: 16 (ref 12–28)
BUN: 14 mg/dL (ref 8–27)
Bilirubin Total: 0.2 mg/dL (ref 0.0–1.2)
CO2: 24 mmol/L (ref 20–29)
Calcium: 10.1 mg/dL (ref 8.7–10.3)
Chloride: 103 mmol/L (ref 96–106)
Creatinine, Ser: 0.87 mg/dL (ref 0.57–1.00)
Globulin, Total: 2.5 g/dL (ref 1.5–4.5)
Glucose: 140 mg/dL — ABNORMAL HIGH (ref 70–99)
Potassium: 4.5 mmol/L (ref 3.5–5.2)
Sodium: 140 mmol/L (ref 134–144)
Total Protein: 6.5 g/dL (ref 6.0–8.5)
eGFR: 76 mL/min/{1.73_m2} (ref >59)

## 2022-01-03 LAB — LIPID PANEL
Chol/HDL Ratio: 1.8 ratio (ref 0.0–4.4)
Cholesterol, Total: 162 mg/dL (ref 100–199)
HDL: 90 mg/dL (ref >39)
LDL Chol Calc (NIH): 56 mg/dL (ref 0–99)
Triglycerides: 85 mg/dL (ref 0–149)
VLDL Cholesterol Cal: 16 mg/dL (ref 5–40)

## 2022-01-03 LAB — CBC WITH DIFFERENTIAL/PLATELET
Basophils Absolute: 0.1 10*3/uL (ref 0.0–0.2)
Basos: 1 %
EOS (ABSOLUTE): 0.2 10*3/uL (ref 0.0–0.4)
Eos: 2 %
Hematocrit: 43.5 % (ref 34.0–46.6)
Hemoglobin: 14.4 g/dL (ref 11.1–15.9)
Immature Grans (Abs): 0 10*3/uL (ref 0.0–0.1)
Immature Granulocytes: 0 %
Lymphocytes Absolute: 3.3 10*3/uL — ABNORMAL HIGH (ref 0.7–3.1)
Lymphs: 33 %
MCH: 29.9 pg (ref 26.6–33.0)
MCHC: 33.1 g/dL (ref 31.5–35.7)
MCV: 90 fL (ref 79–97)
Monocytes Absolute: 0.7 10*3/uL (ref 0.1–0.9)
Monocytes: 7 %
Neutrophils Absolute: 5.7 10*3/uL (ref 1.4–7.0)
Neutrophils: 57 %
Platelets: 363 10*3/uL (ref 150–450)
RBC: 4.81 x10E6/uL (ref 3.77–5.28)
RDW: 12.4 % (ref 11.7–15.4)
WBC: 9.8 10*3/uL (ref 3.4–10.8)

## 2022-01-20 ENCOUNTER — Other Ambulatory Visit: Payer: Self-pay

## 2022-01-20 ENCOUNTER — Other Ambulatory Visit: Payer: Self-pay | Admitting: Family Medicine

## 2022-01-20 ENCOUNTER — Other Ambulatory Visit (HOSPITAL_COMMUNITY): Payer: Self-pay

## 2022-01-20 MED ORDER — ACCU-CHEK SMARTVIEW VI STRP
ORAL_STRIP | 11 refills | Status: AC
  Filled 2022-01-20: qty 50, 50d supply, fill #0

## 2022-01-21 ENCOUNTER — Other Ambulatory Visit (HOSPITAL_COMMUNITY): Payer: Self-pay

## 2022-01-21 MED ORDER — FREESTYLE LITE TEST VI STRP
ORAL_STRIP | Freq: Four times a day (QID) | 4 refills | Status: AC
  Filled 2022-01-21: qty 300, 75d supply, fill #0

## 2022-01-21 MED ORDER — FREESTYLE LITE W/DEVICE KIT
PACK | 0 refills | Status: AC
  Filled 2022-01-21: qty 1, 1d supply, fill #0

## 2022-01-22 ENCOUNTER — Other Ambulatory Visit (HOSPITAL_COMMUNITY): Payer: Self-pay

## 2022-01-22 MED ORDER — ACCU-CHEK FASTCLIX LANCETS MISC
11 refills | Status: AC
  Filled 2022-01-22: qty 200, 30d supply, fill #0

## 2022-01-23 ENCOUNTER — Other Ambulatory Visit (HOSPITAL_COMMUNITY): Payer: Self-pay

## 2022-01-26 ENCOUNTER — Other Ambulatory Visit (HOSPITAL_COMMUNITY): Payer: Self-pay

## 2022-01-26 MED ORDER — INSULIN LISPRO 100 UNIT/ML IJ SOLN
150.0000 [IU] | Freq: Every day | INTRAMUSCULAR | 6 refills | Status: AC
  Filled 2022-01-26: qty 40, 28d supply, fill #0

## 2022-01-26 MED ORDER — INSULIN LISPRO 100 UNIT/ML IJ SOLN
50.0000 [IU] | Freq: Every day | INTRAMUSCULAR | 5 refills | Status: DC
  Filled 2022-01-26: qty 40, 80d supply, fill #0
  Filled 2022-06-26: qty 40, 80d supply, fill #1
  Filled 2022-10-28: qty 40, 80d supply, fill #2

## 2022-01-27 ENCOUNTER — Other Ambulatory Visit (HOSPITAL_COMMUNITY): Payer: Self-pay

## 2022-01-30 ENCOUNTER — Other Ambulatory Visit: Payer: Self-pay

## 2022-01-30 ENCOUNTER — Other Ambulatory Visit (HOSPITAL_COMMUNITY): Payer: Self-pay

## 2022-02-28 ENCOUNTER — Other Ambulatory Visit (HOSPITAL_COMMUNITY): Payer: Self-pay

## 2022-03-13 ENCOUNTER — Other Ambulatory Visit (HOSPITAL_COMMUNITY): Payer: Self-pay

## 2022-03-13 ENCOUNTER — Ambulatory Visit
Admission: RE | Admit: 2022-03-13 | Discharge: 2022-03-13 | Disposition: A | Discharge status: Home / Self Care | Payer: Commercial Managed Care - PPO | Source: Ambulatory Visit | Attending: Family Medicine | Admitting: Family Medicine

## 2022-03-13 DIAGNOSIS — Z1231 Encounter for screening mammogram for malignant neoplasm of breast: Secondary | ICD-10-CM | POA: Diagnosis not present

## 2022-03-13 MED ORDER — DEXCOM G6 TRANSMITTER MISC
1 refills | Status: AC
  Filled 2022-03-13 – 2022-05-11 (×3): qty 1, 90d supply, fill #0
  Filled 2022-09-07 – 2022-09-29 (×2): qty 1, 90d supply, fill #1

## 2022-03-13 MED ORDER — DEXCOM G6 SENSOR MISC
3 refills | Status: AC
  Filled 2022-03-13 – 2022-05-11 (×3): qty 9, 90d supply, fill #0
  Filled 2022-09-05: qty 9, 90d supply, fill #1
  Filled 2022-12-01: qty 9, 90d supply, fill #2

## 2022-03-16 ENCOUNTER — Other Ambulatory Visit (HOSPITAL_COMMUNITY): Payer: Self-pay

## 2022-03-31 ENCOUNTER — Other Ambulatory Visit (HOSPITAL_COMMUNITY): Payer: Self-pay

## 2022-03-31 ENCOUNTER — Other Ambulatory Visit: Payer: Self-pay

## 2022-04-24 ENCOUNTER — Other Ambulatory Visit (HOSPITAL_COMMUNITY): Payer: Self-pay

## 2022-04-24 DIAGNOSIS — E039 Hypothyroidism, unspecified: Secondary | ICD-10-CM | POA: Diagnosis not present

## 2022-04-24 DIAGNOSIS — Z9641 Presence of insulin pump (external) (internal): Secondary | ICD-10-CM | POA: Diagnosis not present

## 2022-04-24 DIAGNOSIS — E7800 Pure hypercholesterolemia, unspecified: Secondary | ICD-10-CM | POA: Diagnosis not present

## 2022-04-24 DIAGNOSIS — E1165 Type 2 diabetes mellitus with hyperglycemia: Secondary | ICD-10-CM | POA: Diagnosis not present

## 2022-04-24 MED ORDER — MOUNJARO 10 MG/0.5ML ~~LOC~~ SOAJ
10.0000 mg | SUBCUTANEOUS | 5 refills | Status: DC
  Filled 2022-04-24: qty 6, 84d supply, fill #0
  Filled 2022-07-24: qty 6, 84d supply, fill #1
  Filled 2022-10-28: qty 6, 84d supply, fill #2
  Filled 2023-02-05: qty 6, 84d supply, fill #3

## 2022-04-24 MED ORDER — OMNIPOD 5 DEXG7G6 PODS GEN 5 MISC
5 refills | Status: AC
  Filled 2022-04-24: qty 30, 90d supply, fill #0
  Filled 2022-07-29 – 2022-08-01 (×2): qty 30, 90d supply, fill #1
  Filled 2022-08-01: qty 30, 90d supply, fill #0
  Filled 2022-08-01 – 2022-10-28 (×2): qty 30, 90d supply, fill #1
  Filled 2023-01-29: qty 30, 90d supply, fill #2

## 2022-04-29 ENCOUNTER — Other Ambulatory Visit (HOSPITAL_COMMUNITY): Payer: Self-pay

## 2022-04-29 ENCOUNTER — Other Ambulatory Visit: Payer: Self-pay

## 2022-04-29 DIAGNOSIS — H2513 Age-related nuclear cataract, bilateral: Secondary | ICD-10-CM | POA: Diagnosis not present

## 2022-04-29 DIAGNOSIS — H40033 Anatomical narrow angle, bilateral: Secondary | ICD-10-CM | POA: Diagnosis not present

## 2022-04-29 DIAGNOSIS — H11823 Conjunctivochalasis, bilateral: Secondary | ICD-10-CM | POA: Diagnosis not present

## 2022-04-29 DIAGNOSIS — E119 Type 2 diabetes mellitus without complications: Secondary | ICD-10-CM | POA: Diagnosis not present

## 2022-04-29 LAB — HM DIABETES EYE EXAM

## 2022-05-11 ENCOUNTER — Other Ambulatory Visit (HOSPITAL_COMMUNITY): Payer: Self-pay

## 2022-05-19 ENCOUNTER — Other Ambulatory Visit: Payer: Self-pay

## 2022-06-26 ENCOUNTER — Other Ambulatory Visit (HOSPITAL_COMMUNITY): Payer: Self-pay

## 2022-07-17 ENCOUNTER — Other Ambulatory Visit: Payer: Self-pay

## 2022-07-29 ENCOUNTER — Other Ambulatory Visit: Payer: Self-pay

## 2022-07-29 ENCOUNTER — Other Ambulatory Visit (HOSPITAL_COMMUNITY): Payer: Self-pay

## 2022-07-29 ENCOUNTER — Other Ambulatory Visit: Payer: Self-pay | Admitting: Family Medicine

## 2022-07-29 DIAGNOSIS — G47419 Narcolepsy without cataplexy: Secondary | ICD-10-CM

## 2022-07-29 MED ORDER — ARMODAFINIL 250 MG PO TABS
250.0000 mg | ORAL_TABLET | Freq: Every day | ORAL | 1 refills | Status: DC
Start: 2022-07-29 — End: 2023-01-11
  Filled 2022-07-29 – 2022-08-07 (×2): qty 90, 90d supply, fill #0
  Filled 2022-11-09: qty 90, 90d supply, fill #1

## 2022-08-01 ENCOUNTER — Other Ambulatory Visit (HOSPITAL_COMMUNITY): Payer: Self-pay

## 2022-08-03 ENCOUNTER — Other Ambulatory Visit (HOSPITAL_COMMUNITY): Payer: Self-pay

## 2022-08-06 ENCOUNTER — Other Ambulatory Visit (HOSPITAL_COMMUNITY): Payer: Self-pay

## 2022-08-07 ENCOUNTER — Other Ambulatory Visit (HOSPITAL_COMMUNITY): Payer: Self-pay

## 2022-09-07 ENCOUNTER — Other Ambulatory Visit (HOSPITAL_COMMUNITY): Payer: Self-pay

## 2022-09-22 ENCOUNTER — Other Ambulatory Visit (HOSPITAL_COMMUNITY): Payer: Self-pay

## 2022-09-29 ENCOUNTER — Other Ambulatory Visit (HOSPITAL_COMMUNITY): Payer: Self-pay

## 2022-10-28 ENCOUNTER — Other Ambulatory Visit (HOSPITAL_COMMUNITY): Payer: Self-pay

## 2022-10-28 ENCOUNTER — Other Ambulatory Visit: Payer: Self-pay

## 2022-11-10 ENCOUNTER — Other Ambulatory Visit: Payer: Self-pay

## 2022-11-10 ENCOUNTER — Other Ambulatory Visit (HOSPITAL_COMMUNITY): Payer: Self-pay

## 2022-12-01 ENCOUNTER — Other Ambulatory Visit (HOSPITAL_COMMUNITY): Payer: Self-pay

## 2022-12-01 ENCOUNTER — Other Ambulatory Visit: Payer: Self-pay

## 2022-12-01 MED ORDER — DEXCOM G6 TRANSMITTER MISC
1 refills | Status: AC
  Filled 2022-12-01: qty 1, 90d supply, fill #0

## 2022-12-09 ENCOUNTER — Other Ambulatory Visit (HOSPITAL_COMMUNITY): Payer: Self-pay

## 2022-12-10 ENCOUNTER — Other Ambulatory Visit (HOSPITAL_COMMUNITY): Payer: Self-pay

## 2022-12-10 MED ORDER — DEXCOM G6 TRANSMITTER MISC
1.0000 | 1 refills | Status: AC
  Filled 2022-12-10 – 2022-12-22 (×2): qty 1, 90d supply, fill #0

## 2022-12-15 DIAGNOSIS — E1165 Type 2 diabetes mellitus with hyperglycemia: Secondary | ICD-10-CM | POA: Diagnosis not present

## 2022-12-15 DIAGNOSIS — E039 Hypothyroidism, unspecified: Secondary | ICD-10-CM | POA: Diagnosis not present

## 2022-12-15 DIAGNOSIS — E7800 Pure hypercholesterolemia, unspecified: Secondary | ICD-10-CM | POA: Diagnosis not present

## 2022-12-21 ENCOUNTER — Other Ambulatory Visit (HOSPITAL_COMMUNITY): Payer: Self-pay

## 2022-12-22 ENCOUNTER — Other Ambulatory Visit (HOSPITAL_COMMUNITY): Payer: Self-pay

## 2022-12-23 ENCOUNTER — Other Ambulatory Visit (HOSPITAL_COMMUNITY): Payer: Self-pay

## 2022-12-23 ENCOUNTER — Other Ambulatory Visit: Payer: Self-pay | Admitting: Endocrinology

## 2022-12-23 DIAGNOSIS — E1165 Type 2 diabetes mellitus with hyperglycemia: Secondary | ICD-10-CM | POA: Diagnosis not present

## 2022-12-23 DIAGNOSIS — E039 Hypothyroidism, unspecified: Secondary | ICD-10-CM | POA: Diagnosis not present

## 2022-12-23 DIAGNOSIS — Z9641 Presence of insulin pump (external) (internal): Secondary | ICD-10-CM | POA: Diagnosis not present

## 2022-12-23 DIAGNOSIS — E010 Iodine-deficiency related diffuse (endemic) goiter: Secondary | ICD-10-CM

## 2022-12-23 DIAGNOSIS — E7800 Pure hypercholesterolemia, unspecified: Secondary | ICD-10-CM | POA: Diagnosis not present

## 2022-12-23 MED ORDER — LEVOTHYROXINE SODIUM 150 MCG PO TABS
150.0000 ug | ORAL_TABLET | Freq: Every morning | ORAL | 5 refills | Status: DC
  Filled 2022-12-23 – 2023-01-07 (×2): qty 30, 30d supply, fill #0
  Filled 2023-02-05: qty 30, 30d supply, fill #1
  Filled 2023-03-03: qty 30, 30d supply, fill #2
  Filled 2023-04-14: qty 30, 30d supply, fill #3
  Filled 2023-05-18: qty 30, 30d supply, fill #4
  Filled 2023-06-17: qty 30, 30d supply, fill #5

## 2022-12-25 ENCOUNTER — Ambulatory Visit
Admission: RE | Admit: 2022-12-25 | Discharge: 2022-12-25 | Disposition: A | Discharge status: Home / Self Care | Payer: Commercial Managed Care - PPO | Source: Ambulatory Visit | Attending: Endocrinology | Admitting: Endocrinology

## 2022-12-25 DIAGNOSIS — E010 Iodine-deficiency related diffuse (endemic) goiter: Secondary | ICD-10-CM

## 2022-12-25 DIAGNOSIS — E042 Nontoxic multinodular goiter: Secondary | ICD-10-CM | POA: Diagnosis not present

## 2023-01-04 ENCOUNTER — Other Ambulatory Visit (HOSPITAL_COMMUNITY): Payer: Self-pay

## 2023-01-07 ENCOUNTER — Other Ambulatory Visit (HOSPITAL_COMMUNITY): Payer: Self-pay

## 2023-01-07 ENCOUNTER — Other Ambulatory Visit: Payer: Self-pay | Admitting: Family Medicine

## 2023-01-07 DIAGNOSIS — E119 Type 2 diabetes mellitus without complications: Secondary | ICD-10-CM

## 2023-01-07 MED ORDER — LOSARTAN POTASSIUM 25 MG PO TABS
25.0000 mg | ORAL_TABLET | Freq: Every day | ORAL | 0 refills | Status: DC
  Filled 2023-01-07: qty 90, 90d supply, fill #0

## 2023-01-11 ENCOUNTER — Encounter: Payer: Self-pay | Admitting: Family Medicine

## 2023-01-11 ENCOUNTER — Ambulatory Visit (INDEPENDENT_AMBULATORY_CARE_PROVIDER_SITE_OTHER): Payer: Commercial Managed Care - PPO | Admitting: Family Medicine

## 2023-01-11 ENCOUNTER — Other Ambulatory Visit (HOSPITAL_COMMUNITY): Payer: Self-pay

## 2023-01-11 VITALS — BP 112/68 | HR 83 | Ht 61.5 in | Wt 163.4 lb

## 2023-01-11 DIAGNOSIS — E042 Nontoxic multinodular goiter: Secondary | ICD-10-CM

## 2023-01-11 DIAGNOSIS — G47419 Narcolepsy without cataplexy: Secondary | ICD-10-CM

## 2023-01-11 DIAGNOSIS — H4020X1 Unspecified primary angle-closure glaucoma, mild stage: Secondary | ICD-10-CM

## 2023-01-11 DIAGNOSIS — Z9889 Other specified postprocedural states: Secondary | ICD-10-CM

## 2023-01-11 DIAGNOSIS — Z0000 Encounter for general adult medical examination without abnormal findings: Secondary | ICD-10-CM

## 2023-01-11 DIAGNOSIS — E782 Mixed hyperlipidemia: Secondary | ICD-10-CM | POA: Diagnosis not present

## 2023-01-11 DIAGNOSIS — E038 Other specified hypothyroidism: Secondary | ICD-10-CM | POA: Diagnosis not present

## 2023-01-11 DIAGNOSIS — E66811 Obesity, class 1: Secondary | ICD-10-CM

## 2023-01-11 DIAGNOSIS — J301 Allergic rhinitis due to pollen: Secondary | ICD-10-CM

## 2023-01-11 DIAGNOSIS — Z23 Encounter for immunization: Secondary | ICD-10-CM

## 2023-01-11 DIAGNOSIS — E119 Type 2 diabetes mellitus without complications: Secondary | ICD-10-CM

## 2023-01-11 DIAGNOSIS — L501 Idiopathic urticaria: Secondary | ICD-10-CM | POA: Diagnosis not present

## 2023-01-11 DIAGNOSIS — E1169 Type 2 diabetes mellitus with other specified complication: Secondary | ICD-10-CM

## 2023-01-11 DIAGNOSIS — Z794 Long term (current) use of insulin: Secondary | ICD-10-CM

## 2023-01-11 DIAGNOSIS — G4734 Idiopathic sleep related nonobstructive alveolar hypoventilation: Secondary | ICD-10-CM

## 2023-01-11 DIAGNOSIS — E785 Hyperlipidemia, unspecified: Secondary | ICD-10-CM

## 2023-01-11 MED ORDER — ATORVASTATIN CALCIUM 20 MG PO TABS
20.0000 mg | ORAL_TABLET | Freq: Every day | ORAL | 3 refills | Status: AC
  Filled 2023-01-11 – 2023-02-05 (×2): qty 90, 90d supply, fill #0
  Filled 2023-06-08: qty 90, 90d supply, fill #1
  Filled 2023-09-15 – 2023-09-18 (×4): qty 90, 90d supply, fill #2
  Filled 2023-12-22 (×2): qty 90, 90d supply, fill #0
  Filled 2023-12-22: qty 90, 90d supply, fill #3

## 2023-01-11 MED ORDER — ARMODAFINIL 250 MG PO TABS
250.0000 mg | ORAL_TABLET | Freq: Every day | ORAL | 1 refills | Status: DC
  Filled 2023-01-11 – 2023-02-05 (×2): qty 90, 90d supply, fill #0
  Filled 2023-05-18: qty 1, 1d supply, fill #1
  Filled 2023-05-18: qty 89, 89d supply, fill #1

## 2023-01-11 MED ORDER — LOSARTAN POTASSIUM 25 MG PO TABS
25.0000 mg | ORAL_TABLET | Freq: Every day | ORAL | 0 refills | Status: DC
  Filled 2023-01-11 – 2023-04-15 (×2): qty 90, 90d supply, fill #0

## 2023-01-11 NOTE — Progress Notes (Signed)
Complete physical exam  Patient: Alice Jones   DOB: Dec 02, 1960   61 y.o. Female  MRN: [id number]  Subjective:    Chief Complaint  Patient presents with   Annual Exam    Non fasting. Ate a chicken filet sandwich and coffee with cream.    Spasms    Mostly hands but elbows and other parts of body. Recent been happening during sleep.    Immunizations    Possibly flu    Thyroid Problem    Endocrinologist was concerned about the feeling of thyroid. Sent for imaging and found nodules on thyroid.     Alice Jones is a 62 y.o. female who presents today for a complete physical exam. She reports consuming a general diet. Home exercise routine includes heavy lifting like wood working and climbing ladders. She generally feels fairly well. She reports trouble falling asleep. She  has seen Dr. Vickey Huger in the past for this.  She has a history of narcolepsy and is using Modafinil with good results.  She is followed by Dr. Romero Belling for her underlying diabetes and is now on a pump as well as using Mounjaro.  Her last hemoglobin A1c was 7.3.  She also recently had thyroid ultrasound which showed nodules that are being monitored.  Her Synthroid dose was reduced to 0.15.  Her allergies seem to be under good control.  In the past she has had desensitization shots but is not interested in doing this again.  She continues on losartan as well as atorvastatin.  She has had a mammogram.  She uses Benadryl at night for her chronic urticaria with good results.  She has had a previous history of cervical spine surgery and is presently not having any difficulties.   Most recent fall risk assessment:    01/11/2023   10:23 AM  Fall Risk   Falls in the past year? 0  Number falls in past yr: 0  Injury with Fall? 0     Most recent depression screenings:    01/11/2023   10:23 AM 01/02/2022   11:20 AM  PHQ 2/9 Scores  PHQ - 2 Score 0 0    Vision:Within last year and Dental: No current dental problems  and Last dental visit: October 2024    Patient Care Team: Ronnald Nian, MD as PCP - General (Family Medicine) Linna Darner, RD as Dietitian [facility])   Outpatient Medications Prior to Visit  Medication Sig   Accu-Chek FastClix Lancets MISC Use as directed finger stick 6 times a day   Blood Glucose Monitoring Suppl (FREESTYLE LITE) w/Device KIT Use as directed to check blood sugar.   Continuous Glucose Sensor (DEXCOM G6 SENSOR) MISC Use 1 sensor every 10 days.   Continuous Glucose Transmitter (DEXCOM G6 TRANSMITTER) MISC Use as directed for 90 days   Continuous Glucose Transmitter (DEXCOM G6 TRANSMITTER) MISC Use as directed   Continuous Glucose Transmitter (DEXCOM G6 TRANSMITTER) MISC Use as directed to monitor blood sugar continuously.   diphenhydrAMINE (BENADRYL) 25 MG tablet Take 12.5-25 mg by mouth at bedtime.    Esomeprazole Magnesium (NEXIUM PO) Take 20 mg by mouth daily.    glucose blood (ACCU-CHEK SMARTVIEW) test strip Check blood sugar once daily   glucose blood (FREESTYLE LITE) test strip Use to check blood sugar 4 (four) times daily.   Insulin Disposable Pump (OMNIPOD 5 G6 INTRO, GEN 5,) KIT Use as directed daily   Insulin Disposable Pump (OMNIPOD 5 G6 PODS, GEN 5,) MISC  Change every 3 days   Insulin Disposable Pump (OMNIPOD 5 G6 PODS, GEN 5,) MISC USE 1 EVERY 3 DAYS   insulin lispro (HUMALOG) 100 UNIT/ML injection Inject 50 Units total per day via pump   insulin lispro (HUMALOG) 100 UNIT/ML injection Inject 1.5 mLs (150 Units total) into the skin daily per pump   insulin lispro (HUMALOG) 100 UNIT/ML injection Use 50 Units daily via insulin pump.   levothyroxine (SYNTHROID) 150 MCG tablet Take 1 tablet (150 mcg total) by mouth in the morning on an empty stomach.   levothyroxine (SYNTHROID) 175 MCG tablet Take 1 tablet (175 mcg total) by mouth in the morning, on an empty stomach   meloxicam (MOBIC) 7.5 MG tablet Take 7.5 mg by mouth daily.   tirzepatide (MOUNJARO)  10 MG/0.5ML Pen Inject 10 mg into the skin once a week.   tirzepatide (MOUNJARO) 10 MG/0.5ML Pen Inject 10 mg into the skin once a week.   [DISCONTINUED] Armodafinil 250 MG tablet Take 1 tablet (250 mg total) by mouth daily.   [DISCONTINUED] atorvastatin (LIPITOR) 20 MG tablet Take 1 tablet (20 mg total) by mouth daily.   [DISCONTINUED] losartan (COZAAR) 25 MG tablet Take 1 tablet (25 mg total) by mouth daily.   cholecalciferol (VITAMIN D) 1000 UNITS tablet Take 1,000 Units by mouth daily. (Patient not taking: Reported on 01/11/2023)   Coenzyme Q10 (COQ10) 100 MG CAPS Take 1 capsule by mouth daily. (Patient not taking: Reported on 01/11/2023)   DYMISTA 137-50 MCG/ACT SUSP Place 1 spray into both nostrils 2 (two) times daily. (Patient not taking: Reported on 01/11/2023)   fish oil-omega-3 fatty acids 1000 MG capsule Take 2 g by mouth daily. (Patient not taking: Reported on 01/11/2023)   Multiple Vitamins-Minerals (MULTIVITAMINS) CHEW Chew 1 tablet by mouth daily. (Patient not taking: Reported on 01/11/2023)   NON FORMULARY Take 1 capsule by mouth daily. Tumeric (Patient not taking: Reported on 01/11/2023)   NON FORMULARY Take 2 capsules by mouth daily. Elysium supplement (Patient not taking: Reported on 01/11/2023)   tirzepatide [facility]) 5 MG/0.5ML Pen Inject 5 mg into the skin once a week. (Patient not taking: Reported on 01/11/2023)   tirzepatide [facility]) 7.5 MG/0.5ML Pen Inject 7.5 mg into the skin once a week. (Patient not taking: Reported on 01/11/2023)   tirzepatide [facility]) 7.5 MG/0.5ML Pen Inject 7.5 mg into the skin once a week. (Patient not taking: Reported on 01/11/2023)   tirzepatide [facility]) 7.5 MG/0.5ML Pen Inject 7.5 mg into the skin once a week. (Patient not taking: Reported on 01/11/2023)   No facility-administered medications prior to visit.    Review of Systems  All other systems reviewed and are negative.  Family and social history as well as health maintenance and  immunizations was reviewed from Dr. Manson Passey office was also reviewed.       Objective:     BP 112/68   Pulse 83   Ht 5' 1.5" (1.562 m)   Wt 163 lb 6.4 oz (74.1 kg)   LMP 02/09/2017 (Exact Date)   SpO2 94%   BMI 30.37 kg/m    Physical Exam  Alert and in no distress. Tympanic membranes and canals are normal. Pharyngeal area is normal. Neck is supple without adenopathy or thyromegaly. Cardiac exam shows a regular sinus rhythm without murmurs or gallops. Lungs are clear to auscultation.      Assessment & Plan:    Routine general medical examination at a health care facility  Seasonal allergic rhinitis due to pollen  Chronic idiopathic urticaria  Angle-closure glaucoma, mild stage  Narcolepsy without cataplexy - Plan: Armodafinil 250 MG tablet  Nocturnal hypoxia  Other specified hypothyroidism  Obesity (BMI 30.0-34.9)  Mixed hyperlipidemia  Type 2 diabetes mellitus with other specified complication, with long-term current use of insulin (HCC)  Multiple thyroid nodules  H/O cervical spine surgery  Hyperlipidemia, unspecified hyperlipidemia type - Plan: atorvastatin (LIPITOR) 20 MG tablet  Type 2 diabetes mellitus without complication, without long-term current use of insulin (HCC) - Plan: losartan (COZAAR) 25 MG tablet  Need for shingles vaccine - Plan: Varicella-zoster vaccine IM  Need for influenza vaccination - Plan: Flu vaccine trivalent PF, 6mos and older(Flulaval,Afluria,Fluarix,Fluzone)  Immunization History  Administered Date(s) Administered   Influenza, Seasonal, Injecte, Preservative Fre 01/11/2023   Influenza,inj,Quad PF,6+ Mos 10/21/2018, 11/17/2019, 11/29/2020, 01/02/2022   PFIZER(Purple Top)SARS-COV-2 Vaccination 03/31/2019, 04/26/2019, 11/03/2019   Pfizer Covid-19 Vaccine Bivalent Booster 42yrs & up 11/29/2020   Pfizer(Comirnaty)Fall Seasonal Vaccine 12 years and older 01/02/2022   Pneumococcal Conjugate-13 11/12/2016   Pneumococcal  Polysaccharide-23 08/12/2018   Tdap 07/20/2010   Zoster Recombinant(Shingrix) 01/11/2023    Health Maintenance  Topic Date Due   FOOT EXAM  Never done   HIV Screening  Never done   COLON CANCER SCREENING ANNUAL FOBT  04/04/2016   DTaP/Tdap/Td (2 - Td or Tdap) 07/19/2020   HEMOGLOBIN A1C  05/09/2021   Cervical Cancer Screening (HPV/Pap Cotest)  10/20/2021   Colonoscopy  10/28/2021   Diabetic kidney evaluation - Urine ACR  11/29/2021   Diabetic kidney evaluation - eGFR measurement  01/03/2023   COVID-19 Vaccine (6 - 2024-25 season) 01/27/2023 (Originally 09/20/2022)   Zoster Vaccines- Shingrix (2 of 2) 03/08/2023   OPHTHALMOLOGY EXAM  04/29/2023   MAMMOGRAM  03/13/2024   INFLUENZA VACCINE  Completed   Hepatitis C Screening  Completed   HPV VACCINES  Aged Out    Discussed health benefits of physical activity, and encouraged her to engage in regular exercise appropriate for her age and condition.  Problem List Items Addressed This Visit     Allergic rhinitis   Chronic idiopathic urticaria   Diabetes (HCC)   Relevant Medications   atorvastatin (LIPITOR) 20 MG tablet   losartan (COZAAR) 25 MG tablet   Glaucoma, narrow-angle   H/O cervical spine surgery   Hyperlipemia   Relevant Medications   atorvastatin (LIPITOR) 20 MG tablet   losartan (COZAAR) 25 MG tablet   Hypothyroid   Narcolepsy without cataplexy   Relevant Medications   Armodafinil 250 MG tablet   Nocturnal hypoxia   Obesity (BMI 30.0-34.9)   Other Visit Diagnoses       Routine general medical examination at a health care facility    -  Primary     Multiple thyroid nodules         Need for shingles vaccine       Relevant Orders   Varicella-zoster vaccine IM (Completed)     Need for influenza vaccination       Relevant Orders   Flu vaccine trivalent PF, 6mos and older(Flulaval,Afluria,Fluarix,Fluzone) (Completed)     Continue on present medication regimen. Follow-up in 1 year.    Sharlot Gowda, MD

## 2023-01-12 ENCOUNTER — Encounter: Payer: Commercial Managed Care - PPO | Admitting: Family Medicine

## 2023-01-29 ENCOUNTER — Other Ambulatory Visit: Payer: Self-pay

## 2023-01-29 ENCOUNTER — Other Ambulatory Visit (HOSPITAL_COMMUNITY): Payer: Self-pay

## 2023-02-05 ENCOUNTER — Other Ambulatory Visit: Payer: Self-pay

## 2023-02-05 ENCOUNTER — Other Ambulatory Visit (HOSPITAL_COMMUNITY): Payer: Self-pay

## 2023-02-26 DIAGNOSIS — E039 Hypothyroidism, unspecified: Secondary | ICD-10-CM | POA: Diagnosis not present

## 2023-03-03 ENCOUNTER — Other Ambulatory Visit: Payer: Self-pay

## 2023-03-03 ENCOUNTER — Other Ambulatory Visit (HOSPITAL_COMMUNITY): Payer: Self-pay

## 2023-03-03 MED ORDER — INSULIN LISPRO 100 UNIT/ML IJ SOLN
50.0000 [IU] | Freq: Every day | INTRAMUSCULAR | 5 refills | Status: AC
  Filled 2023-03-03: qty 40, 80d supply, fill #0
  Filled 2023-05-18: qty 40, 80d supply, fill #1
  Filled 2023-09-16 – 2023-09-18 (×2): qty 40, 80d supply, fill #2

## 2023-03-03 MED ORDER — DEXCOM G7 SENSOR MISC
5 refills | Status: AC
  Filled 2023-03-03: qty 9, 90d supply, fill #0
  Filled 2023-06-04: qty 3, 30d supply, fill #1
  Filled 2023-06-08: qty 3, 30d supply, fill #0
  Filled 2023-06-08: qty 9, 90d supply, fill #1
  Filled 2023-06-28 – 2023-07-02 (×2): qty 3, 30d supply, fill #1
  Filled 2023-08-02: qty 3, 30d supply, fill #2
  Filled 2023-09-04 (×2): qty 3, 30d supply, fill #3
  Filled 2023-10-06 – 2023-10-08 (×2): qty 3, 30d supply, fill #4
  Filled 2023-11-10 – 2023-11-11 (×2): qty 3, 30d supply, fill #5

## 2023-03-16 ENCOUNTER — Encounter: Payer: Self-pay | Admitting: Internal Medicine

## 2023-04-15 ENCOUNTER — Other Ambulatory Visit (HOSPITAL_COMMUNITY): Payer: Self-pay

## 2023-05-01 ENCOUNTER — Other Ambulatory Visit (HOSPITAL_COMMUNITY): Payer: Self-pay

## 2023-05-03 ENCOUNTER — Other Ambulatory Visit (HOSPITAL_COMMUNITY): Payer: Self-pay

## 2023-05-03 MED ORDER — OMNIPOD 5 DEXG7G6 PODS GEN 5 MISC
5 refills | Status: DC
  Filled 2023-05-03: qty 10, 30d supply, fill #0
  Filled 2023-05-26: qty 10, 30d supply, fill #1

## 2023-05-17 ENCOUNTER — Other Ambulatory Visit (HOSPITAL_COMMUNITY): Payer: Self-pay

## 2023-05-18 ENCOUNTER — Other Ambulatory Visit (HOSPITAL_COMMUNITY): Payer: Self-pay

## 2023-05-18 ENCOUNTER — Other Ambulatory Visit: Payer: Self-pay

## 2023-05-18 MED ORDER — MOUNJARO 10 MG/0.5ML ~~LOC~~ SOAJ
10.0000 mg | SUBCUTANEOUS | 5 refills | Status: AC
  Filled 2023-05-18: qty 6, 84d supply, fill #0
  Filled 2023-08-23: qty 6, 84d supply, fill #1

## 2023-05-26 ENCOUNTER — Other Ambulatory Visit (HOSPITAL_COMMUNITY): Payer: Self-pay

## 2023-05-26 ENCOUNTER — Other Ambulatory Visit: Payer: Self-pay | Admitting: Family Medicine

## 2023-05-26 MED ORDER — OMNIPOD 5 DEXG7G6 PODS GEN 5 MISC
5 refills | Status: DC
  Filled 2023-05-26: qty 10, 30d supply, fill #0
  Filled 2023-05-27: qty 5, 15d supply, fill #0
  Filled 2023-06-08: qty 5, 15d supply, fill #1
  Filled 2023-06-28: qty 5, 15d supply, fill #2

## 2023-05-27 ENCOUNTER — Other Ambulatory Visit: Payer: Self-pay

## 2023-05-29 ENCOUNTER — Other Ambulatory Visit (HOSPITAL_COMMUNITY): Payer: Self-pay

## 2023-06-07 ENCOUNTER — Other Ambulatory Visit (HOSPITAL_COMMUNITY): Payer: Self-pay

## 2023-06-08 ENCOUNTER — Other Ambulatory Visit (HOSPITAL_COMMUNITY): Payer: Self-pay

## 2023-06-08 ENCOUNTER — Other Ambulatory Visit: Payer: Self-pay

## 2023-06-11 ENCOUNTER — Encounter: Payer: Self-pay | Admitting: Endocrinology

## 2023-06-11 ENCOUNTER — Other Ambulatory Visit (HOSPITAL_COMMUNITY): Payer: Self-pay

## 2023-06-11 DIAGNOSIS — H40033 Anatomical narrow angle, bilateral: Secondary | ICD-10-CM | POA: Diagnosis not present

## 2023-06-11 DIAGNOSIS — H11823 Conjunctivochalasis, bilateral: Secondary | ICD-10-CM | POA: Diagnosis not present

## 2023-06-11 DIAGNOSIS — H2513 Age-related nuclear cataract, bilateral: Secondary | ICD-10-CM | POA: Diagnosis not present

## 2023-06-11 DIAGNOSIS — E119 Type 2 diabetes mellitus without complications: Secondary | ICD-10-CM | POA: Diagnosis not present

## 2023-06-11 LAB — HM DIABETES EYE EXAM

## 2023-06-28 ENCOUNTER — Other Ambulatory Visit (HOSPITAL_COMMUNITY): Payer: Self-pay

## 2023-06-28 ENCOUNTER — Other Ambulatory Visit: Payer: Self-pay | Admitting: Family Medicine

## 2023-06-28 ENCOUNTER — Other Ambulatory Visit: Payer: Self-pay

## 2023-06-28 MED ORDER — OMNIPOD 5 DEXG7G6 PODS GEN 5 MISC
5 refills | Status: AC
  Filled 2023-06-28: qty 5, 15d supply, fill #0
  Filled 2023-07-15: qty 5, 15d supply, fill #1

## 2023-07-02 ENCOUNTER — Other Ambulatory Visit (HOSPITAL_COMMUNITY): Payer: Self-pay

## 2023-07-02 ENCOUNTER — Other Ambulatory Visit: Payer: Self-pay

## 2023-07-02 DIAGNOSIS — E7800 Pure hypercholesterolemia, unspecified: Secondary | ICD-10-CM | POA: Diagnosis not present

## 2023-07-02 DIAGNOSIS — R252 Cramp and spasm: Secondary | ICD-10-CM | POA: Diagnosis not present

## 2023-07-02 DIAGNOSIS — E1165 Type 2 diabetes mellitus with hyperglycemia: Secondary | ICD-10-CM | POA: Diagnosis not present

## 2023-07-02 DIAGNOSIS — E039 Hypothyroidism, unspecified: Secondary | ICD-10-CM | POA: Diagnosis not present

## 2023-07-02 DIAGNOSIS — Z9641 Presence of insulin pump (external) (internal): Secondary | ICD-10-CM | POA: Diagnosis not present

## 2023-07-02 MED ORDER — MOUNJARO 12.5 MG/0.5ML ~~LOC~~ SOAJ
12.5000 mg | SUBCUTANEOUS | 5 refills | Status: AC
  Filled 2023-07-02: qty 6, 84d supply, fill #0
  Filled 2023-08-23: qty 2, 28d supply, fill #0
  Filled 2023-09-15 – 2023-09-18 (×4): qty 2, 28d supply, fill #1
  Filled 2023-10-21: qty 2, 28d supply, fill #2
  Filled 2023-10-26: qty 2, 28d supply, fill #0

## 2023-07-02 MED ORDER — OMNIPOD 5 DEXG7G6 PODS GEN 5 MISC
5 refills | Status: AC
  Filled 2023-07-02 – 2023-08-02 (×2): qty 45, 90d supply, fill #0

## 2023-07-15 ENCOUNTER — Other Ambulatory Visit: Payer: Self-pay | Admitting: Family Medicine

## 2023-07-15 ENCOUNTER — Other Ambulatory Visit (HOSPITAL_COMMUNITY): Payer: Self-pay

## 2023-07-15 ENCOUNTER — Encounter (HOSPITAL_COMMUNITY): Payer: Self-pay

## 2023-07-15 ENCOUNTER — Other Ambulatory Visit: Payer: Self-pay

## 2023-07-15 DIAGNOSIS — E119 Type 2 diabetes mellitus without complications: Secondary | ICD-10-CM

## 2023-07-15 MED ORDER — LEVOTHYROXINE SODIUM 150 MCG PO TABS
150.0000 ug | ORAL_TABLET | Freq: Every morning | ORAL | 5 refills | Status: AC
  Filled 2023-07-15 – 2023-07-16 (×2): qty 30, 30d supply, fill #0
  Filled 2023-08-23: qty 30, 30d supply, fill #1
  Filled 2023-09-15 – 2023-09-18 (×2): qty 30, 30d supply, fill #2
  Filled 2023-10-21: qty 30, 30d supply, fill #3

## 2023-07-15 MED ORDER — LOSARTAN POTASSIUM 25 MG PO TABS
25.0000 mg | ORAL_TABLET | Freq: Every day | ORAL | 0 refills | Status: DC
  Filled 2023-07-15 – 2023-07-16 (×2): qty 90, 90d supply, fill #0

## 2023-07-16 ENCOUNTER — Other Ambulatory Visit (HOSPITAL_COMMUNITY): Payer: Self-pay

## 2023-07-17 ENCOUNTER — Other Ambulatory Visit (HOSPITAL_COMMUNITY): Payer: Self-pay

## 2023-07-19 ENCOUNTER — Other Ambulatory Visit (HOSPITAL_COMMUNITY): Payer: Self-pay

## 2023-08-02 ENCOUNTER — Other Ambulatory Visit (HOSPITAL_COMMUNITY): Payer: Self-pay

## 2023-08-03 ENCOUNTER — Other Ambulatory Visit (HOSPITAL_COMMUNITY): Payer: Self-pay

## 2023-08-23 ENCOUNTER — Other Ambulatory Visit: Payer: Self-pay | Admitting: Family Medicine

## 2023-08-23 ENCOUNTER — Other Ambulatory Visit (HOSPITAL_COMMUNITY): Payer: Self-pay

## 2023-08-23 DIAGNOSIS — G47419 Narcolepsy without cataplexy: Secondary | ICD-10-CM

## 2023-08-23 MED ORDER — ARMODAFINIL 250 MG PO TABS
250.0000 mg | ORAL_TABLET | Freq: Every day | ORAL | 1 refills | Status: DC
  Filled 2023-08-23: qty 90, 90d supply, fill #0
  Filled 2023-11-24: qty 90, 90d supply, fill #1

## 2023-08-23 NOTE — Telephone Encounter (Signed)
 Is this okay to refill?

## 2023-08-24 ENCOUNTER — Other Ambulatory Visit (HOSPITAL_COMMUNITY): Payer: Self-pay

## 2023-09-04 ENCOUNTER — Other Ambulatory Visit (HOSPITAL_COMMUNITY): Payer: Self-pay

## 2023-09-15 ENCOUNTER — Other Ambulatory Visit (HOSPITAL_COMMUNITY): Payer: Self-pay

## 2023-09-18 ENCOUNTER — Other Ambulatory Visit (HOSPITAL_COMMUNITY): Payer: Self-pay

## 2023-09-21 ENCOUNTER — Other Ambulatory Visit (HOSPITAL_COMMUNITY): Payer: Self-pay

## 2023-10-06 ENCOUNTER — Other Ambulatory Visit: Payer: Self-pay

## 2023-10-08 ENCOUNTER — Other Ambulatory Visit (HOSPITAL_COMMUNITY): Payer: Self-pay

## 2023-10-08 ENCOUNTER — Other Ambulatory Visit: Payer: Self-pay

## 2023-10-21 ENCOUNTER — Other Ambulatory Visit: Payer: Self-pay | Admitting: Family Medicine

## 2023-10-21 DIAGNOSIS — E119 Type 2 diabetes mellitus without complications: Secondary | ICD-10-CM

## 2023-10-22 ENCOUNTER — Other Ambulatory Visit: Payer: Self-pay

## 2023-10-22 ENCOUNTER — Other Ambulatory Visit (HOSPITAL_COMMUNITY): Payer: Self-pay

## 2023-10-22 MED ORDER — LOSARTAN POTASSIUM 25 MG PO TABS
25.0000 mg | ORAL_TABLET | Freq: Every day | ORAL | 0 refills | Status: DC
  Filled 2023-10-22: qty 90, 90d supply, fill #0

## 2023-10-26 ENCOUNTER — Other Ambulatory Visit (HOSPITAL_COMMUNITY): Payer: Self-pay

## 2023-11-05 DIAGNOSIS — E039 Hypothyroidism, unspecified: Secondary | ICD-10-CM | POA: Diagnosis not present

## 2023-11-05 DIAGNOSIS — E7800 Pure hypercholesterolemia, unspecified: Secondary | ICD-10-CM | POA: Diagnosis not present

## 2023-11-05 DIAGNOSIS — Z23 Encounter for immunization: Secondary | ICD-10-CM | POA: Diagnosis not present

## 2023-11-05 DIAGNOSIS — Z9641 Presence of insulin pump (external) (internal): Secondary | ICD-10-CM | POA: Diagnosis not present

## 2023-11-05 DIAGNOSIS — E1165 Type 2 diabetes mellitus with hyperglycemia: Secondary | ICD-10-CM | POA: Diagnosis not present

## 2023-11-09 ENCOUNTER — Other Ambulatory Visit (HOSPITAL_COMMUNITY): Payer: Self-pay

## 2023-11-09 MED ORDER — LEVOTHYROXINE SODIUM 137 MCG PO TABS
137.0000 ug | ORAL_TABLET | Freq: Every day | ORAL | 5 refills | Status: AC
  Filled 2023-11-09: qty 30, 30d supply, fill #0
  Filled 2023-11-11 – 2024-02-04 (×2): qty 90, 90d supply, fill #0

## 2023-11-10 ENCOUNTER — Other Ambulatory Visit: Payer: Self-pay

## 2023-11-11 ENCOUNTER — Other Ambulatory Visit (HOSPITAL_COMMUNITY): Payer: Self-pay

## 2023-11-11 ENCOUNTER — Other Ambulatory Visit: Payer: Self-pay

## 2023-11-24 ENCOUNTER — Other Ambulatory Visit (HOSPITAL_COMMUNITY): Payer: Self-pay

## 2023-12-03 ENCOUNTER — Other Ambulatory Visit (HOSPITAL_COMMUNITY): Payer: Self-pay

## 2023-12-03 ENCOUNTER — Other Ambulatory Visit: Payer: Self-pay

## 2023-12-03 DIAGNOSIS — E1165 Type 2 diabetes mellitus with hyperglycemia: Secondary | ICD-10-CM | POA: Diagnosis not present

## 2023-12-03 DIAGNOSIS — E039 Hypothyroidism, unspecified: Secondary | ICD-10-CM | POA: Diagnosis not present

## 2023-12-03 DIAGNOSIS — E7800 Pure hypercholesterolemia, unspecified: Secondary | ICD-10-CM | POA: Diagnosis not present

## 2023-12-03 DIAGNOSIS — Z9641 Presence of insulin pump (external) (internal): Secondary | ICD-10-CM | POA: Diagnosis not present

## 2023-12-03 MED ORDER — INSULIN LISPRO 100 UNIT/ML IJ SOLN
100.0000 [IU] | Freq: Every day | INTRAMUSCULAR | 5 refills | Status: AC
  Filled 2023-12-03: qty 90, 90d supply, fill #0

## 2023-12-03 MED ORDER — ATORVASTATIN CALCIUM 20 MG PO TABS
20.0000 mg | ORAL_TABLET | Freq: Every day | ORAL | 3 refills | Status: AC
  Filled 2023-12-03 – 2023-12-22 (×2): qty 90, 90d supply, fill #0

## 2023-12-03 MED ORDER — MOUNJARO 12.5 MG/0.5ML ~~LOC~~ SOAJ
12.5000 mg | SUBCUTANEOUS | 5 refills | Status: AC
  Filled 2023-12-03: qty 6, 84d supply, fill #0

## 2023-12-03 MED ORDER — DEXCOM G7 SENSOR MISC
1.0000 [IU] | 5 refills | Status: AC
  Filled 2023-12-03 – 2023-12-13 (×2): qty 9, 90d supply, fill #0

## 2023-12-03 MED ORDER — LEVOTHYROXINE SODIUM 137 MCG PO TABS
137.0000 ug | ORAL_TABLET | Freq: Every morning | ORAL | 5 refills | Status: AC
  Filled 2023-12-03: qty 90, 90d supply, fill #0

## 2023-12-03 MED ORDER — LOSARTAN POTASSIUM 25 MG PO TABS
25.0000 mg | ORAL_TABLET | Freq: Every day | ORAL | 5 refills | Status: AC
  Filled 2023-12-03 – 2024-01-23 (×2): qty 90, 90d supply, fill #0

## 2023-12-03 MED ORDER — OMNIPOD 5 DEXG7G6 PODS GEN 5 MISC
1.0000 [IU] | 5 refills | Status: AC
  Filled 2023-12-03: qty 45, 90d supply, fill #0

## 2023-12-06 ENCOUNTER — Other Ambulatory Visit: Payer: Self-pay | Admitting: Endocrinology

## 2023-12-06 DIAGNOSIS — E010 Iodine-deficiency related diffuse (endemic) goiter: Secondary | ICD-10-CM

## 2023-12-13 ENCOUNTER — Other Ambulatory Visit (HOSPITAL_COMMUNITY): Payer: Self-pay

## 2023-12-20 ENCOUNTER — Ambulatory Visit
Admission: RE | Admit: 2023-12-20 | Discharge: 2023-12-20 | Disposition: A | Source: Ambulatory Visit | Attending: Endocrinology | Admitting: Endocrinology

## 2023-12-20 DIAGNOSIS — E042 Nontoxic multinodular goiter: Secondary | ICD-10-CM | POA: Diagnosis not present

## 2023-12-20 DIAGNOSIS — E010 Iodine-deficiency related diffuse (endemic) goiter: Secondary | ICD-10-CM

## 2023-12-22 ENCOUNTER — Other Ambulatory Visit (HOSPITAL_COMMUNITY): Payer: Self-pay

## 2024-01-23 ENCOUNTER — Other Ambulatory Visit (HOSPITAL_COMMUNITY): Payer: Self-pay

## 2024-02-04 ENCOUNTER — Other Ambulatory Visit: Payer: Self-pay | Admitting: Family Medicine

## 2024-02-04 ENCOUNTER — Other Ambulatory Visit (HOSPITAL_COMMUNITY): Payer: Self-pay

## 2024-02-04 DIAGNOSIS — G47419 Narcolepsy without cataplexy: Secondary | ICD-10-CM

## 2024-02-05 MED ORDER — ARMODAFINIL 250 MG PO TABS
250.0000 mg | ORAL_TABLET | Freq: Every day | ORAL | 1 refills | Status: AC
  Filled 2024-02-05 – 2024-02-11 (×2): qty 90, 90d supply, fill #0

## 2024-02-06 ENCOUNTER — Other Ambulatory Visit (HOSPITAL_COMMUNITY): Payer: Self-pay

## 2024-02-07 ENCOUNTER — Other Ambulatory Visit: Payer: Self-pay

## 2024-02-07 ENCOUNTER — Other Ambulatory Visit (HOSPITAL_COMMUNITY): Payer: Self-pay

## 2024-02-08 ENCOUNTER — Other Ambulatory Visit: Payer: Self-pay

## 2024-02-11 ENCOUNTER — Other Ambulatory Visit (HOSPITAL_COMMUNITY): Payer: Self-pay

## 2024-02-22 ENCOUNTER — Encounter: Payer: Self-pay | Admitting: Family Medicine

## 2024-02-22 ENCOUNTER — Telehealth: Payer: Self-pay

## 2024-02-22 ENCOUNTER — Ambulatory Visit: Admitting: Family Medicine

## 2024-02-22 ENCOUNTER — Encounter: Payer: Commercial Managed Care - PPO | Admitting: Family Medicine

## 2024-02-22 VITALS — BP 126/76 | HR 65 | Ht 62.0 in | Wt 166.0 lb

## 2024-02-22 DIAGNOSIS — E042 Nontoxic multinodular goiter: Secondary | ICD-10-CM | POA: Diagnosis not present

## 2024-02-22 DIAGNOSIS — H4020X1 Unspecified primary angle-closure glaucoma, mild stage: Secondary | ICD-10-CM

## 2024-02-22 DIAGNOSIS — G47419 Narcolepsy without cataplexy: Secondary | ICD-10-CM

## 2024-02-22 DIAGNOSIS — E785 Hyperlipidemia, unspecified: Secondary | ICD-10-CM | POA: Diagnosis not present

## 2024-02-22 DIAGNOSIS — G4734 Idiopathic sleep related nonobstructive alveolar hypoventilation: Secondary | ICD-10-CM | POA: Diagnosis not present

## 2024-02-22 DIAGNOSIS — Z794 Long term (current) use of insulin: Secondary | ICD-10-CM

## 2024-02-22 DIAGNOSIS — E66811 Obesity, class 1: Secondary | ICD-10-CM | POA: Diagnosis not present

## 2024-02-22 DIAGNOSIS — Z23 Encounter for immunization: Secondary | ICD-10-CM

## 2024-02-22 DIAGNOSIS — L501 Idiopathic urticaria: Secondary | ICD-10-CM | POA: Diagnosis not present

## 2024-02-22 DIAGNOSIS — E1169 Type 2 diabetes mellitus with other specified complication: Secondary | ICD-10-CM | POA: Diagnosis not present

## 2024-02-22 DIAGNOSIS — E038 Other specified hypothyroidism: Secondary | ICD-10-CM

## 2024-02-22 DIAGNOSIS — Z0000 Encounter for general adult medical examination without abnormal findings: Secondary | ICD-10-CM | POA: Diagnosis not present

## 2024-02-22 DIAGNOSIS — Z1211 Encounter for screening for malignant neoplasm of colon: Secondary | ICD-10-CM | POA: Diagnosis not present

## 2024-02-22 DIAGNOSIS — E119 Type 2 diabetes mellitus without complications: Secondary | ICD-10-CM

## 2024-02-22 NOTE — Telephone Encounter (Signed)
 Patient is requesting to change to you for her PCP since Dr. Joyce is retiring. She would rather have a female provider.
